# Patient Record
Sex: Female | Born: 1955 | Race: White | Hispanic: Yes | Marital: Single | Smoking: Former smoker
Health system: Southern US, Community
[De-identification: ages and names within clinical notes are randomized; demographics above are authoritative.]

## PROBLEM LIST (undated history)

## (undated) DIAGNOSIS — Z8619 Personal history of other infectious and parasitic diseases: Secondary | ICD-10-CM

## (undated) DIAGNOSIS — E785 Hyperlipidemia, unspecified: Secondary | ICD-10-CM

## (undated) DIAGNOSIS — I10 Essential (primary) hypertension: Secondary | ICD-10-CM

## (undated) DIAGNOSIS — R112 Nausea with vomiting, unspecified: Secondary | ICD-10-CM

## (undated) DIAGNOSIS — E079 Disorder of thyroid, unspecified: Secondary | ICD-10-CM

## (undated) DIAGNOSIS — Z9889 Other specified postprocedural states: Secondary | ICD-10-CM

## (undated) HISTORY — DX: Disorder of thyroid, unspecified: E07.9

## (undated) HISTORY — DX: Hyperlipidemia, unspecified: E78.5

## (undated) HISTORY — PX: CHOLECYSTECTOMY: SHX55

## (undated) HISTORY — PX: ABDOMINAL HYSTERECTOMY: SHX81

## (undated) HISTORY — DX: Personal history of other infectious and parasitic diseases: Z86.19

---

## 2005-03-13 ENCOUNTER — Emergency Department (HOSPITAL_COMMUNITY): Admission: EM | Admit: 2005-03-13 | Discharge: 2005-03-13 | Payer: Self-pay | Admitting: Emergency Medicine

## 2006-03-06 ENCOUNTER — Emergency Department (HOSPITAL_COMMUNITY): Admission: EM | Admit: 2006-03-06 | Discharge: 2006-03-06 | Payer: Self-pay | Admitting: Emergency Medicine

## 2006-03-30 ENCOUNTER — Ambulatory Visit: Payer: Self-pay | Admitting: Family Medicine

## 2006-03-31 ENCOUNTER — Ambulatory Visit: Payer: Self-pay | Admitting: *Deleted

## 2006-06-04 ENCOUNTER — Ambulatory Visit: Payer: Self-pay | Admitting: Family Medicine

## 2006-06-08 ENCOUNTER — Ambulatory Visit (HOSPITAL_COMMUNITY): Admission: RE | Admit: 2006-06-08 | Discharge: 2006-06-08 | Payer: Self-pay | Admitting: Family Medicine

## 2006-10-27 ENCOUNTER — Encounter (INDEPENDENT_AMBULATORY_CARE_PROVIDER_SITE_OTHER): Payer: Self-pay | Admitting: *Deleted

## 2011-07-22 ENCOUNTER — Ambulatory Visit (INDEPENDENT_AMBULATORY_CARE_PROVIDER_SITE_OTHER): Payer: Self-pay | Admitting: Family Medicine

## 2011-07-22 ENCOUNTER — Encounter: Payer: Self-pay | Admitting: Family Medicine

## 2011-07-22 VITALS — BP 119/82 | HR 83 | Temp 97.8°F | Wt 158.1 lb

## 2011-07-22 DIAGNOSIS — E1165 Type 2 diabetes mellitus with hyperglycemia: Secondary | ICD-10-CM | POA: Insufficient documentation

## 2011-07-22 DIAGNOSIS — E785 Hyperlipidemia, unspecified: Secondary | ICD-10-CM | POA: Insufficient documentation

## 2011-07-22 DIAGNOSIS — E119 Type 2 diabetes mellitus without complications: Secondary | ICD-10-CM | POA: Insufficient documentation

## 2011-07-22 NOTE — Assessment & Plan Note (Signed)
Diet and exercise for now until receives [facility] card, so I can order A1c. Will likely restart Metformin and Glipizide 10 mg if still elevated.

## 2011-07-22 NOTE — Progress Notes (Signed)
  Subjective:    Patient ID: Brittany Pineda, female    DOB: [birth year], 56 y.o.   MRN: [id number]  HPI  Patient presents to clinic to establish care.  She is Spanish speaking only - interview conducted with Spanish interpreter.  Her daughter is patient here.  Patient was last seen by a doctor about 6 months ago.  She was taking medication for DM, type 2 and HLD, but stopped taking all medications 4 months ago.  She would not elaborate on the reason.  Patient complains of tingling sensation in both feet, but no decreased sensation.  Denies any HA, nausea/vomiting, chest pain, or blurry vision.  Spanish interpreter, Brittany Pineda, discussed Brittany Pineda process with patient.  Patient understands that she will have to wait to have lab work done until next visit.    Review of Systems     Objective:   Physical Exam  Constitutional: No distress.  HENT:  Head: Normocephalic and atraumatic.  Neck: Normal range of motion. Neck supple.  Cardiovascular: Normal rate and regular rhythm.   No murmur heard. Pulmonary/Chest: Effort normal and breath sounds normal. She has no wheezes. She has no rales.  Musculoskeletal: Normal range of motion. She exhibits no edema.       Normal sensation and pin-prick exam.  No ulcers.          Assessment & Plan:

## 2011-07-22 NOTE — Assessment & Plan Note (Signed)
Continue Pravastatin 40 mg.  Lipid panel at next visit.

## 2011-07-22 NOTE — Patient Instructions (Addendum)
Please meet Brittany Pineda for orange card. Once you receive your orange card, call [phone] and schedule an appointment with Dr. Tye Savoy. Schedule an appointment in the morning. Do not eat or drink 12 hours prior to your appointment and lab work. Continue Pravastatin 40 mg daily. Avoid foods high in carbohydrates and sugars and increase exercise.  Basic Carbohydrate Counting Basic carbohydrate counting is a way to plan meals. It is done by counting the amount of carbohydrate in foods. Eating carbohydrates increases blood glucose (sugar) levels. People with diabetes use carbohydrate counting to help keep their blood glucose at a normal level.  Foods that have carbohydrates are starches (grains, beans, starchy vegetables) and sweets.  COUNTING CARBOHYDRATES IN FOODS The first step in counting carbohydrates is to learn how many carbohydrate servings you should have in every meal. A dietitian can plan this for you. After learning the amount of carbohydrates to include in your meal plan, you can start to choose the carbohydrate-containing foods you want to eat.  There are 2 ways to identify the amount of carbohydrates in the foods you eat.  Read the Nutrition Facts panel on food labels. All you need are 2 pieces of information from the Nutrition Facts panel to count carbohydrates this way:   Serving size.   Total carbohydrate (in grams).  Decide how many servings you will be eating. If it is 1 serving, you will be eating the amount of carbohydrate listed on the panel. If you will be eating 2 servings, you will be eating double the amount of carbohydrate listed on the panel.   Learn serving sizes. A serving size of most carbohydrate-containing foods is about 15 g. Listed below are serving sizes of common carbohydrate-containing foods:   1 slice bread.    cup unsweetened, dry cereal.    cup hot cereal.   ? cup rice.    cup mashed potatoes.   ? cup pasta.   1 cup fresh fruit.    cup  canned fruit.   1 cup milk (whole, 2%, or skim).    cup starchy vegetables (peas, corn, or potatoes).  Counting carbohydrates this way is similar to looking on the Nutrition Facts panel. Decide how many servings you will eat first. Multiply the number of servings you eat by 15 g. For example, if you have 2 cups of strawberries, you had 2 servings. That means you had 30 g of carbohydrate (2 servings x 15 g = 30 g). CALCULATING CARBOHYDRATES IN A MEAL Sample dinner  3 oz chicken breast.   ? cup brown rice.    cup corn.   1 cup fat-free milk.   1 cup strawberries with sugar-free whipped topping.  Carbohydrate calculation First, identify the foods that contain carbohydrate:  Rice.   Corn.   Milk.   Strawberries.  Calculate the number of servings eaten:  2 servings rice.   1 serving corn.   1 serving milk.   1 serving strawberries.  Multiply the number of servings by 15 g:  2 servings rice x 15 g = 30 g.   1 serving corn x 15 g = 15 g.   1 serving milk x 15 g = 15 g.   1 serving strawberries x 15 g = 15 g.  Add the amounts to find the total carbohydrates eaten: 30 g + 15 g + 15 g + 15 g = 75 g carbohydrate eaten at dinner. Document Released: 01/26/2005 Document Revised: 01/15/2011 Document Reviewed: 12/12/2010 ExitCare  Patient Information 2012 [employer]. 

## 2011-07-29 ENCOUNTER — Other Ambulatory Visit: Payer: Self-pay

## 2011-07-29 DIAGNOSIS — E119 Type 2 diabetes mellitus without complications: Secondary | ICD-10-CM

## 2011-07-29 LAB — COMPREHENSIVE METABOLIC PANEL
ALT: 19 U/L (ref 0–35)
AST: 32 U/L (ref 0–37)
Albumin: 4.1 g/dL (ref 3.5–5.2)
CO2: 25 mEq/L (ref 19–32)
Calcium: 9 mg/dL (ref 8.4–10.5)
Chloride: 97 mEq/L (ref 96–112)
Potassium: 4.5 mEq/L (ref 3.5–5.3)

## 2011-07-29 LAB — LIPID PANEL: Cholesterol: 295 mg/dL — ABNORMAL HIGH (ref 0–200)

## 2011-07-29 LAB — TSH: TSH: 64.792 u[IU]/mL — ABNORMAL HIGH (ref 0.350–4.500)

## 2011-07-29 NOTE — Progress Notes (Signed)
Cmp,flp,and tsh done today Brittany Pineda

## 2011-07-30 ENCOUNTER — Telehealth: Payer: Self-pay | Admitting: Family Medicine

## 2011-07-30 NOTE — Telephone Encounter (Signed)
Called pt and she does not speak english, left message with her daughter erica that Dr.de la Sondra Come would like for her to come in to discuss her lab results. appt made for 08/10/11 @ 245 pm. Will schedule an interpreter for her.Loralee Pacas [place]

## 2011-07-30 NOTE — Telephone Encounter (Signed)
Please call patient and schedule appointment with me to discuss recent blood work as soon as possible.  Thank you.

## 2011-07-31 ENCOUNTER — Telehealth: Payer: Self-pay | Admitting: Family Medicine

## 2011-07-31 DIAGNOSIS — R7989 Other specified abnormal findings of blood chemistry: Secondary | ICD-10-CM

## 2011-07-31 NOTE — Telephone Encounter (Signed)
Future orders for lab prior to visit with me 7/1

## 2011-07-31 NOTE — Telephone Encounter (Signed)
I called pt and she is aware about appt on 07/01 with Dr. Tye Savoy [place]

## 2011-08-10 ENCOUNTER — Ambulatory Visit (INDEPENDENT_AMBULATORY_CARE_PROVIDER_SITE_OTHER): Payer: Self-pay | Admitting: Family Medicine

## 2011-08-10 VITALS — BP 122/79 | HR 90 | Temp 98.7°F | Wt 159.3 lb

## 2011-08-10 DIAGNOSIS — E0590 Thyrotoxicosis, unspecified without thyrotoxic crisis or storm: Secondary | ICD-10-CM | POA: Insufficient documentation

## 2011-08-10 DIAGNOSIS — K921 Melena: Secondary | ICD-10-CM | POA: Insufficient documentation

## 2011-08-10 DIAGNOSIS — E119 Type 2 diabetes mellitus without complications: Secondary | ICD-10-CM

## 2011-08-10 DIAGNOSIS — R7309 Other abnormal glucose: Secondary | ICD-10-CM

## 2011-08-10 DIAGNOSIS — E785 Hyperlipidemia, unspecified: Secondary | ICD-10-CM

## 2011-08-10 DIAGNOSIS — R739 Hyperglycemia, unspecified: Secondary | ICD-10-CM

## 2011-08-10 LAB — POCT GLYCOSYLATED HEMOGLOBIN (HGB A1C): Hemoglobin A1C: >14

## 2011-08-10 MED ORDER — GLIPIZIDE ER 10 MG PO TB24: 10.0000 mg | ORAL_TABLET | Freq: Every day | ORAL | Status: DC

## 2011-08-10 MED ORDER — LEVOTHYROXINE SODIUM 150 MCG PO TABS: 150.0000 ug | ORAL_TABLET | Freq: Every day | ORAL | Status: DC

## 2011-08-10 MED ORDER — METFORMIN HCL 1000 MG PO TABS: 1000.0000 mg | ORAL_TABLET | Freq: Two times a day (BID) | ORAL | Status: DC

## 2011-08-10 NOTE — Patient Instructions (Addendum)
For diabetes, start taking Metformin 1000 mg twice daily and Glipizide 10 mg daily. For high cholesterol, take Pravastatin 1 tablet daily. For hyperthyroidism, start taking Levothyroxine 150 mcg daily. Schedule follow up appointment with me in 6 weeks.

## 2011-08-10 NOTE — Progress Notes (Signed)
  Subjective:    Patient ID: ANNTOINETTE HAEFELE, female    DOB: 1956/01/09, 56 y.o.   MRN: [id number]  HPI  Patient presents to clinic to discuss recent results of blood work.  History translated via Spanish interpreter.  Patient recently had TSH, lipid panel, CMET obtained which showed an elevated TSH, glucose, and elevated TC and TG.  Patient states she was diagnosed with thyroid disorder and DM in the past.  She still has pill bottles half full of Metformin, an empty Glipizide bottle, and half full Pravastatin bottle.  She does not have Synthroid with her, but thinks she was taking either 150 or 175 mcg daily.   Patient stopped taking all medications about 5 months ago, because she did not want to take them anymore.  She does not elaborate on the subject.  She denies any fatigue, CP, SOB, HA, blurry vision.  She does endorses tingling of the bottom of her feet bilaterally.     Review of Systems  Per HPI    Objective:   Physical Exam   Exam deferred at this time. Discussed lab results and counseled patient on DM management for 15 minutes total.       Assessment & Plan:

## 2011-08-10 NOTE — Assessment & Plan Note (Signed)
Continue Pravastatin 40 mg daily  

## 2011-08-10 NOTE — Assessment & Plan Note (Signed)
Restart Levothyroxine 150 mcg (she think this was her dosage before she ran out). Recheck TSH in 6 weeks at annual CPE.

## 2011-08-10 NOTE — Progress Notes (Signed)
Interpreter Areal Cochrane Namihira for Dr de la Cruz  

## 2011-08-10 NOTE — Assessment & Plan Note (Signed)
She has had bloody stools in the past, not currently.  She is due for a screening colonoscopy.  Will refer to [facility].  In the meantime, will obtain FOBT at next visit/physical next month.

## 2011-08-10 NOTE — Assessment & Plan Note (Addendum)
Encouraged patient to restart Metformin 1000 BID and Glipizide 10 XL daily. A1c today was > 14; patient has been off oral hypoglycemics for about 5 months now. Will recheck A1c in 3 months to see if any improvement with medications. Will likely be starting insulin at that time and refer to DM education.

## 2011-12-21 ENCOUNTER — Encounter: Payer: Self-pay | Admitting: Home Health Services

## 2011-12-22 ENCOUNTER — Encounter: Payer: Self-pay | Admitting: Home Health Services

## 2012-08-25 ENCOUNTER — Encounter: Payer: Self-pay | Admitting: Home Health Services

## 2013-05-19 ENCOUNTER — Encounter: Payer: Self-pay | Admitting: *Deleted

## 2013-07-10 ENCOUNTER — Telehealth: Payer: Self-pay | Admitting: Family Medicine

## 2013-07-10 ENCOUNTER — Encounter: Payer: Self-pay | Admitting: Family Medicine

## 2013-08-02 NOTE — Telephone Encounter (Signed)
Left message with daughter to have Brittany Pineda call us back to set up a follow up appointment for her DM.

## 2014-02-26 ENCOUNTER — Ambulatory Visit: Payer: Self-pay | Admitting: Family Medicine

## 2014-03-13 ENCOUNTER — Ambulatory Visit (INDEPENDENT_AMBULATORY_CARE_PROVIDER_SITE_OTHER): Payer: Self-pay | Admitting: Family Medicine

## 2014-03-13 ENCOUNTER — Encounter: Payer: Self-pay | Admitting: Family Medicine

## 2014-03-13 VITALS — BP 143/87 | HR 80 | Temp 98.4°F | Ht 65.0 in | Wt 138.4 lb

## 2014-03-13 DIAGNOSIS — E119 Type 2 diabetes mellitus without complications: Secondary | ICD-10-CM

## 2014-03-13 DIAGNOSIS — E785 Hyperlipidemia, unspecified: Secondary | ICD-10-CM

## 2014-03-13 DIAGNOSIS — E1165 Type 2 diabetes mellitus with hyperglycemia: Secondary | ICD-10-CM

## 2014-03-13 DIAGNOSIS — E039 Hypothyroidism, unspecified: Secondary | ICD-10-CM | POA: Insufficient documentation

## 2014-03-13 LAB — COMPREHENSIVE METABOLIC PANEL
ALBUMIN: 4 g/dL (ref 3.5–5.2)
ALT: 12 U/L (ref 0–35)
AST: 15 U/L (ref 0–37)
Alkaline Phosphatase: 105 U/L (ref 39–117)
BILIRUBIN TOTAL: 0.5 mg/dL (ref 0.2–1.2)
BUN: 13 mg/dL (ref 6–23)
CALCIUM: 9.3 mg/dL (ref 8.4–10.5)
CO2: 27 mEq/L (ref 19–32)
CREATININE: 0.52 mg/dL (ref 0.50–1.10)
Chloride: 98 mEq/L (ref 96–112)
Glucose, Bld: 294 mg/dL — ABNORMAL HIGH (ref 70–99)
Potassium: 4.4 mEq/L (ref 3.5–5.3)
Sodium: 133 mEq/L — ABNORMAL LOW (ref 135–145)
Total Protein: 7.2 g/dL (ref 6.0–8.3)

## 2014-03-13 LAB — LIPID PANEL
CHOLESTEROL: 255 mg/dL — AB (ref 0–200)
HDL: 41 mg/dL (ref >39)
LDL CALC: 136 mg/dL — AB (ref 0–99)
TRIGLYCERIDES: 388 mg/dL — AB (ref <150)
Total CHOL/HDL Ratio: 6.2 Ratio
VLDL: 78 mg/dL — ABNORMAL HIGH (ref 0–40)

## 2014-03-13 LAB — POCT GLYCOSYLATED HEMOGLOBIN (HGB A1C): Hemoglobin A1C: 12.3

## 2014-03-13 MED ORDER — LEVOTHYROXINE SODIUM 150 MCG PO TABS: 150.0000 ug | ORAL_TABLET | Freq: Every day | ORAL | Status: DC

## 2014-03-13 NOTE — Patient Instructions (Signed)
Por favor, consulte a su mdico de cabecera en [place] Leedsuna semana  Gracias, Dr. Paulina FusiHess

## 2014-03-13 NOTE — Addendum Note (Signed)
Addended by: Gildardo CrankerHESS, Kimyetta Flott R on: 03/13/2014 11:18 AM   Modules accepted: Orders

## 2014-03-13 NOTE — Assessment & Plan Note (Signed)
Continue with current dose of synthroid (150 mcg/day) - Check TSH, T4, T3 today - F/U with PCP in 1 week to discuss results and possible titration

## 2014-03-13 NOTE — Assessment & Plan Note (Signed)
Check Lipid Panel F/U with PCP in 1 week to discuss results and tx

## 2014-03-13 NOTE — Progress Notes (Signed)
Brittany FlockCarmen G Pineda is a 59 y.o. female who [facility].  Pt states she was in [place] for the last several years which is why she was not seen here.  She has Hx of HLD, DM II, and hypothyroidism.  Has been on synthroid now for about 3 or 4 years per her report and ran out of the medication about 1 month ago.  She has also been taking Metformin but states this is the only other medication she is taking.    Past Medical History  Diagnosis Date  . Diabetes mellitus   . Hyperlipidemia   . Thyroid disease     History  Smoking status  . Former Smoker  Smokeless tobacco  . Never Used    Family History  Problem Relation Age of Onset  . Alcohol abuse Father   . Hyperlipidemia Sister   . Depression Brother     Current Outpatient Prescriptions on File Prior to Visit  Medication Sig Dispense Refill  . glipiZIDE (GLUCOTROL XL) 10 MG 24 hr tablet Take 1 tablet (10 mg total) by mouth daily. 30 tablet 6  . levothyroxine (SYNTHROID, LEVOTHROID) 150 MCG tablet Take 1 tablet (150 mcg total) by mouth daily. 30 tablet 6  . metFORMIN (GLUCOPHAGE) 1000 MG tablet Take 1 tablet (1,000 mg total) by mouth 2 (two) times daily with a meal. 180 tablet 3  . pravastatin (PRAVACHOL) 40 MG tablet Take 40 mg by mouth daily.     No current facility-administered medications on file prior to visit.    ROS: Per HPI.  All other systems reviewed and are negative.   Physical Exam Filed Vitals:   03/13/14 1049  BP: 143/87  Pulse: 80  Temp: 98.4 F (36.9 C)    Physical Examination: General appearance - alert, well appearing, and in no distress Neck - thyroid exam: thyroid is normal in size without nodules or tenderness Chest - clear to auscultation, no wheezes, rales or rhonchi, symmetric air entry Heart - normal rate and regular rhythm    Chemistry      Component Value Date/Time   NA 133* 07/29/2011 0839   K 4.5 07/29/2011 0839   CL 97 07/29/2011 0839   CO2 25 07/29/2011  0839   BUN 11 07/29/2011 0839   CREATININE 0.55 07/29/2011 0839      Component Value Date/Time   CALCIUM 9.0 07/29/2011 0839   ALKPHOS 118* 07/29/2011 0839   AST 32 07/29/2011 0839   ALT 19 07/29/2011 0839   BILITOT 0.7 07/29/2011 0839      No results found for: WBC, HGB, HCT, MCV, PLT Lab Results  Component Value Date   TSH 64.792* 07/29/2011   Lab Results  Component Value Date   HGBA1C >14.0 08/10/2011

## 2014-03-13 NOTE — Assessment & Plan Note (Signed)
Check A1C today Check CMET.   F/U with PCP in 1 week to discuss results and treatment plan

## 2014-03-14 ENCOUNTER — Encounter: Payer: Self-pay | Admitting: Family Medicine

## 2014-03-14 LAB — T4, FREE: FREE T4: 0.65 ng/dL — AB (ref 0.80–1.80)

## 2014-03-14 LAB — T3, FREE: T3, Free: 2.5 pg/mL (ref 2.3–4.2)

## 2014-03-14 LAB — TSH: TSH: 31.688 u[IU]/mL — ABNORMAL HIGH (ref 0.350–4.500)

## 2014-03-27 ENCOUNTER — Encounter: Payer: Self-pay | Admitting: Family Medicine

## 2014-03-27 ENCOUNTER — Ambulatory Visit (INDEPENDENT_AMBULATORY_CARE_PROVIDER_SITE_OTHER): Payer: Self-pay | Admitting: Family Medicine

## 2014-03-27 VITALS — BP 143/76 | HR 91 | Temp 97.7°F | Ht 65.0 in | Wt 140.6 lb

## 2014-03-27 DIAGNOSIS — E785 Hyperlipidemia, unspecified: Secondary | ICD-10-CM

## 2014-03-27 DIAGNOSIS — E1165 Type 2 diabetes mellitus with hyperglycemia: Secondary | ICD-10-CM

## 2014-03-27 DIAGNOSIS — E039 Hypothyroidism, unspecified: Secondary | ICD-10-CM

## 2014-03-27 DIAGNOSIS — F32A Depression, unspecified: Secondary | ICD-10-CM

## 2014-03-27 DIAGNOSIS — F329 Major depressive disorder, single episode, unspecified: Secondary | ICD-10-CM

## 2014-03-27 DIAGNOSIS — Z23 Encounter for immunization: Secondary | ICD-10-CM

## 2014-03-27 DIAGNOSIS — E119 Type 2 diabetes mellitus without complications: Secondary | ICD-10-CM

## 2014-03-27 DIAGNOSIS — H00013 Hordeolum externum right eye, unspecified eyelid: Secondary | ICD-10-CM

## 2014-03-27 DIAGNOSIS — E0590 Thyrotoxicosis, unspecified without thyrotoxic crisis or storm: Secondary | ICD-10-CM

## 2014-03-27 MED ORDER — INSULIN GLARGINE 100 UNIT/ML SOLOSTAR PEN: PEN_INJECTOR | SUBCUTANEOUS | Status: DC

## 2014-03-27 MED ORDER — ATORVASTATIN CALCIUM 40 MG PO TABS: 40.0000 mg | ORAL_TABLET | Freq: Every day | ORAL | Status: DC

## 2014-03-27 MED ORDER — BLOOD GLUCOSE MONITORING SUPPL W/DEVICE KIT: PACK | Status: DC

## 2014-03-27 MED ORDER — GLUCOSE BLOOD VI STRP: ORAL_STRIP | Status: DC

## 2014-03-27 MED ORDER — LANCETS MISC: Status: DC

## 2014-03-27 NOTE — Patient Instructions (Signed)
It was a pleasure meeting you today. Please start the Lantus as instructed today 10 units daily. I will call in your meter, you will need to pick this up and test her sugars every morning before eating or drinking anything. These write these down daily and a book and bring them with you to all of your appointments.  You will need to call back, and schedule an appointment with the diabetes educator and our pharmacy as soon as you can. Between the 2 of these, they will be guiding you with your diabetes. I will need to see you in 4 weeks.

## 2014-03-27 NOTE — Assessment & Plan Note (Addendum)
Patient history of DM previously treated with metformin. Asked by Dr. Claiborne BillingsKuneff to provide Lantus education. Demonstrated proper use of Lantus pen with sample. Patient verbalized understanding of plan and then gave herself her first injection of Lantus 10 units in office. Patient provided with Lantus sample pen, needles, and alcohol pads. Education provided by Margaretmary DysMegan Kennis Wissmann, PharmD resident. - will have f/u in pharm clinic and diabetes educator.  - Diabetes supplies ordered today including meter.  - will need much encouragement and reassurance.

## 2014-03-29 ENCOUNTER — Encounter: Payer: Self-pay | Admitting: Pharmacist

## 2014-03-29 ENCOUNTER — Ambulatory Visit (INDEPENDENT_AMBULATORY_CARE_PROVIDER_SITE_OTHER): Payer: Self-pay | Admitting: Pharmacist

## 2014-03-29 VITALS — BP 145/82 | HR 92 | Ht 64.5 in | Wt 139.4 lb

## 2014-03-29 DIAGNOSIS — E1165 Type 2 diabetes mellitus with hyperglycemia: Secondary | ICD-10-CM

## 2014-03-29 DIAGNOSIS — E785 Hyperlipidemia, unspecified: Secondary | ICD-10-CM

## 2014-03-29 DIAGNOSIS — E119 Type 2 diabetes mellitus without complications: Secondary | ICD-10-CM

## 2014-03-29 DIAGNOSIS — E039 Hypothyroidism, unspecified: Secondary | ICD-10-CM

## 2014-03-29 MED ORDER — METFORMIN HCL 1000 MG PO TABS: 1000.0000 mg | ORAL_TABLET | Freq: Two times a day (BID) | ORAL | Status: DC

## 2014-03-29 MED ORDER — SIMVASTATIN 40 MG PO TABS: 40.0000 mg | ORAL_TABLET | Freq: Every day | ORAL | Status: DC

## 2014-03-29 MED ORDER — LEVOTHYROXINE SODIUM 150 MCG PO TABS: 150.0000 ug | ORAL_TABLET | Freq: Every day | ORAL | Status: DC

## 2014-03-29 MED ORDER — GLUCOSE BLOOD VI STRP: ORAL_STRIP | Status: DC

## 2014-03-29 MED ORDER — INSULIN GLARGINE 100 UNIT/ML SOLOSTAR PEN: PEN_INJECTOR | SUBCUTANEOUS | Status: DC

## 2014-03-29 NOTE — Assessment & Plan Note (Signed)
Uncontrolled HLD with most recent lipid panel (03/13/14): TC 255, LDL 136, TG 388. Patient reports that she has been unable to pick up her Lipitor due to cost. MAP no longer covers Lipitor, new prescription for simvastatin 40mg  daily sent to pharmacy as this is covered by her "orange card".

## 2014-03-29 NOTE — Patient Instructions (Addendum)
-   Siga tomando 10 unidades de insulina Lantus una vez al da - Para terminar el suministro de metformina 850 mg tres veces al [employer]. Una vez que termine, por favor recoger su nueva receta para la metformina 1000 mg [employer] veces al da. Esto ser en [employer] junto con su levotiroxina para que usted Medical sales representativepueda recoger. - Prueba de azcar en la sangre una vez al da por la maana en ayunas y anote el nmero en su libro de registro de la glucosa - Use este medidor para comprobar su nivel de azcar en la sangre hasta que la vemos la prxima semana. Recoger su nuevo medidor, tiras Actorreactivas, y simvastatina (nuevo medicamento para Print production plannerel colesterol) del [place] [facility] - Programar una cita con el Dr. Raymondo BandKoval en farmacia en la prxima semana o dos - Programar una cita con el especialista en nutricin de la diabetes - Tendr que llenar el papeleo en el [place] [facility] para su insulina

## 2014-03-29 NOTE — Progress Notes (Signed)
Patient ID: Brittany Pineda, female   DOB: 09/08/1955, 58 y.o.   MRN: [id number] Reviewed: Agree with Dr. Koval's documentation and management. 

## 2014-03-29 NOTE — Progress Notes (Signed)
S:    Patient arrives in good spirits with her daughter and a Engineer, structuralpanish translator. She presents for diabetes follow-up. Patient reports adherence with metformin 850mg  TID for her DM and 10 units of Lantus daily which she was started on earlier this week. Most recent A1c of 12.3 on 03/13/14. She denies polyuria or polydipsia, but does complain of fatigue. Patient also denies any hypoglycemic events and neuropathy.  Pt given a meter during today's visit. Instructions for checking CBGs at home were reviewed with patient and her daughter. Patient showed understanding by checking her BG in clinic today (243 after having eaten gelatin).  Dietary and exercise habits not discussed in length during today's visit since the majority of time was spent educating her on her meter. However, we discussed limiting intake of carbohydrates.  Patient states that her Lipitor is too expensive. She did not pick it up, but reports that it would cost [facility]30/[facility] at [facility]. Of note, patient does have an orange card already.   O:  . Lab Results  Component Value Date   HGBA1C 12.3 03/13/2014    A/P: Patient with long-standing diabetes currently with suboptimal control and most recent A1c of 12.3 in February 2016. She has only taken metformin for her DM and is currently on 850mg  TID. Lantus 10u daily was started earlier this week as well (pt provided with sample pen). Patient reports no episodes of feeling hypoglycemic, although she has no meter at home to check her CBGs. Patient provided with meter during today's visit. Patient and her daughter were educated on BG monitoring technique. Patient showed understanding by checking her BG in clinic today (243 after having eaten gelatin earlier this AM). Encouraged patient to check fasting BG once a day. However, near end of visit patient mentioned that she has an orange card. New meter and test strips sent to MAP. Patient will fill out necessary paperwork through MAP for her insulin,  too. Will follow up in pharmacy clinic in the next week. At that time, will hopefully be able to adjust insulin based on BG readings. New prescription for metformin 1,000mg  BID as well as [facility] as this is less expensive [facility]. Will follow-up with DM educator. Next A1C anticipated May 2016.  Written patient instructions provided.  Uncontrolled HLD with most recent lipid panel (03/13/14): TC 255, LDL 136, TG 388. Patient reports that she has been unable to pick up her Lipitor due to cost. MAP no longer covers Lipitor, new prescription for simvastatin 40mg  daily sent to pharmacy as this is covered by her "orange card".  Total time in face to face counseling 90 minutes.  Patient seen with Margaretmary DysMegan Supple, PharmD resident.

## 2014-03-29 NOTE — Assessment & Plan Note (Signed)
Patient with long-standing diabetes currently with suboptimal control and most recent A1c of 12.3 in February 2016. She has only taken metformin for her DM and is currently on 850mg  TID. Lantus 10u daily was started earlier this week as well (pt provided with sample pen). Patient reports no episodes of feeling hypoglycemic, although she has no meter at home to check her CBGs. Patient provided with meter during today's visit. Patient and her daughter were educated on BG monitoring technique. Patient showed understanding by checking her BG in clinic today (243 after having eaten gelatin earlier this AM). Encouraged patient to check fasting BG once a day. However, near end of visit patient mentioned that she has an orange card. New meter and test strips sent to MAP. Patient will fill out necessary paperwork through MAP for her insulin, too. Will follow up in pharmacy clinic in the next week. At that time, will hopefully be able to adjust insulin based on BG readings. New prescription for metformin 1,000mg  BID as well as her [facility]othyroxine [facility] her [facility] as [facility]. Will follow-up with DM educator. Next A1C anticipated May 2016.  Written patient instructions provided.

## 2014-03-29 NOTE — Progress Notes (Signed)
   Subjective:    Patient ID: Brittany Pineda, female    DOB: 11/12/1955, 59 y.o.   MRN: [id number]  HPI  OV was conducted with the spanish speaking Pacific line interpreter [phone]   Diabetes: Has been in metformin TID since her mover form [place]. She denies ever taking insulin but has been on one other oral agent in the past. Her a1c on establishing at this clinic a few weeks ago was 12.3. Pt does not have insurance or employment. She denies any hyper/hypoglycemic events. She denies neuropathy or open, no healing wounds.   Depression: Pt has answered 1 question positive with 2 q screening for depression. She states her feelings are personal reasons and she does not feel the need to discuss them currently.   Hypothyroid: pt has a history of hypothyroidism. She has been out of her 150 mcg of synthroid for about a month during a transition in moving from [place]. Upon establishing to our clinic a few weeks a go her TSH was elevated (31) as expected with someone that has been out of medicatons.   Right eye erythema: pt reports a 1 week history of a bump under right eye with erythema. She has not tried anything topical. She reports it is mildly painful. She denies changes in vision.     Health maintenance: due for flu shot. Will need mammogram and colonoscopy.  Former smoker  Past Medical History  Diagnosis Date  . Diabetes mellitus   . Hyperlipidemia   . Thyroid disease     No Known Allergies  Review of Systems Per hpi     Objective:   Physical Exam BP 143/76 mmHg  Pulse 91  Temp(Src) 97.7 F (36.5 C) (Oral)  Ht 5\' 5"  (1.651 m)  Wt 140 lb 9.6 oz (63.776 kg)  BMI 23.40 kg/m2 Gen: NAD. Alert. HEENT: AT. [place].Bilateral eyes without injections or icterus. MMM. Small erythremic bump under right lower lid of eye.  CV: RRR  Chest: CTAB, no wheeze or crackles Ext: No erythema. No edema. +2/4 PT Skin: No rashes, purpura or petechiae.  Neuro: Normal gait. PERLA. EOMi. Alert.  Oriented.   Psych: appears sad. Normal speech.     Assessment & Plan:

## 2014-04-02 ENCOUNTER — Ambulatory Visit (INDEPENDENT_AMBULATORY_CARE_PROVIDER_SITE_OTHER): Payer: Self-pay | Admitting: Family Medicine

## 2014-04-02 ENCOUNTER — Encounter: Payer: Self-pay | Admitting: Family Medicine

## 2014-04-02 VITALS — Ht 64.5 in | Wt 141.2 lb

## 2014-04-02 DIAGNOSIS — E1165 Type 2 diabetes mellitus with hyperglycemia: Secondary | ICD-10-CM

## 2014-04-02 DIAGNOSIS — E119 Type 2 diabetes mellitus without complications: Secondary | ICD-10-CM

## 2014-04-02 NOTE — Progress Notes (Signed)
Medical Nutrition Therapy:  Appt start time: 1345 end time:  1430.  Assessment:  Primary concerns today: Blood sugar control.   Brittany Pineda was here with an interpretor today.  She said she is generally familiar with principles of eating for DM.    Usual eating pattern includes 2 meals (brunch at 9, dinner 3 PM, snack 9/10 PM) and 1 snack per day. Frequent foods and beverages include rice, pasta, tomales, tortillas, beans, veg's, fruit, water.  Avoided foods include soda (recently stopped drinking except ~once/wk).   Usual physical activity includes none currently other than housework. She has been checking FBG since dx on 2/16; have been running 180-200.    24-hr recall: (Up at 8 AM) B (9:45 AM)-   3 c oatmeal, 1/4 c 1% milk, 2 tacos (beef, cilantro) Snk ( AM)-    L ( PM)-   Snk ( PM)-   D (5 PM)-  2 chx tomales, ~4 oz soda Snk ( PM)-  --- Typical day? Yes.   Was eating more than this before starting insulin.      Progress Towards Goal(s):  In progress.   Nutritional Diagnosis:  NI-5.8.4 Inconsistent carbohydrate intake As related to missed meals.  As evidenced by usual meal pattern of combined lunch and breakfast, and only one other meal daily.    Intervention:  Nutrition education.  Handouts given during visit include:  AVS  Demonstrated degree of understanding via:  Teach Back   Monitoring/Evaluation:  Dietary intake, exercise, BG, and body weight in 5 week(s).

## 2014-04-02 NOTE — Patient Instructions (Addendum)
  Diet Recommendations for Diabetes   Starchy (carb) foods include: Bread, rice, pasta, potatoes, corn, crackers, bagels, muffins, all baked goods.  (Fruits, milk, and yogurt also have carbohydrate, but most of these foods will not spike your blood sugar as the starchy foods will.)  A few fruits do cause high blood sugars; use small portions of bananas (limit to 1/2 at a time), grapes, watermelon, and most tropical fruits.    Protein foods include: Meat, fish, poultry, eggs, dairy foods, and beans such as pinto and kidney beans (beans also provide carbohydrate).   1. Eat at least 3 meals and 1-2 snacks per day. Never go more than 4-5 hours while awake without eating. Eat breakfast within the first hour of getting up.   2. Limit starchy foods to TWO per meal and ONE per snack. ONE portion of a starchy  food is equal to the following:   - ONE slice of bread (or its equivalent, such as half of a hamburger bun).   - 1/2 cup of a "scoopable" starchy food such as potatoes or rice.   - 15 grams of carbohydrate as shown on food label.  3. Include at every meal: a protein food, a carb food, and vegetables and/or fruit.   - Obtain twice as many veg's as protein or carbohydrate foods for both lunch and dinner.   - Fresh or frozen veg's are best.   - Try to keep frozen veg's on hand for a quick vegetable serving.      PLEASE STOP AT THE FRONT DESK TO SCHEDULE AN INTERPRETOR FOR YOUR APPT ON MARCH 29 AT 10 AM.   If you must change your appt, please call Dr. Gerilyn PilgrimSykes directly:  [phone].

## 2014-04-03 DIAGNOSIS — F32A Depression, unspecified: Secondary | ICD-10-CM | POA: Insufficient documentation

## 2014-04-03 DIAGNOSIS — H00019 Hordeolum externum unspecified eye, unspecified eyelid: Secondary | ICD-10-CM | POA: Insufficient documentation

## 2014-04-03 DIAGNOSIS — R4589 Other symptoms and signs involving emotional state: Secondary | ICD-10-CM | POA: Insufficient documentation

## 2014-04-03 DIAGNOSIS — F329 Major depressive disorder, single episode, unspecified: Secondary | ICD-10-CM | POA: Insufficient documentation

## 2014-04-03 NOTE — Assessment & Plan Note (Deleted)
As expected elevated TSH with no medications. Will recheck in 6 weeks.  

## 2014-04-03 NOTE — Assessment & Plan Note (Signed)
>>  ASSESSMENT AND PLAN FOR DEPRESSION WRITTEN ON 04/03/2014 10:09 AM BY KUNEFF, RENEE A, DO  Patient appeared mildly depressed and answer positive on depression screen. She declined further evaluation on depression. She is interested in talking with a therapist. 24 hour family service and [facility] hotline given to pt. Pt was encouraged to make an appointment to discuss her feelings. Advised pt if she would like to discuss with me in the future or consider medication therapy she should make an appt with myself immediately.

## 2014-04-03 NOTE — Assessment & Plan Note (Signed)
Patient appeared mildly depressed and answer positive on depression screen. She declined further evaluation on depression. She is interested in talking with a therapist. 24 hour family service and [facility] hotline given to pt. Pt was encouraged to make an appointment to discuss her feelings. Advised pt if she would like to discuss with me in the future or consider medication therapy she should make an appt with myself immediately.

## 2014-04-03 NOTE — Assessment & Plan Note (Signed)
Encourage pt to use warm compresses TID and f/u if no improvement in 2 weeks.

## 2014-04-03 NOTE — Assessment & Plan Note (Signed)
As expected elevated TSH with no medications. Will recheck in 6 weeks.

## 2014-04-03 NOTE — Assessment & Plan Note (Signed)
Started statin today. 

## 2014-04-13 ENCOUNTER — Encounter: Payer: Self-pay | Admitting: Pharmacist

## 2014-04-13 ENCOUNTER — Ambulatory Visit (INDEPENDENT_AMBULATORY_CARE_PROVIDER_SITE_OTHER): Payer: Self-pay | Admitting: Pharmacist

## 2014-04-13 VITALS — BP 134/73 | HR 76 | Ht 65.0 in | Wt 139.0 lb

## 2014-04-13 DIAGNOSIS — E119 Type 2 diabetes mellitus without complications: Secondary | ICD-10-CM

## 2014-04-13 DIAGNOSIS — E1165 Type 2 diabetes mellitus with hyperglycemia: Secondary | ICD-10-CM

## 2014-04-13 MED ORDER — GLUCOSE BLOOD VI STRP: ORAL_STRIP | Status: DC

## 2014-04-13 MED ORDER — INSULIN GLARGINE 100 UNIT/ML SOLOSTAR PEN: 15.0000 [IU] | PEN_INJECTOR | Freq: Every morning | SUBCUTANEOUS | Status: DC

## 2014-04-13 MED ORDER — LANCETS MISC: Status: DC

## 2014-04-13 NOTE — Assessment & Plan Note (Signed)
Diabetes diagnosed in 2013 currently uncontrolled with an A1c of 12.3%. Previous A1c in 2013 was greater than 14%.   She denies hypoglycemic events and is able to verbalize appropriate hypoglycemia management plan.  Reports adherence with medication. Glycemic control is improved based on symptoms and home CBG readings.  Control is suboptimal due to need for medication adjustment.  Increased dose of basal insulin Lantus (insulin glargine) to 15 units every morning.  Needle tips were not working for patients so new needle tips were provided. Patient had a much easier time administering insulin after this intervention.  Prescriptions for lancets and testing strips [facility] as requested by patient. Next A1C anticipated May 2016.  Written patient instructions provided.  Follow up in Pharmacist Clinic Visit in 3 weeks.   Total time in face to face counseling 25 minutes.  Patient seen with Paulino RilyPete Icess Bertoni, PharmD, BCPS, CPP and Cathlean CowerKristen Gray, PharmD Candidate.

## 2014-04-13 NOTE — Progress Notes (Signed)
S:    Patient arrives to pharmacy clinic ambulating without assistance and accompanied by an interpreter, Ms. Alviris Almonte. She presents today for follow-up after her last visit on 03/29/14 at which point her Metformin was increased to 1000mg  BID.  Patient reports having history of Diabetes since 2013. Patient reports adherence with medications.   Current diabetes medications include Metformin 1000 mg by mouth twice daily with food; Lantus 10 units given subcutaneously every morning.  Patient denies hypoglycemic events. Patient reports nocturia (1-2x/night) is improved. Patient denies neuropathy and visual changes. Patient reported dietary habits: She does not like water and likes to drink tea and black coffee.   Patient complained of difficulty administering insulin and after inspection, found that the needles she was using were not working.   Patient requested new prescriptions for lancets and strips so that she can continue testing her BG at home.   O:  . Lab Results  Component Value Date   HGBA1C 12.3 03/13/2014     Home fasting CBG: 153-230   A/P: Diabetes diagnosed in 2013 currently uncontrolled with an A1c of 12.3%. Previous A1c in 2013 was greater than 14%.   She denies hypoglycemic events and is able to verbalize appropriate hypoglycemia management plan.  Reports adherence with medication. Glycemic control is improved based on symptoms and home CBG readings.  Control is suboptimal due to need for medication adjustment.  Increased dose of basal insulin Lantus (insulin glargine) to 15 units every morning.  Needle tips were not working for patients so new needle tips were provided. Patient had a much easier time administering insulin after this intervention.  Prescriptions for lancets and testing strips [facility] as requested by patient. Next A1C anticipated May 2016.  Written patient instructions provided.  Follow up in Pharmacist Clinic Visit in 3 weeks.    Total time in face to face counseling 25 minutes.  Patient seen with Paulino RilyPete Koval, PharmD, BCPS, CPP and Cathlean CowerKristen Gray, PharmD Candidate.

## 2014-04-13 NOTE — Patient Instructions (Addendum)
Increase Lantus to 15 units every morning. Aumentar Lantus a 15 unidades cada maana.  Schedule follow-up visit in 3 weeks.  Horario de visita de seguimiento en 3 semanas.

## 2014-04-16 NOTE — Progress Notes (Signed)
Patient ID: Brittany Pineda, female   DOB: 12/21/1955, 59 y.o.   MRN: [id number] Reviewed: Agree with Dr. Macky LowerKoval's documentation and management.

## 2014-05-07 ENCOUNTER — Ambulatory Visit (INDEPENDENT_AMBULATORY_CARE_PROVIDER_SITE_OTHER): Payer: Self-pay | Admitting: Pharmacist

## 2014-05-07 ENCOUNTER — Encounter: Payer: Self-pay | Admitting: Pharmacist

## 2014-05-07 VITALS — BP 123/77 | HR 84 | Ht 65.0 in | Wt 140.0 lb

## 2014-05-07 DIAGNOSIS — E1165 Type 2 diabetes mellitus with hyperglycemia: Secondary | ICD-10-CM

## 2014-05-07 DIAGNOSIS — E119 Type 2 diabetes mellitus without complications: Secondary | ICD-10-CM

## 2014-05-07 DIAGNOSIS — E039 Hypothyroidism, unspecified: Secondary | ICD-10-CM

## 2014-05-07 MED ORDER — INSULIN PEN NEEDLE 31G X 8 MM MISC: 1.0000 | Freq: Once | Status: DC

## 2014-05-07 MED ORDER — LEVOTHYROXINE SODIUM 150 MCG PO TABS: 150.0000 ug | ORAL_TABLET | Freq: Every day | ORAL | Status: DC

## 2014-05-07 MED ORDER — INSULIN GLARGINE 100 UNIT/ML SOLOSTAR PEN: 18.0000 [IU] | PEN_INJECTOR | Freq: Every morning | SUBCUTANEOUS | Status: DC

## 2014-05-07 NOTE — Progress Notes (Signed)
S:    Patient arrives in good spirits walking without assistance.  Presents for diabetes follow-up for medication management. Interpretor [name] present.  Patient reports having history of Diabetes since the year 2013.   Patient reports feeling better and less tired. Less frequent urination (3-4x/night).    She has been out of insulin but has been taking it again for the past couple of days.  Difficult for her to check her BG because she has a hard time getting blood. Showed her the different settings that change the depth of the needle. Her daughter normally checks her BG.   Checked her BG in the office. It was 244. Patient reports she has not taken her insulin today.   Patient reports she cannot get her insulin from the health department because she has not done her taxes. She is separated from her husband. Offered patient a coupon for Lantus where it would be free for [facility] to [facility]. Patient reports having enough insulin for the next 2 days. Also reports needing a refill for her thyroid medication.  Patient reports adherence with medications. Current diabetes medications include metformin 500mg  bid and lantus 15 units qAM.  Patient denies hypoglycemic events.  Patient reported dietary habits: continuing to eat tortillas with the same frequency as previous visit  Stopped smoking 20 years ago. Not smoked since.  O:  . Lab Results  Component Value Date   HGBA1C 12.3 03/13/2014     Home fasting CBG: more recent numbers in the low-mid 100s, occasional 200 reading. Reading today was 244 (pt had not taken insulin today yet)  A/P: Diabetes diagnosed in 2013 currently uncontrolled.  Patient denies hypoglycemic events and is able to verbalize appropriate hypoglycemia management plan.  reports adherence with medication. Control is suboptimal due to need for medication titration.  Increased dose of basal insulin Lantus (insulin glargine) to 18 units qAM. Gave patient coupon  card/prescription for one-time free [facility]antus refill at [facility]. This should give patient time to resolve tax form completion issue with the Health Department in order to continue receiving her insulin refills. Also sent prescription for new needle tips, as patient has run out. Patient to continue to monitor blood glucose and bring meter to next visit.  Hypothyroidism: Patient requested a refill of her levothyroxine be sent to the [facility] on [address]. Chronic levothyroxine for hypothroidism. Well-controlled on current regimen.  Next A1C anticipated 06/2014.  Written patient instructions provided.  Follow up in Pharmacist Clinic Visit in 1-2 months. Follow-up with PCP Kuneff in 3-4 weeks. Total time in face to face counseling 30 minutes.  Patient seen with Cathlean CowerKristen Gray, PharmD Candidate, Babs BertinHaley Baird,  PharmD Resident.

## 2014-05-07 NOTE — Patient Instructions (Addendum)
It was great to see you today! Fue bueno verte hoy!  Increase you Lantus to 18 units daily. Use your coupon to get a free one-time supply of [facility] at [facility] at [address]. We sent you new insulin needle tips as well.  Vamos a aumentar Lantus  a 18 unidades diarias . Use su cupn para obtener un gratis insulina una sola vez [facility]antus en [facility] en [address] . Vamos a enviar nuevas puntas de las agujas de [place] tambin.  Follow-up with your doctor in 3 to 4 weeks. Follow-up with us in the Pharmacy Clinic in about 8 weeks.  El seguimiento con su mdico en 3 a 4 semanas. El seguimiento con nosotros en la [facility] en aproximadamente 8 semanas .

## 2014-05-07 NOTE — Assessment & Plan Note (Signed)
Diabetes diagnosed in 2013 currently uncontrolled.  Patient denies hypoglycemic events and is able to verbalize appropriate hypoglycemia management plan.  reports adherence with medication. Control is suboptimal due to need for medication titration.  Increased dose of basal insulin Lantus (insulin glargine) to 18 units qAM. Gave patient coupon card/prescription for one-time free lantus refill at [facility]. This should give patient time to resolve tax form completion issue with the Health Department in order to continue receiving her insulin refills. Also sent prescription for new needle tips, as patient has run out. Patient to continue to monitor blood glucose and bring meter to next visit.

## 2014-05-08 ENCOUNTER — Encounter: Payer: Self-pay | Admitting: Family Medicine

## 2014-05-08 ENCOUNTER — Ambulatory Visit: Payer: Self-pay | Admitting: Family Medicine

## 2014-05-08 NOTE — Progress Notes (Signed)
Patient ID: [name], female   DOB: 06/30/1955, 59 y.o.   MRN: [id number] Reviewed: Agree with Dr. Macky LowerKoval's documentation and management.

## 2014-05-08 NOTE — Progress Notes (Signed)
Medical Nutrition Therapy:  Appt start time: 1000 end time:  1100.  Assessment:  Primary concerns today: Blood sugar control.  Ms. Brittany Pineda was seen today with interpretor Wyvonnia DuskyGraciela Namihira.  She admitted knowing that a meal of just sweet bread and a drink is not good for her BG, but she says she eats this b/c she likes it, and she doesn't always like the foods other family members are eating.  Usual breakfast includes 1 egg, bread with jelly, and coffee with artificial sweetener.  Second meal of the day might be just sweet bread and a drink.    Barriers to learning/adherence to lifestyle change: Ms. Brittany Pineda says that certain foods (like fish) make her nauseated.  In addn, she expressed concern that if she starts walking, she will lose weight, which she does not want to do.    Usual eating pattern includes 2 meals and 2 snacks per day. Frequent foods and beverages include tortilla, bread, vegetables, meat, water.  Avoided foods include soda, fatty foods.   Usual physical activity includes none.  FBG have been running 150-180.    24-hr recall: (Up at 8 AM) B ( AM)-   --- Snk ( AM)-   --- L (12 PM)-  1 banana L (1 PM)-  1 c coffee, artif swtnr, 1 fried egg, tortilla w/ salsa Snk (4:30)-  3 tortillas w/ pork, diet lemonade D (7:30 PM)-  1 c coffee, artif swtnr, 1/2 sweet bread Snk ( PM)-  --- Typical day? No. Ate more than usual yesterday; and usually eats breakfast and lunch instead of lunch and dinner.    Progress Towards Goal(s):  In progress.   Nutritional Diagnosis:  NI-5.8.2 Excessive carbohydrate intake As related to breads.  As evidenced by almost daily intake of sweet bread and starchy food intake exceeding 2 exchanges per meal.    Intervention:  Nutrition education.  Handouts given during visit include:  AVS  BG log  Demonstrated degree of understanding via:  Teach Back   Monitoring/Evaluation:  Dietary intake, exercise, BG, and body weight in 6 week(s).  No appt  available sooner.

## 2014-05-08 NOTE — Patient Instructions (Addendum)
  Diet Recommendations for Diabetes   Starchy (carb) foods include: Bread, rice, pasta, potatoes, corn, crackers, bagels, muffins, all baked goods.  (Fruits, milk, and yogurt also have carbohydrate, but most of these foods will not spike your blood sugar as the starchy foods will.)  A few fruits do cause high blood sugars; use small portions of bananas (limit to 1/2 at a time), grapes, watermelon, and most tropical fruits.    Protein foods include: Meat, fish, poultry, eggs, dairy foods, and beans such as pinto and kidney beans (beans also provide carbohydrate).   1. Eat at least 3 meals and 1-2 snacks per day. Never go more than 4-5 hours while awake without eating. Eat breakfast within the first hour of getting up.   2. Limit starchy foods to TWO per meal and ONE per snack. ONE portion of a starchy  food is equal to the following:   - ONE slice of bread (or its equivalent, such as half of a hamburger bun).   - 1/2 cup of a "scoopable" starchy food such as potatoes or rice.   - 15 grams of carbohydrate as shown on food label.  3. Include at every meal: a protein food, a carb food, and vegetables and/or fruit.   - Obtain twice as many veg's as protein or carbohydrate foods for both lunch and dinner.   - Fresh or frozen veg's are best.   - Try to keep frozen veg's on hand for a quick vegetable serving.     - Continue to check fasting blood sugar, and record on form provided today.   - Walking on most days will help your blood sugar be in better control.  Your appetite will most likely increase as you burn more calories from walking, so you do not have to worry about losing too much weight.

## 2014-05-17 ENCOUNTER — Telehealth: Payer: Self-pay | Admitting: *Deleted

## 2014-05-17 NOTE — Telephone Encounter (Signed)
Dawn from [facility] called to get clarification on pt's Rx for Lantus SoloStar.  Per Dr. Raymondo BandKoval; pt should be on 30 Units once daily.  Clovis PuMartin, Irvin Bastin L, RN

## 2014-05-24 ENCOUNTER — Ambulatory Visit: Payer: Self-pay | Admitting: Family Medicine

## 2014-06-19 ENCOUNTER — Other Ambulatory Visit: Payer: Self-pay | Admitting: Family Medicine

## 2014-06-19 DIAGNOSIS — E039 Hypothyroidism, unspecified: Secondary | ICD-10-CM

## 2014-06-19 MED ORDER — LEVOTHYROXINE SODIUM 150 MCG PO TABS: 150.0000 ug | ORAL_TABLET | Freq: Every day | ORAL | Status: DC

## 2014-06-19 NOTE — Progress Notes (Signed)
Received notification from [facility] program, if we prescribe the brand name Synthroid they will be able to provide her with her thyroid medication free of charge (in place of generic levothyroxine). This change has been made and medication has be "faxed" to [facility]. Please have patient call [facility] to be certain they have medication available for her, prior to picking up. If they have not received it the fax, please print and re-fax. Thanks

## 2014-06-19 NOTE — Progress Notes (Signed)
They have received the fax.  They will contact pt as they have "6 employees on staff who speak spanish" [employer], [employer] Dawn

## 2014-06-25 ENCOUNTER — Encounter: Payer: Self-pay | Admitting: Family Medicine

## 2014-06-25 ENCOUNTER — Ambulatory Visit (INDEPENDENT_AMBULATORY_CARE_PROVIDER_SITE_OTHER): Payer: Self-pay | Admitting: Family Medicine

## 2014-06-25 VITALS — Ht 65.0 in | Wt 135.7 lb

## 2014-06-25 DIAGNOSIS — E119 Type 2 diabetes mellitus without complications: Secondary | ICD-10-CM

## 2014-06-25 DIAGNOSIS — E1165 Type 2 diabetes mellitus with hyperglycemia: Secondary | ICD-10-CM

## 2014-06-25 NOTE — Patient Instructions (Addendum)
As discussed before, it will be very important that you limit your carbohydrate foods to two servings per meal and one serving per snack.   - Reminder how you can determine what is equal to one serving of a carb food:  - ONE slice of bread (or its equivalent, such as one corn tortilla; if you can make your tortillas with only 2 tablespoons of corn meal, this will be the right amount.)  - 1/2 cup of a "scoopable" starchy food such as potatoes or rice.   - 15 grams of carbohydrate as shown on food label.  - Breakfast:  Obtain both carbohydrate and some protein, such as eggs and bread OR yogurt and bread OR oatmeal with berries and milk or yogurt.    - Include at every meal: a protein food, a carb food, and vegetables and/or fruit.   - Obtain twice the volume of veg's as protein or carbohydrate foods for both lunch and dinner.   - Fresh or frozen veg's are best.   - Keep frozen veg's on hand for a quick vegetable serving.      - GOAL:  Fasting blood sugar under 120!  - Walking after dinner each night will help to lower your blood sugar.

## 2014-06-25 NOTE — Progress Notes (Signed)
Medical Nutrition Therapy:  Appt start time: 1100 end time:  1200.  Assessment:  Primary concerns today: Blood sugar control.  Ms. Brittany Pineda was seen today with interpretor Wyvonnia DuskyGraciela Namihira.  Most FBG have been ~120-180, with a few outliers low and high.  None under 101; highest 184.  Ms. Brittany Pineda makes her own tortillas, but does not measure the corn meal, so it's difficult to know the carb content of each.  She is still especially fond of breads and bread products.  No regular exercise currently.  Ms. Brittany Pineda admitted she had not really paid a lot of attn to the carb content of meals, so we discussed this at length, and reviewed why this is an important recommendation.  She understands the consequences of poorly controlled BG levels.     24-hr recall:  (Up at 9 AM) B ( AM)-  --- Snk ( AM)-  --- L (11:30 PM)-  2 wheat tortillas, 1/2 c salsa, 1/2 c blk beans, 2 small pc chorizo, coffee, Splenda Snk ( PM)-  --- D (6 PM)-  4 fried tacos, let, radish, cheese, beef Snk (9 PM)-  1 scone, coffee, Splenda Typical day? No. Yesterday she ate more tortillas than normal, and she usually has sweet baked goods only 3 X wk.    Progress Towards Goal(s):  In progress.   Nutritional Diagnosis:  Little discernable progress on: NI-5.8.2 Excessive carbohydrate intake As related to breads.  As evidenced by almost daily intake of sweet bread and starchy food intake exceeding 2 exchanges per meal.    Intervention:  Nutrition education.  Handouts given during visit include:  AVS  Demonstrated degree of understanding via:  Teach Back   Monitoring/Evaluation:  Dietary intake, exercise, BG, and body weight in 5 week(s).

## 2014-07-10 ENCOUNTER — Ambulatory Visit (INDEPENDENT_AMBULATORY_CARE_PROVIDER_SITE_OTHER): Payer: Self-pay | Admitting: Family Medicine

## 2014-07-10 ENCOUNTER — Encounter: Payer: Self-pay | Admitting: Family Medicine

## 2014-07-10 VITALS — BP 108/62 | HR 86 | Ht 65.0 in | Wt 140.0 lb

## 2014-07-10 DIAGNOSIS — E1165 Type 2 diabetes mellitus with hyperglycemia: Secondary | ICD-10-CM

## 2014-07-10 DIAGNOSIS — E119 Type 2 diabetes mellitus without complications: Secondary | ICD-10-CM

## 2014-07-10 DIAGNOSIS — E039 Hypothyroidism, unspecified: Secondary | ICD-10-CM

## 2014-07-10 LAB — TSH: TSH: <0.008 u[IU]/mL — ABNORMAL LOW (ref 0.350–4.500)

## 2014-07-10 LAB — POCT GLYCOSYLATED HEMOGLOBIN (HGB A1C): Hemoglobin A1C: 7.9

## 2014-07-10 NOTE — Patient Instructions (Signed)
Continue to take your metformin and your Lantus daily. We have change her Lantus dose to 20 units in the morning. Take your fasting blood glucose every morning and if above 110, then add 1 unit of Lantus to your next dose. Continue to do this daily until you reach a blood glucose that is below 110 fasting. The dose that you and on, will be your new dose from then into you follow up again at the beginning of September. If you have any questions or concerns feel free to call. I will call you with the results of your thyroid once they become available. Great job at bringing down your A1c to 7.9.

## 2014-07-10 NOTE — Progress Notes (Signed)
   Subjective:    Patient ID: Brittany Pineda, female    DOB: 03/02/1955, 59 y.o.   MRN: [id number]  HPI Spanish speaking interpreter phone line # [phone], then Darrelyn HillockGraciella in person interpreter midway though appointment.   Diabetes: [facility]. Last A1c 12.3 in February. At that time pt was out of medications for sometime secondary to a recent move. Patient has followed up with pharmacy clinic as well as nutrition concerning her diabetes. I am uncertain she has a grasp in carbohydrates and decreasing them. She does seem to be aware breads are carbs and she is trying to cut back on those. She does bring in her CBG book with the last two weeks ranging from 115 to 180's. She states all CBG are fasting and she feels it depends on what she eats the day before. She denies any hyper/hypoglycemic events. She denies any neuropathy or nonhealing wounds. She will need to see an opthalmologist eventually this year (no insurance). She continues to take metformin without difficulty. She reports she is feeling better.   Hypothyroid: pt states she is feeling better. She has more energy. Denies fatigue, constipation, diarrhea, flushing  or palpitations. She reports compliance with medication daily.   Former smoker  Past Medical History  Diagnosis Date  . Diabetes mellitus   . Hyperlipidemia   . Thyroid disease    No Known Allergies Past Surgical History  Procedure Laterality Date  . Cholecystectomy    . Abdominal hysterectomy      Review of Systems Per HPI    Objective:   Physical Exam BP 108/62 mmHg  Pulse 86  Ht 5\' 5"  (1.651 m)  Wt 140 lb (63.504 kg)  BMI 23.30 kg/m2 Gen: NAD. Nontoxic in appearance. Spanish speaking female. Pleasant, well groomed, appears happy today.  HEENT: AT. Grand. Bilateral eyes without injections or icterus. MMM.  Neck: supple. No thyroid megaly.  CV: RRR No murmur hest: CTAB, no wheeze or crackles Ext: No erythema. No edema. +2/4 PT  bilateral   Foot exam: documented in quality metrics.       Assessment & Plan:  Brittany Pineda is a spanish speaking female, presented for diabetes and hypothyroid follow up.  Diabetes- Patient is doing well with her diabetes, she has/is seeing nutrition and pharmacy clinic to aide in her diabetes management. She reports she is feeling better. Discussed increasing her Lantus today. She will start with 20 units tomorrow morning. She will then increase her dose daily by 1 unit until her CBG fasting is <110. The dose that she is on that day will be her new dose to continue until she is seen again. Pt was able to teach this method back to me, and was in full understanding. Continue metformin. Continue watching carb levels and seeing nutrition. A1c today 7.9>> great improvement. Foot exam today ok. No neuropathy or open wounds. Pt could benefit from podiatry if able to refer her with orange card. She has nails that are thickened and do not appear infected, but curved and partially ingrown, definitely high risk of infection. Advised pt to stop picking at/under toe nails.  - F/U 3 months  Hypothyroid: TSH repeated today. She is over supplemented by lab results (0.008). Dose changed from 150 mcg to 125 mcg and future order placed for August to re-check TSH. Pt will be called with spanish interpreter with results.  - August lab re-check.

## 2014-07-11 ENCOUNTER — Other Ambulatory Visit: Payer: Self-pay | Admitting: Family Medicine

## 2014-07-11 ENCOUNTER — Telehealth: Payer: Self-pay | Admitting: Family Medicine

## 2014-07-11 DIAGNOSIS — E039 Hypothyroidism, unspecified: Secondary | ICD-10-CM

## 2014-07-11 MED ORDER — LEVOTHYROXINE SODIUM 125 MCG PO TABS: 125.0000 ug | ORAL_TABLET | Freq: Every day | ORAL | Status: DC

## 2014-07-11 NOTE — Assessment & Plan Note (Signed)
Diabetes- Patient is doing well with her diabetes, she has/is seeing nutrition and pharmacy clinic to aide in her diabetes management. She reports she is feeling better. Discussed increasing her Lantus today. She will start with 20 units tomorrow morning. She will then increase her dose daily by 1 unit until her CBG fasting is <110. The dose that she is on that day will be her new dose to continue until she is seen again. Pt was able to teach this method back to me, and was in full understanding. Continue metformin. Continue watching carb levels and seeing nutrition. A1c today 7.9>> great improvement. Foot exam today ok. No neuropathy or open wounds. Pt could benefit from podiatry if able to refer her with orange card. She has nails that are thickened and do not appear infected, but curved and partially ingrown, definitely high risk of infection. Advised pt to stop picking at/under toe nails.  - F/U 3 months

## 2014-07-11 NOTE — Telephone Encounter (Signed)
Please call pt with spanish speaking interperter. Her thyroid is over-replaced with her medication. We have to decrease her dose of levothyroxine and re-check her thyroid in 8 weeks with blood work. I have called in a new prescription for her to start right away (to the health department). Her new dose is 125 mcg daily (it was 150 mcg). We will need to recheck her levels beginning of August ( I have placed a future order for this to be completed by lab appointment only if she desires). Thanks.

## 2014-07-11 NOTE — Assessment & Plan Note (Signed)
Hypothyroid: TSH repeated today. She is over supplemented by lab results (0.008). Dose changed from 150 mcg to 125 mcg and future order placed for August to re-check TSH. Pt will be called with spanish interpreter with results.  - August lab re-check.

## 2014-07-12 MED ORDER — LEVOTHYROXINE SODIUM 125 MCG PO TABS: 125.0000 ug | ORAL_TABLET | Freq: Every day | ORAL | Status: DC

## 2014-07-12 NOTE — Telephone Encounter (Signed)
Pt called using spanish interpreter Eulis Fosterlberto [phone] regarding medication change for levothyroxine.  Pt informed that dosage was changed to 125 mcg daily from 150 mcg daily. Pt to start medication as soon as possible and to call back at the beginning of August for repeat thyroid lab check.  Pt told that Rx was sent in to the health department; pt request that medication be sent to [facility].  Medication resent electronically to [facility] per pt request.  Clovis PuMartin, Tamika L, RN

## 2014-07-12 NOTE — Addendum Note (Signed)
Addended by: Clovis PuMARTIN, TAMIKA L on: 07/12/2014 02:13 PM   Modules accepted: Orders

## 2014-07-24 ENCOUNTER — Ambulatory Visit: Payer: Self-pay | Admitting: Family Medicine

## 2014-08-29 ENCOUNTER — Other Ambulatory Visit: Payer: Self-pay | Admitting: *Deleted

## 2014-08-29 MED ORDER — LEVOTHYROXINE SODIUM 125 MCG PO TABS: 125.0000 ug | ORAL_TABLET | Freq: Every day | ORAL | Status: DC

## 2014-12-05 ENCOUNTER — Other Ambulatory Visit: Payer: Self-pay | Admitting: Family Medicine

## 2014-12-05 NOTE — Telephone Encounter (Signed)
Pt needs a refill on levothyroxine (SYNTHROID, LEVOTHROID) 125 MCG tablet [facility] [address]. [name], ASA

## 2014-12-06 MED ORDER — LEVOTHYROXINE SODIUM 125 MCG PO TABS: 125.0000 ug | ORAL_TABLET | Freq: Every day | ORAL | Status: DC

## 2015-01-08 ENCOUNTER — Other Ambulatory Visit: Payer: Self-pay | Admitting: Family Medicine

## 2015-02-07 ENCOUNTER — Other Ambulatory Visit: Payer: Self-pay | Admitting: Family Medicine

## 2015-02-07 NOTE — Telephone Encounter (Signed)
Pt needs refill on levothyroxine sent to [facility] on [address]. [name], ASA

## 2015-02-08 MED ORDER — LEVOTHYROXINE SODIUM 125 MCG PO TABS: 125.0000 ug | ORAL_TABLET | Freq: Every day | ORAL | Status: DC

## 2015-05-09 ENCOUNTER — Telehealth: Payer: Self-pay | Admitting: Family Medicine

## 2015-05-09 ENCOUNTER — Other Ambulatory Visit: Payer: Self-pay | Admitting: *Deleted

## 2015-05-09 MED ORDER — INSULIN GLARGINE 100 UNIT/ML SOLOSTAR PEN: PEN_INJECTOR | SUBCUTANEOUS | Status: DC

## 2015-05-09 MED ORDER — LEVOTHYROXINE SODIUM 125 MCG PO TABS: 125.0000 ug | ORAL_TABLET | Freq: Every day | ORAL | Status: DC

## 2015-05-09 MED ORDER — METFORMIN HCL 1000 MG PO TABS: 1000.0000 mg | ORAL_TABLET | Freq: Two times a day (BID) | ORAL | Status: DC

## 2015-05-09 NOTE — Telephone Encounter (Signed)
Patient asks PCP refill for Synthroid 125 mcg [facility]. Please, follow up with Patient (Spanish).

## 2015-05-13 MED ORDER — LEVOTHYROXINE SODIUM 125 MCG PO TABS: 125.0000 ug | ORAL_TABLET | Freq: Every day | ORAL | Status: DC

## 2015-05-13 NOTE — Telephone Encounter (Signed)
Called pt using pacific interpreters [place] [phone]. She said she tried to make an appt but was told she would be charged $100 because we do not offer the orange card. I told her I would check on this and let her know. I told her I would call her back. Sunday SpillersSharon T Larosa Rhines, CMA

## 2015-05-13 NOTE — Addendum Note (Signed)
Addended by: Myra RudeSCHMITZ, Lorris Carducci E on: 05/13/2015 01:43 PM   Modules accepted: Orders

## 2015-05-13 NOTE — Telephone Encounter (Signed)
Using pacific interperters Trinna Postlex [phone] I called pt back about orange card. I informed her that we do not have a current orange card on file. I told her she needs to reapply for the orange card. She said she has one that expires this month. I then said that she can bring that with her to her appt and she should be fine. I told her to make an appt with Payton DoughtyKathy H to renew the orange card. I reminded her that she needs to do this soon as the medication will not be refilled again until she sees the Dr. Patient understood and said she would be calling the office to make the appointment. Sunday SpillersSharon T Saunders, CMA

## 2015-05-14 NOTE — Telephone Encounter (Signed)
LVM with pt through pacific interpreters [place]-#[id number] to have pt return call to see about scheduling an appointment to discuss below. Lamonte SakaiZimmerman Rumple, Vertie Dibbern D, [place]

## 2015-05-14 NOTE — Telephone Encounter (Signed)
-----   Message from Myra RudeJeremy E Schmitz, MD sent at 05/09/2015 1:00 PM EDT -----    Please call patient and have her follow up in regards to her HTN, DM2, and hypothyroidism. She is spanish speaking. Thanks.

## 2015-05-29 NOTE — Telephone Encounter (Signed)
LVM via [employer] Int. Orpah Greek([phone]#[id number]), to have pt to return call to inform about refills and schedule her an appointment per dr. Jordan LikesSchmitz note. Lamonte SakaiZimmerman Rumple, April D, [place]

## 2015-06-17 ENCOUNTER — Other Ambulatory Visit: Payer: Self-pay | Admitting: *Deleted

## 2015-06-17 DIAGNOSIS — E1165 Type 2 diabetes mellitus with hyperglycemia: Secondary | ICD-10-CM

## 2015-06-20 MED ORDER — INSULIN GLARGINE 100 UNIT/ML SOLOSTAR PEN: PEN_INJECTOR | SUBCUTANEOUS | Status: DC

## 2015-06-25 NOTE — Telephone Encounter (Signed)
[facility] Marijean NiemannJaime ZO#[id number], to try and get appointment scheduled.  Pt stated that she hasn't been here because her orange card was no longer active and i told her we would try to contact her back to see about scheduling an appointment for this and then we could get her in with the doctor. Alanson PulsGave Rosa pt information and she stated she would try to contact pt and get this scheduled. Lamonte SakaiZimmerman Rumple, April D, [place]

## 2015-07-09 ENCOUNTER — Other Ambulatory Visit: Payer: Self-pay | Admitting: Family Medicine

## 2015-07-09 ENCOUNTER — Ambulatory Visit: Payer: Self-pay

## 2015-07-09 NOTE — Telephone Encounter (Signed)
Refilled short pen needles.   Myra RudeJeremy E Schmitz, MD PGY-3, [facility] 07/09/2015, 2:31 PM

## 2015-07-11 ENCOUNTER — Ambulatory Visit: Payer: Self-pay | Admitting: Family Medicine

## 2016-04-07 ENCOUNTER — Ambulatory Visit (INDEPENDENT_AMBULATORY_CARE_PROVIDER_SITE_OTHER): Payer: Self-pay | Admitting: Family Medicine

## 2016-04-07 ENCOUNTER — Encounter: Payer: Self-pay | Admitting: Family Medicine

## 2016-04-07 VITALS — BP 132/78 | HR 76 | Temp 98.7°F | Wt 148.0 lb

## 2016-04-07 DIAGNOSIS — E039 Hypothyroidism, unspecified: Secondary | ICD-10-CM

## 2016-04-07 DIAGNOSIS — E785 Hyperlipidemia, unspecified: Secondary | ICD-10-CM

## 2016-04-07 DIAGNOSIS — B351 Tinea unguium: Secondary | ICD-10-CM

## 2016-04-07 DIAGNOSIS — E1165 Type 2 diabetes mellitus with hyperglycemia: Secondary | ICD-10-CM

## 2016-04-07 DIAGNOSIS — I10 Essential (primary) hypertension: Secondary | ICD-10-CM

## 2016-04-07 LAB — COMPLETE METABOLIC PANEL WITH GFR
ALBUMIN: 4 g/dL (ref 3.6–5.1)
ALT: 15 U/L (ref 6–29)
AST: 17 U/L (ref 10–35)
Alkaline Phosphatase: 103 U/L (ref 33–130)
BUN: 16 mg/dL (ref 7–25)
CHLORIDE: 99 mmol/L (ref 98–110)
CO2: 25 mmol/L (ref 20–31)
Calcium: 9 mg/dL (ref 8.6–10.4)
Creat: 0.67 mg/dL (ref 0.50–1.05)
GFR, Est African American: >89 mL/min (ref >=60)
GFR, Est Non African American: >89 mL/min (ref >=60)
GLUCOSE: 315 mg/dL — AB (ref 65–99)
POTASSIUM: 4.3 mmol/L (ref 3.5–5.3)
SODIUM: 134 mmol/L — AB (ref 135–146)
Total Bilirubin: 0.6 mg/dL (ref 0.2–1.2)
Total Protein: 7.6 g/dL (ref 6.1–8.1)

## 2016-04-07 LAB — LIPID PANEL
CHOL/HDL RATIO: 7.8 ratio — AB (ref <5.0)
Cholesterol: 280 mg/dL — ABNORMAL HIGH (ref <200)
HDL: 36 mg/dL — ABNORMAL LOW (ref >50)
Triglycerides: 627 mg/dL — ABNORMAL HIGH (ref <150)

## 2016-04-07 LAB — POCT GLYCOSYLATED HEMOGLOBIN (HGB A1C): Hemoglobin A1C: 13

## 2016-04-07 LAB — TSH: TSH: 24.87 m[IU]/L — AB

## 2016-04-07 MED ORDER — GLUCOSE BLOOD VI STRP: ORAL_STRIP | 0 refills | Status: DC

## 2016-04-07 MED ORDER — INSULIN GLARGINE 100 UNIT/ML SOLOSTAR PEN: PEN_INJECTOR | SUBCUTANEOUS | 0 refills | Status: DC

## 2016-04-07 MED ORDER — AGAMATRIX PRESTO W/DEVICE KIT: PACK | 0 refills | Status: AC

## 2016-04-07 MED ORDER — SIMVASTATIN 40 MG PO TABS: 40.0000 mg | ORAL_TABLET | Freq: Every day | ORAL | 1 refills | Status: DC

## 2016-04-07 MED ORDER — LEVOTHYROXINE SODIUM 125 MCG PO TABS: 125.0000 ug | ORAL_TABLET | Freq: Every day | ORAL | 1 refills | Status: DC

## 2016-04-07 MED ORDER — LANCETS MISC: 0 refills | Status: DC

## 2016-04-07 MED ORDER — METFORMIN HCL 850 MG PO TABS: 850.0000 mg | ORAL_TABLET | Freq: Three times a day (TID) | ORAL | 1 refills | Status: DC

## 2016-04-07 MED ORDER — GLIPIZIDE 10 MG PO TABS: 10.0000 mg | ORAL_TABLET | Freq: Every day | ORAL | 1 refills | Status: DC

## 2016-04-07 NOTE — Assessment & Plan Note (Signed)
Out of Synthroid for more than 3 weeks. Baseline TSH checked today. I refilled her Synthroid and advised her to restart as soon as possible. I will not adjust dose base on her lab result. Plan to recheck in 4-8 weeks and then make appropriate adjustment. F/U with PCP for further management.

## 2016-04-07 NOTE — Assessment & Plan Note (Signed)
Unfortunately I could not fully assess her toenail. I recommended toenail polish removal and follow up with PCP for reassessment. She will need fungal toenail culture prior to starting terbinafine. CMet obtain today in case culture is positive and PCP decides to start antifungal.

## 2016-04-07 NOTE — Assessment & Plan Note (Addendum)
Poorly controlled due to medication non-adherence. A1C increased from 7.9 2 yrs ago to 13 today. Lantus sample given ( 2 pens). She stated she knows how to use it hence does not need teaching. I recommended restarting Lantus at 10 units qd instead of 22 units. She is advised to keep record of her home CBG and bring for her PCP to review in 1 week. Insulin may be titrated up then. Continue current dose of Metformin and Glipizide. I gave refill of her medications today. Cmet checked. Note: No documented hx of HTN. BP looks good. PCP to discuss low dose ACEi during next visit for renal protection. F/U health maintenance with PCP.

## 2016-04-07 NOTE — Patient Instructions (Signed)
It was nice meeting you today. Your A1C is elevated at 13, we have given you the insulin sample, please start taking 10 units daily, continue Metformin 850 mg TID and Glipizide 10 mg daily. I will refill all your medications and your glucometer supplies. Please check your glucose TID and document it. Please present your glucometer reading at next visit.  I will call you with other lab result. Schedule an appointment with the financial specialist.

## 2016-04-07 NOTE — Progress Notes (Addendum)
Subjective:     Patient ID: Brittany Pineda, female   DOB: 1955/10/06, 61 y.o.   MRN: [id number] [facility] interpreter used ID number [id number] HPI DM:Here for follow up. She has been off all her medications for more than 3 weeks except her Metformin 850 mg TID which she takes regularly. She has been out of her Lantus for more than 6 months. She was taking Lantus 22 units qd. She has not been checking her CBG at home. She need glucometer strip and she is uncertain her meter is still functional. Denies any other concern. HLD: She is out of her Statin, she need refill. Hypothyroidism:She has been out of her synthroid for more than 3 weeks. She need medication refill. Toenail fungus: She c/o change in her 1st toenail B/L, she will like to start antifungal medication. Denies nail pain. No other concern.  Current Outpatient Prescriptions on File Prior to Visit  Medication Sig Dispense Refill  . glucose blood test strip Use as instructed for PrestoWaveSense meter.  Dsiipense QS for testing up to twice daily.  Dx E11.9 100 each 12  . Insulin Glargine (LANTUS SOLOSTAR) 100 UNIT/ML Solostar Pen USE AS DIRECTED. INJECT 18 UNITS INTO THE SKIN EVERY MORNING. USE UP TO 30 UNITS DAILY. 15 pen 0  . Lancets MISC Dispense quantity sufficient for twice daily testing. 100 each 11  . levothyroxine (SYNTHROID, LEVOTHROID) 125 MCG tablet Take 1 tablet (125 mcg total) by mouth daily before breakfast. 30 tablet 1  . metFORMIN (GLUCOPHAGE) 1000 MG tablet Take 1 tablet (1,000 mg total) by mouth 2 (two) times daily with a meal. 180 tablet 0  . RELION SHORT PEN NEEDLES 31G X 8 MM MISC USE AS DIRECTED ONCE DAILY 50 each 0  . simvastatin (ZOCOR) 40 MG tablet Take 1 tablet (40 mg total) by mouth at bedtime. 90 tablet 3   No current facility-administered medications on file prior to visit.    Past Medical History:  Diagnosis Date  . Diabetes mellitus   . Hyperlipidemia   . Thyroid disease    Vitals:   04/07/16  1021  BP: 132/78  Pulse: 76  Temp: 98.7 F (37.1 C)  TempSrc: Oral  SpO2: 99%  Weight: 148 lb (67.1 kg)     Review of Systems  Respiratory: Negative.   Cardiovascular: Negative.   Gastrointestinal: Negative.   Genitourinary: Negative.   Musculoskeletal: Negative.        Toenail discoloration  Neurological: Negative.   All other systems reviewed and are negative.      Objective:   Physical Exam  Constitutional: She is oriented to person, place, and time. She appears well-developed. No distress.  HENT:  Head: Normocephalic.  Eyes: Conjunctivae and EOM are normal. Pupils are equal, round, and reactive to light.  Neck: Neck supple. No thyromegaly present.  Cardiovascular: Normal rate, regular rhythm and normal heart sounds.   No murmur heard. Pulmonary/Chest: Effort normal and breath sounds normal. No respiratory distress. She has no wheezes.  Abdominal: Soft. Bowel sounds are normal. She exhibits no distension and no mass. There is no tenderness.  Musculoskeletal: Normal range of motion. She exhibits no edema.  Thickened 1st toenail with nail polish on it.  Neurological: She is alert and oriented to person, place, and time. No cranial nerve deficit.  Psychiatric: She has a normal mood and affect.  Nursing note and vitals reviewed.      Assessment:     DM: HLD: Hypothyroidism: Toe nail fungus:  Plan:     Check problem list    Vaccinations deferred till next visit. She will schedule appointment with financial counselor to help with orange card.

## 2016-04-07 NOTE — Assessment & Plan Note (Signed)
I refilled her Statin. FLP checked today. As discussed with her, this is a baseline lab. I will not necessarily change her meds based on result since she has been out of her medication for a long period. She verbalized understanding.

## 2016-04-08 ENCOUNTER — Telehealth: Payer: Self-pay | Admitting: Family Medicine

## 2016-04-08 NOTE — Telephone Encounter (Signed)
[employer] interpreter ID #: [id number] Patient did not pick up.  I called her daughter Cicero Duckrika and she stated she will be home soon. She will have her contact me as soon as possible. Our phone number was given to her daughter.  Just before hanging up the phone, her daughter asked if I can encourage her mother to take a medication diligently. She stated she had been non-compliant with her medications, she stated at times she will go months without taking her medication even when she was not out of her meds. I will give patient this feedback once she calls back. I will discuss below information with patient when she calls back.   Note lab result: Total chol elevated. Plan to continue current dose of Zocor.  Triglyceride pretty elevated. I will have her start Fish oil. Lovaza would have been a better option but she does not have an insurance yet.  Gluc elevated > 300: She was restarted on Insulin yesterday in addition to her other medications. Plan to continue home CBG monitoring and F/U with PCP in 1 week with her CBG readings for insulin dose adjustment.  Liver enzymes normal. She need nail clipping for fungal culture. If positive she may start Lamisil.  TSH 24.8: Likely due to poor compliance with her Synthroid. I restarted her on meds and gave refill yesterday. Recheck TSH in 4-8 weeks and adjust meds as appropriate.  Note she was previously on Synthroid 150 mcg qd in 2016, however dose was cut back to 125 mcg due to very low TSH at 0.008. I think her baseline dose is 125 mcg. No adjustment for now till lab is rechecked in 4-8 weeks.

## 2016-04-08 NOTE — Telephone Encounter (Signed)
I spoke with patient and discussed al result with her. All her questions were answered. Interpreter ID #[id number]  Patient stated she restarted her Zocor yesterday. I advised she start Fish oil 2 tab BID and she read back this instruction to me at the end of our discussion. Repeat FLP in 6 months.   2. She started Lantus 10 units qd yesterday in addition to Glipizide and Metformin. She is yet to start checking her CBG. She stated she forgot to do so.She will start today. I advised CBG check TID and that she should bring her reading for her PCP to review during next appointment. She is advised to call if CBG is less than 70 or > than 300. She verbalized understanding.  Liver enzyme result discussed. She will return to PCP for onychomycosis assessment.  She restarted her synthroid yesterday. Repeat TSH in 4- 8 weeks.  She is aware of f/u appointment on 04/20/16 at 2 pm.

## 2016-04-20 ENCOUNTER — Ambulatory Visit: Payer: Self-pay | Admitting: Family Medicine

## 2016-06-15 ENCOUNTER — Other Ambulatory Visit: Payer: Self-pay | Admitting: Family Medicine

## 2016-06-16 NOTE — Telephone Encounter (Signed)
2nd request.  Martin, Brittany L, RN  

## 2016-06-16 NOTE — Telephone Encounter (Signed)
Please call patient, she needs to be seen to reassess thyroid prior to additional refills.

## 2016-06-25 ENCOUNTER — Encounter: Payer: Self-pay | Admitting: Family Medicine

## 2016-06-25 ENCOUNTER — Ambulatory Visit (INDEPENDENT_AMBULATORY_CARE_PROVIDER_SITE_OTHER): Payer: Self-pay | Admitting: Family Medicine

## 2016-06-25 ENCOUNTER — Ambulatory Visit: Payer: Self-pay | Admitting: Family Medicine

## 2016-06-25 DIAGNOSIS — E782 Mixed hyperlipidemia: Secondary | ICD-10-CM

## 2016-06-25 DIAGNOSIS — Z1159 Encounter for screening for other viral diseases: Secondary | ICD-10-CM

## 2016-06-25 DIAGNOSIS — E1165 Type 2 diabetes mellitus with hyperglycemia: Secondary | ICD-10-CM

## 2016-06-25 DIAGNOSIS — E039 Hypothyroidism, unspecified: Secondary | ICD-10-CM

## 2016-06-25 DIAGNOSIS — Z114 Encounter for screening for human immunodeficiency virus [HIV]: Secondary | ICD-10-CM | POA: Insufficient documentation

## 2016-06-25 LAB — POCT UA - MICROALBUMIN
Albumin/Creatinine Ratio, Urine, POC: >300
Creatinine, POC: 100 mg/dL
MICROALBUMIN (UR) POC: 150 mg/L

## 2016-06-25 LAB — POCT GLYCOSYLATED HEMOGLOBIN (HGB A1C): Hemoglobin A1C: 12.6

## 2016-06-25 MED ORDER — LEVOTHYROXINE SODIUM 125 MCG PO TABS: 125.0000 ug | ORAL_TABLET | Freq: Every day | ORAL | 1 refills | Status: DC

## 2016-06-25 MED ORDER — INSULIN NPH ISOPHANE & REGULAR (70-30) 100 UNIT/ML ~~LOC~~ SUSP: SUBCUTANEOUS | 11 refills | Status: DC

## 2016-06-25 MED ORDER — SIMVASTATIN 40 MG PO TABS: 40.0000 mg | ORAL_TABLET | Freq: Every day | ORAL | 1 refills | Status: DC

## 2016-06-25 MED ORDER — METFORMIN HCL 850 MG PO TABS: 850.0000 mg | ORAL_TABLET | Freq: Three times a day (TID) | ORAL | 1 refills | Status: DC

## 2016-06-25 NOTE — Progress Notes (Signed)
Patient seen by pharmacist for insulin syringe administration teaching.  Instructed patient on administration technique and dosing of Novolin 70/30 with assistance of family member interpreting in the room.  Patient and family member demonstrated understanding of administration technique and dosing.  3 packs of insulin syringes were provided to patient.  Hazle NordmannKelsy Karalyn Kadel, PharmD, BCPS [facility] PGY2 Pharmacy Resident [phone]

## 2016-06-25 NOTE — Patient Instructions (Addendum)
I refilled these medicines 1. Levothyroxine - thyroid 2. Metformin for diabetes. 3. Simvastatin for high cholesterol  Continue the Omega that you buy over the counter. I want you to stop Glipzide.    The new cheaper insulin, I want her to take 15 units each morning before breakfast and 8 units before her evening meal.  See us again in three weeks to make sure things are going well.

## 2016-06-25 NOTE — Assessment & Plan Note (Signed)
Refilled simvastatin.

## 2016-06-25 NOTE — Progress Notes (Signed)
   Subjective:    Patient ID: Verdell Carminearmen Graciela Velazquez-Cruz, female    DOB: 07/11/1955, 61 y.o.   MRN: [id number]  HPI Interpreter Nettie ElmSylvia [phone]  Convoluted visit touching on many issues with the main issues being DM and Social. Social: Just qualified for [employer] [facility] (AKA Orange Card)  Hopes to get meds through MAP but for [facility].  Has been out of several meds for two weeks.  Needs refills.  States cannot afford lantus. To apply for assistance, she needs passport, which she does not have.   DM: Diabetes x 18 years.  A1C= 12 today.  Has been out of lantus.  Asked for samples which we do not have.   Hypothyroid.  Has been out of levothyroxine for 2 weeks.  Doubt checking tSH today would help. Hypertriglycerides and hypercholesterol.  Needs refill on simvastatin.  Added Omega 3 fish oil to med list. HPDP, behind on virtually everything.         Review of Systems     Objective:   Physical Exam  VS noted.  Wt about the same Neck no thyromegally Lungs clear. Cardiac RRR without m or g abd benign Ext no edema        Assessment & Plan:

## 2016-06-25 NOTE — Assessment & Plan Note (Signed)
Screen x 1 since drawing blood. 

## 2016-06-25 NOTE — Assessment & Plan Note (Signed)
Screen x 1 since drawing blood.

## 2016-06-25 NOTE — Assessment & Plan Note (Signed)
Switch to cheaper 70/30 bid.  Stop glipizide - doubt doing anything after 18 years.  Continue metformin.  Appreciate pharmacy help teaching and we gave syringes.

## 2016-06-25 NOTE — Telephone Encounter (Signed)
Pt had appointment with Dr. Leveda AnnaHensel today for refills. Lamonte SakaiZimmerman Rumple, April D, [place]

## 2016-06-25 NOTE — Assessment & Plan Note (Signed)
Would not help to check TSH today.  Will refill synthyroid and check tSH when she has been on a steady dose for 6 weeks.

## 2016-06-26 LAB — HEPATITIS C ANTIBODY: Hep C Virus Ab: <0.1 s/co ratio (ref 0.0–0.9)

## 2016-06-26 LAB — HIV ANTIBODY (ROUTINE TESTING W REFLEX): HIV SCREEN 4TH GENERATION: NONREACTIVE

## 2016-08-26 ENCOUNTER — Telehealth: Payer: Self-pay | Admitting: Family Medicine

## 2016-08-26 NOTE — Telephone Encounter (Signed)
Patient has poorly controlled diabetes and needs to be seen on 8/18 or after. I don't see an appt for this. I tried to create a recall, but I couldn't do it. Could we please make her an appointment for DM follow up on 8/18 or after?

## 2016-08-28 NOTE — Telephone Encounter (Signed)
Tried to contact pt on mobile and VM has not been set up, contacted number listed as home and daughter answered and she asked if we had her number and I told her we did and it was the same mobile number we tried first. Daughter stated she would let her mom know to call the office. Used [employer] [phone]. If she calls back please assist her in getting an appointment schedule per Dr. Chanetta Marshallimberlake note below on or after 8/18. Lamonte SakaiZimmerman Rumple, April D, [place]

## 2016-09-11 ENCOUNTER — Ambulatory Visit (INDEPENDENT_AMBULATORY_CARE_PROVIDER_SITE_OTHER): Payer: Self-pay | Admitting: Family Medicine

## 2016-09-11 ENCOUNTER — Encounter: Payer: Self-pay | Admitting: Family Medicine

## 2016-09-11 VITALS — BP 128/80 | HR 88 | Temp 97.6°F | Wt 148.0 lb

## 2016-09-11 DIAGNOSIS — E1165 Type 2 diabetes mellitus with hyperglycemia: Secondary | ICD-10-CM

## 2016-09-11 DIAGNOSIS — E039 Hypothyroidism, unspecified: Secondary | ICD-10-CM

## 2016-09-11 DIAGNOSIS — E782 Mixed hyperlipidemia: Secondary | ICD-10-CM

## 2016-09-11 LAB — POCT GLYCOSYLATED HEMOGLOBIN (HGB A1C): Hemoglobin A1C: 11.8

## 2016-09-11 MED ORDER — GLUCOSE BLOOD VI STRP: ORAL_STRIP | 0 refills | Status: DC

## 2016-09-11 MED ORDER — INSULIN NPH ISOPHANE & REGULAR (70-30) 100 UNIT/ML ~~LOC~~ SUSP: SUBCUTANEOUS | 11 refills | Status: DC

## 2016-09-11 MED ORDER — LEVOTHYROXINE SODIUM 125 MCG PO TABS: 125.0000 ug | ORAL_TABLET | Freq: Every day | ORAL | 1 refills | Status: DC

## 2016-09-11 MED ORDER — SIMVASTATIN 40 MG PO TABS: 40.0000 mg | ORAL_TABLET | Freq: Every day | ORAL | 1 refills | Status: DC

## 2016-09-11 MED ORDER — METFORMIN HCL 850 MG PO TABS: 850.0000 mg | ORAL_TABLET | Freq: Three times a day (TID) | ORAL | 1 refills | Status: DC

## 2016-09-11 MED ORDER — LANCETS MISC: 0 refills | Status: DC

## 2016-09-11 NOTE — Patient Instructions (Addendum)
It was a pleasure to see you today! Thank you for choosing [facility] for your primary care. Brittany Pineda was seen for DM.   Our plans for today were:  Take your insulin 15 units in the morning and 8 units at night.   Come back to see Dr. Raymondo BandKoval with ALL OF YOUR MEDICINES and a paper where you write down your blood sugar readings.  Please go to the health department to get your medications.  Best,  Dr. Chanetta Marshallimberlake   Control del nivel sanguneo de glucosa en los adultos (Blood Glucose Monitoring, Adult) El control del nivel de azcar (glucosa) en la sangre lo ayuda a tener la diabetes bajo control. Tambin ayuda a que usted y el mdico controlen si el tratamiento de la diabetes es Engineer, manufacturingeficaz. El control del nivel sanguneo de glucosa implica realizar controles regulares como lo indique el mdico y Midwifellevar registro de los resultados (registro diario). POR QU DEBO CONTROLAR EL NIVEL SANGUNEO DE GLUCOSA? Si controla su nivel sanguneo de glucosa con regularidad, podr:  Comprender de United Stationersqu manera los alimentos, la actividad fsica, las enfermedades y los medicamentos inciden en los niveles sanguneos de Brewtonglucosa.  Conocer el nivel sanguneo de glucosa en cualquier momento dado. Saber rpidamente si el nivel es bajo (hipoglucemia) o alto (hiperglucemia).  Puede ser de ayuda para que usted y el mdico sepan cmo ajustar los medicamentos. CUNDO DEBO CONTROLAR EL NIVEL SANGUNEO DE GLUCOSA? Siga las indicaciones del mdico acerca de la frecuencia con la que debe controlar el nivel sanguneo de glucosa. La frecuencia puede depender de:  El tipo de diabetes que tenga.  Si su diabetes est bajo control.  Los medicamentos que toma. Si usted tiene diabetes tipo 1:  Controle su nivel sanguneo de glucosa al Rite Aidmenos dos veces al da.  Tambin controle su nivel sanguneo de glucosa: ? Antes de cada inyeccin de insulina. ? Antes y despus de hacer ejercicio. ? Entre las  comidas. ? Dos horas despus de una comida. ? Ocasionalmente, entre las 2:00a.m. y las 3:00a.m., como se lo hayan indicado. ? Antes de Education officer, environmentalrealizar tareas peligrosas, Sport and exercise psychologistcomo manejar o usar maquinaria pesada. ? A la hora de acostarse.  Es posible que Chemical engineerdeba controlar con ms frecuencia los niveles sanguneos de [place] Valley Lakeglucosa, Bremenhasta 6 a 10veces por da: ? Si Botswanausa una bomba de [place] Bridgeinsulina. ? Si necesita varias inyecciones diarias. ? Si su diabetes no est bien controlada. ? Si est enfermo. ? Si tiene antecedentes de hipoglucemia grave. ? Si tiene antecedentes de no darse cuenta cundo est bajando su nivel sanguneo de glucosa (hipoglucemia asintomtica). Si usted tiene diabetes tipo 2:  Si recibe insulina u otro medicamento para la diabetes, controle el nivel sanguneo de glucosa al Borders Groupmenos dos veces al da.  Mientras reciba tratamiento intensivo con insulina, debe medirse el nivel sanguneo de glucosa al menos 4veces al C.H. Robinson Worldwideda. Ocasionalmente, es posible que deba controlarse entre las 2:00a.m. y las 3:00a.m., segn se lo indiquen.  Tambin controle su nivel sanguneo de glucosa: ? Antes y despus de hacer ejercicio. ? Antes de Education officer, environmentalrealizar tareas peligrosas, Sport and exercise psychologistcomo manejar o usar maquinaria pesada.  Es posible que Chemical engineerdeba controlar con ms frecuencia los niveles sanguneos de glucosa si: ? Es necesario ajustar la dosis de sus medicamentos. ? Su diabetes no est bien controlada. ? Est enfermo. QU ES UN REGISTRO DIARIO DEL NIVEL [place] KnollSANGUNEO DE GLUCOSA?  Un registro diario es un registro de los valores de glucosa en la McCall[place]angre. Puede ayudarles a usted y a  su mdico a: ? ArchitectBuscar patrones en su nivel sanguneo de glucosa durante el transcurso del Akrontiempo. ? Ajustar su plan de control de la diabetes como sea necesario.  Cada vez que controle su nivel sanguneo de glucosa, anote el resultado y aquellos factores que pueden estar afectando su nivel sanguneo de glucosa, como la dieta y la actividad fsica Corporate investment bankerrealizada  en el da.  La Harley-Davidsonmayora de los medidores de glucosa guardan un registro de las lecturas realizadas con el medidor. Algunos permiten descargar sus registros en una computadora.  CMO ME CONTROLO EL NIVEL SANGUNEO DE GLUCOSA? Siga los siguientes pasos para obtener lecturas precisas de su glucemia: Materiales necesarios  Medidor de Horticulturist, commercialglucosa en la sangre.  Tiras reactivas para el medidor. Cada medidor tiene sus propias tiras reactivas. Debe usar las tiras reactivas que trae su medidor.  Una aguja para pincharse el dedo (lanceta). No utilice la misma lanceta en ms de una ocasin.  Un dispositivo que sujeta la lanceta (dispositivo de puncin).  Un diario o libro de anotaciones para YRC Worldwideanotar los resultados. Procedimiento  BorgWarnerLvese las manos con agua y Bel[place]jabn.  Pnchese el costado del dedo (no la punta) con Optometristla lanceta. Use un dedo diferente cada vez.  Frote suavemente el dedo General Millshasta que aparezca una pequea gota de [place]angre.  Siga las instrucciones que vienen con el medidor para Public affairs consultantinsertar la tira Firefighterreactiva, Contractoraplicar la sangre sobre la tira y usar el medidor de Horticulturist, commercialglucosa en la sangre.  Registre el resultado y las observaciones que desee. Zonas del cuerpo alternativas para realizar las pruebas  Algunos medidores le permiten tomar sangre para la prueba de otras zonas del cuerpo que no son el dedo (zonas alternativas).  Si cree que tiene hipoglucemia o si tiene hipoglucemia asintomtica, no utilice las zonas alternativas del cuerpo. En su lugar, use los dedos.  Es posible que las zonas alternativas no sean tan precisas como los dedos porque el flujo de sangre es ms lento en esas zonas. Esto significa que el resultado que obtiene de estas zonas puede estar retrasado y ser un poco diferente del resultado que obtendra del dedo.  Los sitios alternativos ms comunes son los siguientes: ? Los antebrazos. ? Los muslos. ? La palma de Qwest Communicationsla mano. Consejos adicionales  Siempre tenga los insumos a  mano.  Todos los medidores de glucosa incluyen un nmero de telfono "directo", disponible las 24 horas, al que podr llamar si tiene preguntas o French Southern Territoriesnecesita ayuda. Tambin puede consultar a su mdico.  Despus de usar algunas cajas de tiras reactivas, ajuste (calibre) el medidor de glucemia segn las instrucciones del medidor. Esta informacin no tiene Theme park managercomo fin reemplazar el consejo del mdico. Asegrese de hacerle al mdico cualquier pregunta que tenga. Document Released: 01/26/2005 Document Revised: 05/20/2015 Document Reviewed: 07/08/2015 Elsevier Interactive Patient Education  2017 [employer]

## 2016-09-11 NOTE — Progress Notes (Signed)
CC: DM, HLD refills  [employer] Spanish used.  HPI Diabetes Mellitus Type II, Follow-up: Uses insulin 70/30. Most recent dose was 15U units once in the morning. Doesn't check her CBGs at home. Has meter, but doesn't have strips. Cost is prohibitive for checking her CBGs bc she doesn't have a job and has trouble affording strips. Doesn't take metformin at all, sometimes takes once per day.   HLD - out of her statin x 2 days. Tolerating this well prior to running out.   [facility] card - not sure where, but patient thinks meds would be cheaper somewhere else. She doesn't work, and she had to pay >$60 for statin.    TSH - out of synthroid x 1 week. No symptoms.   CC, SH/smoking status, and VS noted  Objective: BP 128/80   Pulse 88   Temp 97.6 F (36.4 C) (Oral)   Wt 148 lb (67.1 kg)   SpO2 97%   BMI 24.63 kg/m  Gen: NAD, alert, cooperative, and pleasant female. HEENT: NCAT, EOMI, PERRL CV: RRR, no murmur Resp: CTAB, no wheezes, non-labored Ext: No edema, warm Neuro: Alert and oriented, Speech clear, No gross deficits  Assessment and plan:  Poorly controlled type 2 diabetes mellitus Patient was not taking evening insulin dose, nor checking CBGs. Recommended that she both be consistent with her metformin and take both morning and evening doses. Instructed patient to bring both meds and CBG log to visit with Dr. Raymondo BandKoval that we scheduled on 8/13. Rewrote her 70/30 to [facility] for decreased cost. Denies lows.  Hypothyroidism Patient out of synthroid. Will not check TSH today as out of med x >1 week. Will place future order for labs at visit with Dr. Raymondo BandKoval 8/13. Refilled synthroid to [facility] for cheaper cost. Denies any symptoms.  Hyperlipidemia Out of statin. Refilled to [facility] for cheaper cost. Will recheck Lipids with future labs for 8/13.    Orders Placed This Encounter  Procedures  . HgB A1c    Meds ordered this encounter  Medications  . DISCONTD: insulin NPH-regular Human  (NOVOLIN 70/30) (70-30) 100 UNIT/ML injection    Sig: 15 units in the morning and 8 units before her evening meal.    Dispense:  10 mL    [facility]  [facility]  . DISCONTD: glucose blood test strip    Sig: Use as instructed for PrestoWaveSense meter.  Dispense QS for testing up to thrice daily.  Dx E11.9    Dispense:  100 each    [facility]:  [facility]    [facility]  Marland Kitchen. DISCONTD: levothyroxine (SYNTHROID, LEVOTHROID) 125 MCG tablet    Sig: Take 1 tablet (125 mcg total) by mouth daily before breakfast.    Dispense:  30 tablet    Refill:  1    [facility]  . DISCONTD: simvastatin (ZOCOR) 40 MG tablet    Sig: Take 1 tablet (40 mg total) by mouth at bedtime.    Dispense:  30 tablet    Refill:  1    [facility]  . insulin NPH-regular Human (NOVOLIN 70/30) (70-30) 100 UNIT/ML injection    Sig: 15 units in the morning and 8 units before her evening meal.    Dispense:  10 mL    Refill:  11    [facility]  . glucose blood test strip    Sig: Use as instructed for PrestoWaveSense meter.  Dispense QS for testing  up to thrice daily.  Dx E11.9    Dispense:  100 each    Refill:  0    [facility]  Marland Kitchen. levothyroxine (SYNTHROID, LEVOTHROID) 125 MCG tablet    Sig: Take 1 tablet (125 mcg total) by mouth daily before breakfast.    Dispense:  30 tablet    Refill:  1    [facility]  . simvastatin (ZOCOR) 40 MG tablet    Sig: Take 1 tablet (40 mg total) by mouth at bedtime.    Dispense:  30 tablet    Refill:  1    [facility]  . Lancets MISC    Sig: Dispense quantity sufficient for thrice daily testing.    Dispense:  100 each    Refill:  0    E11.9  . metFORMIN (GLUCOPHAGE) 850 MG tablet    Sig: Take 1 tablet (850 mg total) by mouth 3 (three) times daily.    Dispense:  90 tablet    Refill:  1    Loni MuseKate Timberlake, MD, PGY2 09/11/2016 11:28 AM

## 2016-09-11 NOTE — Assessment & Plan Note (Signed)
Patient out of synthroid. Will not check TSH today as out of med x >1 week. Will place future order for labs at visit with Dr. Raymondo BandKoval 8/13. Refilled synthroid to [facility] for cheaper cost. Denies any symptoms.

## 2016-09-11 NOTE — Assessment & Plan Note (Addendum)
Patient was not taking evening insulin dose, nor checking CBGs. Recommended that she both be consistent with her metformin and take both morning and evening doses. Instructed patient to bring both meds and CBG log to visit with Dr. Raymondo BandKoval that we scheduled on 8/13. Rewrote her 70/30 to [facility] for decreased cost. Denies lows.

## 2016-09-11 NOTE — Telephone Encounter (Signed)
Pt seen in office today. Lamonte SakaiZimmerman Rumple, Austyn Seier D, [place]MA

## 2016-09-11 NOTE — Assessment & Plan Note (Signed)
Out of statin. Refilled to [facility] for cheaper cost. Will recheck Lipids with future labs for 8/13.

## 2016-09-21 ENCOUNTER — Ambulatory Visit (INDEPENDENT_AMBULATORY_CARE_PROVIDER_SITE_OTHER): Payer: Self-pay | Admitting: Pharmacist

## 2016-09-21 DIAGNOSIS — E1165 Type 2 diabetes mellitus with hyperglycemia: Secondary | ICD-10-CM

## 2016-09-21 NOTE — Progress Notes (Signed)
    S:     Chief Complaint  Patient presents with  . Medication Management    Diabetes    Patient arrives in fair spirits ambulating without assistance.  Presents for diabetes evaluation, education, and management at the request of Dr. Chanetta Marshallimberlake. Patient was referred on 09/11/16.  Patient was last seen by Primary Care Provider on 09/11/16. Patient is Spanish speaking, visit was conducted with the aid of the remote video interpreter service [phone]) Gillis SantaGabriela.   Patient reports adherence with medications.  Current diabetes medications include: Novolin 70/30, 15 units in the morning and 10 units with her evening meal; Metformin 850 mg TID  Patient reports hypoglycemic events in the past. She reports blurry vision/"losing her sight" when she felt hypoglycemic. Said that she felt shaky/sweaty earlier this week and ate something, after which she felt better. Patient has not checked sugar when feeling hypoglycemic. Reports that sugars had dropped to 40 "a long time ago" and she verbalized her hypoglycemia management plan. Patient said that she is not careful with her diet, and that her BG was very high when she first started checking (~450), but most recent readings are much improved.   O:  Physical Exam  Constitutional: She appears well-developed and well-nourished.  Vitals reviewed.    Review of Systems  All other systems reviewed and are negative.    Lab Results  Component Value Date   HGBA1C 11.8 09/11/2016   Vitals:   09/21/16 1131 09/21/16 1215  BP: (!) 151/75 (!) 151/78  Pulse: 77     Home fasting CBG: low to high 100s - no readings < 80 fasting.  No post-prandial readings reported.    A/P: Diabetes longstanding uncontrolled due to affordability issues and past nonadherence to medications. Patient reports hypoglycemic events in the past (none since recently staring insulin) and is able to verbalize appropriate hypoglycemia management plan. Patient reports adherence with  medication. Control is suboptimal due to nonadherence.  -Based on most recent fasting blood glucose readings, no adjustment to insulin 70/30 or metformin needed Continue same dose of Metformin 850mg  TID with meals and insulin 70/30 15 units in the AM and 10 in the PM.   Next A1C anticipated November/December 2018.    Written patient instructions provided.  Total time in face to face counseling 30 minutes.   Follow up with Dr. Chanetta Marshallimberlake in 3-4 weeks.   Patient seen with Lyanne Cooopali Sharma, PharmD Candidate, and Al CorpusLindsey Foltanski, PharmD PGY-1 Resident.

## 2016-09-21 NOTE — Patient Instructions (Addendum)
  Gracias por venir a vernos hoy!   1. Contine tomando todos sus medicamentos orales (las pastillas) mientras los toma actualmente.   2. Contine tomando su insulina mientras la toma actualmente.   3. Contina revisando tus azcares con tu medidor   Sigan con el buen Commercetrabajo!  Haga un seguimiento con su mdico de atencin primaria (Dr. Chanetta Marshallimberlake) en 3-4 semanas      Thank you for coming to see us today!  1. Continue taking all of your oral medications as you are taking them currently   2. Continue taking your insulin as you are taking it currently.   3. Continue checking your sugars with your meter  Keep up the good work! Follow up with your primary care physician (Dr. Chanetta Marshallimberlake) in 3-4 weeks

## 2016-09-21 NOTE — Assessment & Plan Note (Signed)
Diabetes longstanding uncontrolled due to affordability issues and past nonadherence to medications. Patient reports hypoglycemic events in the past (none since recently staring insulin) and is able to verbalize appropriate hypoglycemia management plan. Patient reports adherence with medication. Control is suboptimal due to nonadherence.  -Based on most recent fasting blood glucose readings, no adjustment to insulin 70/30 or metformin needed Continue same dose of Metformin 850mg  TID with meals and insulin 70/30 15 units in the AM and 10 in the PM.

## 2016-09-21 NOTE — Progress Notes (Signed)
Patient ID: Brittany CarmineCarmen Graciela Pineda, female   DOB: 11/06/1955, 61 y.o.   MRN: [id number] Reviewed: Agree with Dr. Macky LowerKoval's documentation and management.

## 2016-10-19 ENCOUNTER — Ambulatory Visit: Payer: Self-pay | Admitting: Family Medicine

## 2016-10-21 ENCOUNTER — Ambulatory Visit (INDEPENDENT_AMBULATORY_CARE_PROVIDER_SITE_OTHER): Payer: Self-pay | Admitting: Internal Medicine

## 2016-10-21 ENCOUNTER — Encounter: Payer: Self-pay | Admitting: Internal Medicine

## 2016-10-21 VITALS — BP 146/80 | HR 80 | Temp 98.6°F | Ht 65.0 in | Wt 150.0 lb

## 2016-10-21 DIAGNOSIS — E785 Hyperlipidemia, unspecified: Secondary | ICD-10-CM

## 2016-10-21 DIAGNOSIS — E039 Hypothyroidism, unspecified: Secondary | ICD-10-CM

## 2016-10-21 DIAGNOSIS — E1165 Type 2 diabetes mellitus with hyperglycemia: Secondary | ICD-10-CM

## 2016-10-21 DIAGNOSIS — I10 Essential (primary) hypertension: Secondary | ICD-10-CM

## 2016-10-21 LAB — POCT GLYCOSYLATED HEMOGLOBIN (HGB A1C): HEMOGLOBIN A1C: 9.4

## 2016-10-21 MED ORDER — LISINOPRIL 10 MG PO TABS: 10.0000 mg | ORAL_TABLET | Freq: Every day | ORAL | 0 refills | Status: DC

## 2016-10-21 NOTE — Progress Notes (Signed)
   Subjective:    Brittany Pineda - 61 y.o. female MRN [id number]  Date of birth: 06/18/1955  HPI  Brittany Pineda is here for follow up of chronic medical conditions.  Diabetes mellitus, Type 2 Disease Monitoring Blood Sugar Ranges: Does not have log or meter today. Reports fasting CBGs mid to  upper 100s.  Polyuria: no Visual problems: no   Last A1C: 11.8 (Aug 2018)   Medication Compliance: yes--reports now taking Metformin as prescribed  Medication Side Effects Hypoglycemia: no   HYPERLIPIDEMIA  Symptoms Chest pain on exertion:  no    Leg claudication:   no  Medication Monitoring Compliance- Yes. Re-started Simvastatin at last office visit.   Right upper quadrant pain- no   Muscle aches- no  HTN: Patient with no diagnosis of HTN but BPs have been elevated at past two office visits as well as in 2016. Patient denies headaches, chest pain, and vision changes.   Hypothyroidism: Patient reports compliance with Synthroid since refilled at last office visit. No hot/cold intolerance, skin/hair/nail changes, or unintended changes in weight.    -  reports that she has quit smoking. She has never used smokeless tobacco. - Review of Systems: Per HPI. - Past Medical History: Patient Active Problem List   Diagnosis Date Noted  . Essential hypertension 10/22/2016  . Onychomycosis 04/07/2016  . Depression 04/03/2014  . Hypothyroidism 03/13/2014  . Hyperlipidemia 07/22/2011  . Poorly controlled type 2 diabetes mellitus (HCC) 07/22/2011   - Medications: reviewed and updated   Objective:   Physical Exam BP (!) 146/80   Pulse 80   Temp 98.6 F (37 C) (Oral)   Ht 5\' 5"  (1.651 m)   Wt 150 lb (68 kg)   BMI 24.96 kg/m  Gen: NAD, alert, cooperative with exam, well-appearing CV: RRR, good S1/S2, no murmur, no edema, capillary refill brisk  Resp: CTABL, no wheezes, non-labored    Assessment & Plan:    Poorly controlled type 2 diabetes mellitus Marked improvement in A1c in 1 month from 11.8 to 9.4. Will continue current insulin regimen and metformin regimen as patient now endorsing compliance and has shown improvement in overall glucose control. Patient to return in 1 month for f/u with PCP or Koval. Instructed to bring blood sugar log. Reviewed hypoglycemia protocol today.   Hypothyroidism Last TSH elevated to 26 and patient has a history of poor compliance with Synthroid. Will check TSH today as patient reports taking medication consistently over past month.   Hyperlipidemia Will recheck lipid panel today per PCP's plan. Continue Simvastatin. Would calculate ASCVD risk score with new data to determine if patient would benefit from ASA and/or high intensity statin therapy.   Essential hypertension New diagnosis. Have started Lisinopril 10 mg for HTN and for renal protection with h/o uncontrolled T2DM. Return for BMET in 1 week. Follow up BP in 1 month.     Marcy Sirenatherine Holbert Caples, D.O. 10/22/2016, 10:29 AM PGY-3, [facility]

## 2016-10-21 NOTE — Patient Instructions (Signed)
Return in 1 week for blood work with your new medication.   Return in 1 month for an appointment with Dr. Timberlake orChanetta Marshall Dr. Raymondo BandKoval.

## 2016-10-21 NOTE — Assessment & Plan Note (Addendum)
Marked improvement in A1c in 1 month from 11.8 to 9.4. Will continue current insulin regimen and metformin regimen as patient now endorsing compliance and has shown improvement in overall glucose control. Patient to return in 1 month for f/u with PCP or Koval. Instructed to bring blood sugar log. Reviewed hypoglycemia protocol today.

## 2016-10-22 DIAGNOSIS — I10 Essential (primary) hypertension: Secondary | ICD-10-CM | POA: Insufficient documentation

## 2016-10-22 LAB — BASIC METABOLIC PANEL
BUN/Creatinine Ratio: 27 — ABNORMAL HIGH (ref 9–23)
BUN: 15 mg/dL (ref 6–24)
CALCIUM: 9.4 mg/dL (ref 8.7–10.2)
CO2: 17 mmol/L — AB (ref 20–29)
CREATININE: 0.56 mg/dL — AB (ref 0.57–1.00)
Chloride: 105 mmol/L (ref 96–106)
GFR calc Af Amer: 119 mL/min/{1.73_m2} (ref >59)
GFR, EST NON AFRICAN AMERICAN: 103 mL/min/{1.73_m2} (ref >59)
Glucose: 239 mg/dL — ABNORMAL HIGH (ref 65–99)
POTASSIUM: 4.9 mmol/L (ref 3.5–5.2)
Sodium: 140 mmol/L (ref 134–144)

## 2016-10-22 LAB — CBC

## 2016-10-22 LAB — T3, FREE: T3, Free: 3.3 pg/mL (ref 2.0–4.4)

## 2016-10-22 NOTE — Assessment & Plan Note (Signed)
Will recheck lipid panel today per PCP's plan. Continue Simvastatin. Would calculate ASCVD risk score with new data to determine if patient would benefit from ASA and/or high intensity statin therapy.

## 2016-10-22 NOTE — Assessment & Plan Note (Signed)
New diagnosis. Have started Lisinopril 10 mg for HTN and for renal protection with h/o uncontrolled T2DM. Return for BMET in 1 week. Follow up BP in 1 month.

## 2016-10-22 NOTE — Assessment & Plan Note (Signed)
Last TSH elevated to 26 and patient has a history of poor compliance with Synthroid. Will check TSH today as patient reports taking medication consistently over past month.

## 2016-10-23 LAB — LIPID PANEL
CHOL/HDL RATIO: 3.7 ratio (ref 0.0–4.4)
Cholesterol, Total: 140 mg/dL (ref 100–199)
HDL: 38 mg/dL — ABNORMAL LOW (ref >39)
LDL CALC: 64 mg/dL (ref 0–99)
Triglycerides: 188 mg/dL — ABNORMAL HIGH (ref 0–149)
VLDL CHOLESTEROL CAL: 38 mg/dL (ref 5–40)

## 2016-10-23 LAB — TSH: TSH: 0.152 u[IU]/mL — AB (ref 0.450–4.500)

## 2016-11-02 ENCOUNTER — Encounter: Payer: Self-pay | Admitting: Internal Medicine

## 2016-11-13 ENCOUNTER — Other Ambulatory Visit: Payer: Self-pay | Admitting: *Deleted

## 2016-11-13 DIAGNOSIS — E039 Hypothyroidism, unspecified: Secondary | ICD-10-CM

## 2016-11-16 MED ORDER — LEVOTHYROXINE SODIUM 125 MCG PO TABS: 125.0000 ug | ORAL_TABLET | Freq: Every day | ORAL | 0 refills | Status: DC

## 2016-11-23 ENCOUNTER — Other Ambulatory Visit: Payer: Self-pay | Admitting: Internal Medicine

## 2016-11-23 ENCOUNTER — Other Ambulatory Visit: Payer: Self-pay | Admitting: Family Medicine

## 2016-11-23 DIAGNOSIS — E782 Mixed hyperlipidemia: Secondary | ICD-10-CM

## 2016-11-23 DIAGNOSIS — I10 Essential (primary) hypertension: Secondary | ICD-10-CM

## 2016-11-23 NOTE — Telephone Encounter (Signed)
Please call patient and have her recheck Cr with BMP lab this week. Will put BMP in separate orders only encounter. Will give 1 month of refill.

## 2016-11-24 NOTE — Telephone Encounter (Signed)
Called pt using pac[employer] interpre[employer]. Bonita Quin, [phone]). Phone rang, pt does not have VM set up. Sunday Spillers, CMA

## 2016-11-25 MED ORDER — SIMVASTATIN 40 MG PO TABS: 40.0000 mg | ORAL_TABLET | Freq: Every day | ORAL | 6 refills | Status: DC

## 2016-11-25 NOTE — Progress Notes (Signed)
Received fax request for simvastatin refill. Provided refill. LDL 64 on last lipid check.

## 2016-11-28 ENCOUNTER — Other Ambulatory Visit: Payer: Self-pay | Admitting: Internal Medicine

## 2016-11-28 DIAGNOSIS — I10 Essential (primary) hypertension: Secondary | ICD-10-CM

## 2016-12-04 ENCOUNTER — Telehealth: Payer: Self-pay | Admitting: *Deleted

## 2016-12-04 ENCOUNTER — Other Ambulatory Visit: Payer: Self-pay | Admitting: Family Medicine

## 2016-12-04 DIAGNOSIS — E1165 Type 2 diabetes mellitus with hyperglycemia: Secondary | ICD-10-CM

## 2016-12-04 MED ORDER — INSULIN NPH ISOPHANE & REGULAR (70-30) 100 UNIT/ML ~~LOC~~ SUSP: SUBCUTANEOUS | 11 refills | Status: DC

## 2016-12-04 NOTE — Telephone Encounter (Signed)
[facility] line [facility] [phone]) asking for rx for simvastatin at granddaughter's request. This Rx was [facility] to [facility] on 11/25/2016. Spoke to [place] and VO given. Dawn was also asking about levothyroxine and that Rx was sent to [facility] on [address] on 11/16/2016. Asked Dawn to have granddaughter call us to clean up pharmacy list as there are currently 5 listed. Kinnie FeilL. Mariza Bourget, RN, BSN

## 2016-12-04 NOTE — Telephone Encounter (Signed)
Granddaughter is requesting refill on her insulin. [facility] [address]

## 2017-01-01 ENCOUNTER — Other Ambulatory Visit: Payer: Self-pay | Admitting: Family Medicine

## 2017-01-01 DIAGNOSIS — I10 Essential (primary) hypertension: Secondary | ICD-10-CM

## 2017-01-04 ENCOUNTER — Other Ambulatory Visit: Payer: Self-pay | Admitting: Family Medicine

## 2017-01-04 DIAGNOSIS — E1165 Type 2 diabetes mellitus with hyperglycemia: Secondary | ICD-10-CM

## 2017-01-04 DIAGNOSIS — I10 Essential (primary) hypertension: Secondary | ICD-10-CM

## 2017-01-04 NOTE — Telephone Encounter (Signed)
Please call patient, she is overdue for repeat labs and an office visit to check BP. I will provide on additional month.

## 2017-01-04 NOTE — Telephone Encounter (Signed)
Just filled earlier today. Patient needs appt.

## 2017-01-05 NOTE — Telephone Encounter (Signed)
[employer] interpreter [place] [phone] used.  Patient didn't answer phone and voicemail wasn't set up.  Will try again later. Jazmin Hartsell,CMA

## 2017-01-07 ENCOUNTER — Encounter: Payer: Self-pay | Admitting: *Deleted

## 2017-01-07 NOTE — Telephone Encounter (Signed)
Letter mailed to patient. Tilley Faeth,CMA  

## 2017-02-04 ENCOUNTER — Other Ambulatory Visit: Payer: Self-pay | Admitting: Family Medicine

## 2017-02-04 DIAGNOSIS — I10 Essential (primary) hypertension: Secondary | ICD-10-CM

## 2017-02-05 NOTE — Telephone Encounter (Signed)
This patient needs an appointment to assess how BP medication is working for her as well as check BMET since starting lisinopril. Will refill 1 month supply. Thank you!

## 2017-02-05 NOTE — Telephone Encounter (Signed)
Patient has an appt on 02/23/17 with Dr. Chanetta Marshallimberlake. [name],CMA

## 2017-02-06 ENCOUNTER — Other Ambulatory Visit: Payer: Self-pay | Admitting: Family Medicine

## 2017-02-06 DIAGNOSIS — I10 Essential (primary) hypertension: Secondary | ICD-10-CM

## 2017-02-11 ENCOUNTER — Telehealth: Payer: Self-pay | Admitting: *Deleted

## 2017-02-11 DIAGNOSIS — E1165 Type 2 diabetes mellitus with hyperglycemia: Secondary | ICD-10-CM

## 2017-02-11 DIAGNOSIS — E039 Hypothyroidism, unspecified: Secondary | ICD-10-CM

## 2017-02-11 MED ORDER — METFORMIN HCL 850 MG PO TABS: 850.0000 mg | ORAL_TABLET | Freq: Three times a day (TID) | ORAL | 0 refills | Status: DC

## 2017-02-11 MED ORDER — LEVOTHYROXINE SODIUM 125 MCG PO TABS: 125.0000 ug | ORAL_TABLET | Freq: Every day | ORAL | 0 refills | Status: DC

## 2017-02-11 NOTE — Telephone Encounter (Signed)
Pt walks in and needs refills on Metformin and levothyroxine to last till appt.  Gave one month supply.  MUST KEEP APPT for more, Aleene DavidsonDale Magtoto will relay message. [place], Maryjo RochesterJessica Dawn, CMA

## 2017-02-23 ENCOUNTER — Other Ambulatory Visit: Payer: Self-pay

## 2017-02-23 ENCOUNTER — Encounter: Payer: Self-pay | Admitting: Family Medicine

## 2017-02-23 ENCOUNTER — Ambulatory Visit (INDEPENDENT_AMBULATORY_CARE_PROVIDER_SITE_OTHER): Payer: Self-pay | Admitting: Family Medicine

## 2017-02-23 VITALS — BP 138/72 | HR 81 | Temp 98.6°F | Wt 142.0 lb

## 2017-02-23 DIAGNOSIS — I10 Essential (primary) hypertension: Secondary | ICD-10-CM

## 2017-02-23 DIAGNOSIS — Z1211 Encounter for screening for malignant neoplasm of colon: Secondary | ICD-10-CM

## 2017-02-23 DIAGNOSIS — E782 Mixed hyperlipidemia: Secondary | ICD-10-CM

## 2017-02-23 DIAGNOSIS — Z1231 Encounter for screening mammogram for malignant neoplasm of breast: Secondary | ICD-10-CM

## 2017-02-23 DIAGNOSIS — Z1239 Encounter for other screening for malignant neoplasm of breast: Secondary | ICD-10-CM

## 2017-02-23 DIAGNOSIS — E1165 Type 2 diabetes mellitus with hyperglycemia: Secondary | ICD-10-CM

## 2017-02-23 DIAGNOSIS — E039 Hypothyroidism, unspecified: Secondary | ICD-10-CM

## 2017-02-23 DIAGNOSIS — E1142 Type 2 diabetes mellitus with diabetic polyneuropathy: Secondary | ICD-10-CM

## 2017-02-23 LAB — POCT GLYCOSYLATED HEMOGLOBIN (HGB A1C): HEMOGLOBIN A1C: 8.2

## 2017-02-23 MED ORDER — METFORMIN HCL 850 MG PO TABS: 850.0000 mg | ORAL_TABLET | Freq: Three times a day (TID) | ORAL | 0 refills | Status: DC

## 2017-02-23 MED ORDER — INSULIN NPH ISOPHANE & REGULAR (70-30) 100 UNIT/ML ~~LOC~~ SUSP: SUBCUTANEOUS | 11 refills | Status: DC

## 2017-02-23 MED ORDER — LEVOTHYROXINE SODIUM 125 MCG PO TABS: 125.0000 ug | ORAL_TABLET | Freq: Every day | ORAL | 1 refills | Status: DC

## 2017-02-23 MED ORDER — GABAPENTIN 100 MG PO CAPS: 100.0000 mg | ORAL_CAPSULE | Freq: Every day | ORAL | 3 refills | Status: DC

## 2017-02-23 MED ORDER — LISINOPRIL 10 MG PO TABS: 10.0000 mg | ORAL_TABLET | Freq: Every day | ORAL | 1 refills | Status: DC

## 2017-02-23 MED ORDER — SIMVASTATIN 40 MG PO TABS: 40.0000 mg | ORAL_TABLET | Freq: Every day | ORAL | 6 refills | Status: DC

## 2017-02-23 NOTE — Assessment & Plan Note (Signed)
Patient reports compliance with levothyroxine. Recheck TSH today. Will call patient if need to adjust dose.

## 2017-02-23 NOTE — Assessment & Plan Note (Signed)
Slow onset of bilateral symmetrical tingling in feet and hands. Suspect diabetic neuropathy given years of poor control. Will treat as such and consider further workup if no improvement.

## 2017-02-23 NOTE — Assessment & Plan Note (Signed)
Increase novolin to 18U in am and if tolerating well increase to 12U in pm. Return in 3 months to recheck a1c. Asked patient to please get battery for meter and check BGs.

## 2017-02-23 NOTE — Assessment & Plan Note (Signed)
Patient concerned with seeing light spots occasionally. Asked her to check BP at these times. BP appropriate in clinic today, continue current regimen.

## 2017-02-23 NOTE — Patient Instructions (Addendum)
It was a pleasure to see you today! Thank you for choosing [facility] for your primary care. Brittany Pineda was seen for diabetes.   Our plans for today were:  Increase you insulin to 18U in the morning. If your blood sugars continue to be >200 at home and you don't notice any lows, increase your insulin to 12U.   Please see an eye doctor to have your eyes checked.   To keep you healthy, we need to monitor some screening tests. You are due for mammogram, colonscopy. Please schedule these.   You should return to our clinic to see Dr. Chanetta Marshall in 3 months for diabetes check.   Best,  Dr. Chanetta Marshall   Diet Recommendations for Diabetes   Starchy (carb) foods: Bread, rice, pasta, potatoes, corn, cereal, grits, crackers, bagels, muffins, all baked goods.  (Fruits, milk, and yogurt also have carbohydrate, but most of these foods will not spike your blood sugar as most starchy foods will.)  A few fruits do cause high blood sugars; use small portions of bananas (limit to 1/2 at a time), grapes, watermelon, oranges, and most tropical fruits.    Protein foods: Meat, fish, poultry, eggs, dairy foods, and beans such as pinto and kidney beans (beans also provide carbohydrate).   1. Eat at least 3 meals and 1-2 snacks per day. Never go more than 4-5 hours while awake without eating. Eat breakfast within the first hour of getting up.   2. Limit starchy foods to TWO per meal and ONE per snack. ONE portion of a starchy  food is equal to the following:   - ONE slice of bread (or its equivalent, such as half of a hamburger bun).   - 1/2 cup of a "scoopable" starchy food such as potatoes or rice.   - 15 grams of Total Carbohydrate as shown on food label.  3. Include at every meal: a protein food, a carb food, and vegetables and/or fruit.   - Obtain twice the volume of veg's as protein or carbohydrate foods for both lunch and dinner.   - Fresh or frozen veg's are best.   - Keep  frozen veg's on hand for a quick vegetable serving.      Fue un placer verte hoy! Karl Pock por elegir [facility] para su atencin primaria. Brittany Pineda fue atendida por diabetes.  Nuestros planes para hoy fueron: Incrementa tu insulina a 18U por la maana. Si sus niveles de azcar en la sangre continan siendo> 200 en el hogar y no observa mnimos, aumente su insulina a 12U.  Consulte a un oculista para que [employer].  Para mantenerlo saludable, necesitamos monitorear algunas pruebas de deteccin. Se debe realizar Thereasa Solo, una colonoscopia. Por favor programe estos  Debe regresar a nuestra clnica para ver al Dr. Chanetta Marshall en 3 meses para un examen de diabetes.  Mejor, Dr. Chanetta Marshall   Recomendaciones de dieta para la diabetes  Alimentos con almidn (carbohidratos): pan, arroz, pasta, papas, maz, cereales, smola, galletas saladas, panecillos, panecillos, todos los productos horneados. (Las frutas, la [place] y el yogur tambin contienen carbohidratos, Biomedical engineer la mayora de estos alimentos no aumentarn el nivel de International aid/development worker en la sangre como lo hacen la mayora de los alimentos ricos en almidn). Algunas frutas causan niveles altos de azcar en la sangre; use porciones pequeas de bananas (limite a 1/2 a la vez), uvas, sanda, naranjas y la [address] de las frutas tropicales.  Alimentos con protenas: carne, pescado,  aves, huevos, productos lcteos y frijoles como los frijoles pintos y riones (los frijoles tambin proporcionan carbohidratos).  1. Coma al menos 3 comidas y 1-2 bocadillos por da. Nunca pase ms de 4-5 horas mientras est despierto sin comer. Desayuna dentro de la primera hora de levantarte. 2. Limite los alimentos con almidn a DOS por comida y UNO por refrigerio. UNA porcin de un alimento con almidn es igual a lo siguiente: - UNA rebanada de pan (o su equivalente, como la mitad de un pan de hamburguesa). - 1/2 taza de un alimento  "almidonado", como papas o arroz. - 15 gramos de carbohidratos totales como se [place]muestra en la etiqueta de los alimentos. 3. Incluya en cada comida: un alimento con protenas, un alimento con carbohidratos y verduras y / o frutas. - Obtenga el doble del volumen de verduras como protenas o carbohidratos para el almuerzo y Physicist, medicalla cena. - Las verduras frescas o congeladas son las mejores. - Mantenga las verduras congeladas a mano para una rpida racin de

## 2017-02-23 NOTE — Progress Notes (Signed)
CC: DM follow up   HPI Diabetes Mellitus Type II, Follow-up: Patient here for follow-up of Type 2 diabetes mellitus.  Current symptoms/problems include paresthesia of the hands, feet. Has had foot numbness x months, hand numbness is new over the last 2 weeks. Has not tried anything for this in the past.   Known diabetic complications: none Cardiovascular risk factors: diabetes mellitus and hypertension Current diabetic medications include insulin injections: novolin 70/30: 15U in am, 10U in evening; metformin 3 pills per day.   Weight trend: Wt Readings from Last 3 Encounters:  02/23/17 142 lb (64.4 kg)  10/21/16 150 lb (68 kg)  09/21/16 152 lb (68.9 kg)    Current diet: trying to eat less sugar, tries to eat less overall Current exercise: none, works as a Financial trader  Current monitoring regimen: no home CBG checks, has meter but ran out of batteries  Home blood sugar records: none checked per patient Any episodes of hypoglycemia? no  Is She on ACE inhibitor or angiotensin II receptor blocker?  Yes  lisinopril (Zestril)   Also occasionally sees lights and blurry vision. Did not check BP then. Does not check BP at home. One time she was sitting, one time while standing.   ROS: denies CP, SOB, abd pain, dysuria, rash.    CC, SH/smoking status, and VS noted  Objective: BP 138/72   Pulse 81   Temp 98.6 F (37 C) (Oral)   Wt 142 lb (64.4 kg)   SpO2 99%   BMI 23.63 kg/m  Gen: NAD, alert, cooperative, and pleasant. HEENT: NCAT, EOMI, PERRL CV: RRR, no murmur Resp: CTAB, no wheezes, non-labored Ext: No edema, warm. Sensation intact.  Neuro: Alert and oriented, Speech clear, No gross deficits  Assessment and plan:  Poorly controlled type 2 diabetes mellitus Increase novolin to 18U in am and if tolerating well increase to 12U in pm. Return in 3 months to recheck a1c. Asked patient to please get battery for meter and check BGs.  Hypothyroidism Patient reports  compliance with levothyroxine. Recheck TSH today. Will call patient if need to adjust dose.   Essential hypertension Patient concerned with seeing light spots occasionally. Asked her to check BP at these times. BP appropriate in clinic today, continue current regimen.   Diabetic polyneuropathy associated with type 2 diabetes mellitus (HCC) Slow onset of bilateral symmetrical tingling in feet and hands. Suspect diabetic neuropathy given years of poor control. Will treat as such and consider further workup if no improvement.    Orders Placed This Encounter  Procedures  . MM Digital Screening    Standing Status:   Future    Standing Expiration Date:   04/24/2018    Order Specific Question:   Reason for Exam (SYMPTOM  OR DIAGNOSIS REQUIRED)    Answer:   screening    Order Specific Question:   Is the patient pregnant?    Answer:   No    Order Specific Question:   Preferred imaging location?    Answer:   [facility]  . Basic metabolic panel  . TSH  . Ambulatory referral to Gastroenterology    Referral Priority:   Routine    Referral Type:   Consultation    Referral Reason:   Specialty Services Required    Number of Visits Requested:   1  . HgB A1c    Meds ordered this encounter  Medications  . gabapentin (NEURONTIN) 100 MG capsule    Sig: Take 1 capsule (100 mg  total) by mouth at bedtime. Increase to 300mg  at bedtime if continued tingling in feet.    Dispense:  90 capsule    Refill:  3  . lisinopril (PRINIVIL,ZESTRIL) 10 MG tablet    Sig: Take 1 tablet (10 mg total) by mouth daily.    Dispense:  90 tablet    Refill:  1    Please consider 90 day supplies to promote better adherence  . levothyroxine (SYNTHROID, LEVOTHROID) 125 MCG tablet    Sig: Take 1 tablet (125 mcg total) by mouth daily before breakfast.    Dispense:  90 tablet    Refill:  1    [facility]  . metFORMIN (GLUCOPHAGE) 850 MG tablet    Sig: Take 1 tablet (850 mg total) by mouth 3 (three) times  daily.    Dispense:  90 tablet    Refill:  0    Please consider 90 day supplies to promote better adherence  . simvastatin (ZOCOR) 40 MG tablet    Sig: Take 1 tablet (40 mg total) by mouth at bedtime.    Dispense:  30 tablet    Refill:  6    [facility]  . insulin NPH-regular Human (NOVOLIN 70/30) (70-30) 100 UNIT/ML injection    Sig: 18 units in the morning and 10 units before her evening meal.    Dispense:  10 mL    [facility]  [facility]    [facility]    Health Maintenance reviewed - mammogram ordered, patient to schedule appointment.  Loni MuseKate Langston [place], MD, PGY2 02/23/2017 3:06 PM

## 2017-02-24 LAB — BASIC METABOLIC PANEL
BUN / CREAT RATIO: 22 (ref 9–23)
BUN: 12 mg/dL (ref 6–24)
CALCIUM: 9.9 mg/dL (ref 8.7–10.2)
CO2: 24 mmol/L (ref 20–29)
Chloride: 101 mmol/L (ref 96–106)
Creatinine, Ser: 0.55 mg/dL — ABNORMAL LOW (ref 0.57–1.00)
GFR, EST AFRICAN AMERICAN: 119 mL/min/{1.73_m2} (ref >59)
GFR, EST NON AFRICAN AMERICAN: 103 mL/min/{1.73_m2} (ref >59)
Glucose: 84 mg/dL (ref 65–99)
POTASSIUM: 4.8 mmol/L (ref 3.5–5.2)
SODIUM: 142 mmol/L (ref 134–144)

## 2017-02-24 LAB — TSH: TSH: 0.026 u[IU]/mL — ABNORMAL LOW (ref 0.450–4.500)

## 2017-02-26 ENCOUNTER — Telehealth: Payer: Self-pay | Admitting: *Deleted

## 2017-02-26 DIAGNOSIS — E039 Hypothyroidism, unspecified: Secondary | ICD-10-CM

## 2017-02-26 NOTE — Telephone Encounter (Signed)
[facility] requesting clarification on instructions on how to increase gabapentin from 100 mg to 300 mg. Spoke with Rosey Batheresa at [facility] who states rx usually written as 100 mg for x amt of days then 200 mg for x amt of days then 300 mg. Please call [facility] at [phone] to clarify. Kinnie FeilL. Siddiq Kaluzny, RN, BSN

## 2017-03-05 NOTE — Telephone Encounter (Signed)
[facility] calls again to check status. Fleeger, Maryjo RochesterJessica Dawn, CMA

## 2017-03-09 MED ORDER — GABAPENTIN 100 MG PO CAPS: 100.0000 mg | ORAL_CAPSULE | Freq: Every day | ORAL | 3 refills | Status: DC

## 2017-03-09 MED ORDER — LEVOTHYROXINE SODIUM 112 MCG PO TABS: 112.0000 ug | ORAL_TABLET | Freq: Every day | ORAL | 0 refills | Status: DC

## 2017-03-09 NOTE — Telephone Encounter (Signed)
Resent edited rx and decreased levothyroxine.

## 2017-06-29 ENCOUNTER — Other Ambulatory Visit: Payer: Self-pay | Admitting: Family Medicine

## 2017-06-29 DIAGNOSIS — E1165 Type 2 diabetes mellitus with hyperglycemia: Secondary | ICD-10-CM

## 2017-06-30 NOTE — Telephone Encounter (Signed)
Will send metformin refill, but patient is overdue for DM recheck. Please help her schedule an appt w me in the next month.

## 2017-07-01 NOTE — Telephone Encounter (Signed)
Tried to contact pt via [employer] [phone] and when she tried pt the VM had not been set up.  If pt calls back please inform her of below and assist her in scheduling an appointment. Lamonte Sakai, Trygg Mantz D, [place]

## 2017-08-11 ENCOUNTER — Other Ambulatory Visit: Payer: Self-pay | Admitting: Family Medicine

## 2017-08-11 DIAGNOSIS — E1165 Type 2 diabetes mellitus with hyperglycemia: Secondary | ICD-10-CM

## 2017-08-13 NOTE — Telephone Encounter (Signed)
White team, patient is now overdue x 2 refills for DM recheck. Will provide one more refill but please try another call to have her come in.

## 2017-08-16 NOTE — Telephone Encounter (Signed)
Called pt using [employer](Jose, [phone]). No answer and voicemail not set up. Sunday SpillersSharon T Saunders, CMA

## 2017-08-18 NOTE — Telephone Encounter (Signed)
Tried to contact pt via [employer] [phone]) to give her the below information and assist her in scheduling an appointment, there was no option to LVM due tonot being set up yet. Please inform her and schedule her an appointment if she calls back. Lamonte SakaiZimmerman Rumple, April D, [place]

## 2017-08-20 ENCOUNTER — Other Ambulatory Visit: Payer: Self-pay

## 2017-08-21 ENCOUNTER — Other Ambulatory Visit: Payer: Self-pay | Admitting: Family Medicine

## 2017-08-21 DIAGNOSIS — I10 Essential (primary) hypertension: Secondary | ICD-10-CM

## 2017-08-23 NOTE — Telephone Encounter (Signed)
Did not refill lisinopril, needs appt, have said this in several previous encounters and we cannot get in touch with her. I left two notes to the pharmacy on the rejected med in hopes that they will let her know to call.

## 2017-09-08 ENCOUNTER — Other Ambulatory Visit: Payer: Self-pay | Admitting: Family Medicine

## 2017-09-08 DIAGNOSIS — I10 Essential (primary) hypertension: Secondary | ICD-10-CM

## 2017-09-08 NOTE — Telephone Encounter (Signed)
Will refill lisinopril, but patient overdue for appt with me. Please call and have patient seen as soon as they can for BP follow up.

## 2017-09-09 NOTE — Telephone Encounter (Signed)
Contacted pt via [employer] [phone]#[phone] and VM has not been set up. If pt calls back please assist in scheduling her an appointment. Lamonte SakaiZimmerman Rumple, Krish Bailly D, [place]

## 2017-09-27 ENCOUNTER — Other Ambulatory Visit: Payer: Self-pay | Admitting: Family Medicine

## 2017-09-27 DIAGNOSIS — E1165 Type 2 diabetes mellitus with hyperglycemia: Secondary | ICD-10-CM

## 2017-09-28 NOTE — Telephone Encounter (Signed)
Contacted pt using [employer] interpreter christina#[phone], informed pt that her Rx had been sent to pharmacy but that she will need to schedule an appointment before anymore refills and she stated that she is trying to get her orange card and financial assistance taken care of and when she does she will schedule an appointment. Lamonte SakaiZimmerman Rumple, Curly Mackowski D, [place]

## 2017-09-28 NOTE — Telephone Encounter (Signed)
Will refill metformin, but patient overdue for appt with me. Please call and have patient seen as soon as they can for DM and BP follow up. Made note to pharmacy on rx.

## 2018-05-31 ENCOUNTER — Encounter (HOSPITAL_COMMUNITY): Payer: Self-pay

## 2018-05-31 ENCOUNTER — Other Ambulatory Visit: Payer: Self-pay

## 2018-05-31 ENCOUNTER — Emergency Department (HOSPITAL_COMMUNITY)
Admission: EM | Admit: 2018-05-31 | Discharge: 2018-05-31 | Disposition: A | Discharge status: Home / Self Care | Payer: Self-pay | Attending: Emergency Medicine | Admitting: Emergency Medicine

## 2018-05-31 DIAGNOSIS — E1142 Type 2 diabetes mellitus with diabetic polyneuropathy: Secondary | ICD-10-CM | POA: Insufficient documentation

## 2018-05-31 DIAGNOSIS — Z794 Long term (current) use of insulin: Secondary | ICD-10-CM | POA: Insufficient documentation

## 2018-05-31 DIAGNOSIS — Z79899 Other long term (current) drug therapy: Secondary | ICD-10-CM | POA: Insufficient documentation

## 2018-05-31 DIAGNOSIS — E119 Type 2 diabetes mellitus without complications: Secondary | ICD-10-CM | POA: Insufficient documentation

## 2018-05-31 DIAGNOSIS — I10 Essential (primary) hypertension: Secondary | ICD-10-CM | POA: Insufficient documentation

## 2018-05-31 DIAGNOSIS — Z87891 Personal history of nicotine dependence: Secondary | ICD-10-CM | POA: Insufficient documentation

## 2018-05-31 LAB — BASIC METABOLIC PANEL
Anion gap: 11 (ref 5–15)
BUN: 24 mg/dL — ABNORMAL HIGH (ref 6–20)
CO2: 26 mmol/L (ref 22–32)
Calcium: 9.9 mg/dL (ref 8.9–10.3)
Chloride: 100 mmol/L (ref 98–111)
Creatinine, Ser: 0.92 mg/dL (ref 0.44–1.00)
GFR calc Af Amer: >60 mL/min (ref >60)
GFR calc non Af Amer: >60 mL/min (ref >60)
Glucose, Bld: 147 mg/dL — ABNORMAL HIGH (ref 70–99)
Potassium: 5.7 mmol/L — ABNORMAL HIGH (ref 3.5–5.1)
Sodium: 137 mmol/L (ref 135–145)

## 2018-05-31 LAB — CBC WITH DIFFERENTIAL/PLATELET
Abs Immature Granulocytes: 0.03 10*3/uL (ref 0.00–0.07)
Basophils Absolute: 0.1 10*3/uL (ref 0.0–0.1)
Basophils Relative: 1 %
Eosinophils Absolute: 0.3 10*3/uL (ref 0.0–0.5)
Eosinophils Relative: 3 %
HCT: 37.4 % (ref 36.0–46.0)
Hemoglobin: 12.7 g/dL (ref 12.0–15.0)
Immature Granulocytes: 0 %
Lymphocytes Relative: 20 %
Lymphs Abs: 2 10*3/uL (ref 0.7–4.0)
MCH: 31.7 pg (ref 26.0–34.0)
MCHC: 34 g/dL (ref 30.0–36.0)
MCV: 93.3 fL (ref 80.0–100.0)
Monocytes Absolute: 0.8 10*3/uL (ref 0.1–1.0)
Monocytes Relative: 8 %
Neutro Abs: 6.8 10*3/uL (ref 1.7–7.7)
Neutrophils Relative %: 68 %
Platelets: 327 10*3/uL (ref 150–400)
RBC: 4.01 MIL/uL (ref 3.87–5.11)
RDW: 11.8 % (ref 11.5–15.5)
WBC: 10 10*3/uL (ref 4.0–10.5)
nRBC: 0 % (ref 0.0–0.2)

## 2018-05-31 NOTE — ED Provider Notes (Signed)
[facility] EMERGENCY DEPARTMENT Provider Note   CSN: [id number] Arrival date & time: 05/31/18  2011    History   Chief Complaint Chief Complaint  Patient presents with  . Hypertension    HPI Brittany Pineda is a 63 y.o. female.     HPI Patient presents to the emergency department with elevated blood pressure.  The patient states that she was seen in emergency department there for elevated blood pressure.  The patient states she is had no other symptoms.  The patient denies chest pain, shortness of breath, headache,blurred vision, neck pain, fever, cough, weakness, numbness, dizziness, anorexia, edema, abdominal pain, nausea, vomiting, diarrhea, rash, back pain, dysuria, hematemesis, bloody stool, near syncope, or syncope. Past Medical History:  Diagnosis Date  . Diabetes mellitus   . Hyperlipidemia   . Thyroid disease     Patient Active Problem List   Diagnosis Date Noted  . Diabetic polyneuropathy associated with type 2 diabetes mellitus ([place]) 02/23/2017  . Essential hypertension 10/22/2016  . Onychomycosis 04/07/2016  . Depression 04/03/2014  . Hypothyroidism 03/13/2014  . Hyperlipidemia 07/22/2011  . Poorly controlled type 2 diabetes mellitus ([place]) 07/22/2011    Past Surgical History:  Procedure Laterality Date  . ABDOMINAL HYSTERECTOMY    . CHOLECYSTECTOMY       OB History   No obstetric history on file.      Home Medications    Prior to Admission medications   Medication Sig Start Date End Date Taking? Authorizing Provider  insulin NPH-regular Human (NOVOLIN 70/30) (70-30) 100 UNIT/ML injection 18 units in the morning and 10 units before her evening meal. Patient taking differently: Inject 10-18 Units into the skin See admin instructions. Inject 18 units in the morning and 10 units before her evening meal. 02/23/17  Yes Sela Hilding, MD  levothyroxine (SYNTHROID, LEVOTHROID) 112 MCG tablet Take 1 tablet (112 mcg  total) by mouth daily before breakfast. 03/09/17  Yes Sela Hilding, MD  lisinopril (PRINIVIL,ZESTRIL) 10 MG tablet TAKE 1 TABLET BY MOUTH ONCE DAILY Patient taking differently: Take 10 mg by mouth daily.  09/08/17  Yes Sela Hilding, MD  metFORMIN (GLUCOPHAGE) 850 MG tablet Take 1 tablet (850 mg total) by mouth 3 (three) times daily. 02/23/17  Yes Sela Hilding, MD  simvastatin (ZOCOR) 40 MG tablet Take 1 tablet (40 mg total) by mouth at bedtime. 02/23/17  Yes Sela Hilding, MD  Blood Glucose Monitoring Suppl (AGAMATRIX PRESTO) w/Device KIT Use as instructed for PrestoWaveSense meter.  Dispense QS for testing up to thrice daily.  Dx E11.9 04/07/16   Kinnie Feil, MD  gabapentin (NEURONTIN) 100 MG capsule Take 1 capsule (100 mg total) by mouth at bedtime. Patient not taking: Reported on 05/31/2018 03/09/17   Sela Hilding, MD  glucose blood test strip Use as instructed for PrestoWaveSense meter.  Dispense QS for testing up to thrice daily.  Dx E11.9 09/11/16   Sela Hilding, MD  Lancets MISC Dispense quantity sufficient for thrice daily testing. 09/11/16   Sela Hilding, MD  metFORMIN (GLUCOPHAGE) 850 MG tablet TAKE 1 TABLET BY MOUTH THREE TIMES DAILY Patient not taking: Reported on 05/31/2018 09/28/17   Sela Hilding, MD  RELION SHORT PEN NEEDLES 31G X 8 MM MISC USE AS DIRECTED ONCE DAILY 07/09/15   Rosemarie Ax, MD    Family History Family History  Problem Relation Age of Onset  . Alcohol abuse Father   . Hyperlipidemia Sister   . Depression Brother     Social  History Social History   Tobacco Use  . Smoking status: Former Research scientist (life sciences)  . Smokeless tobacco: Never Used  . Tobacco comment: Stopped in 1996  Substance Use Topics  . Alcohol use: No    Alcohol/week: 0.0 standard drinks  . Drug use: No     Allergies   Patient has no known allergies.   Review of Systems Review of Systems All other systems negative except as documented in the  HPI. All pertinent positives and negatives as reviewed in the HPI.  Physical Exam Updated Vital Signs BP 118/81   Pulse 96   Temp 98.1 F (36.7 C) (Oral)   Resp 18   SpO2 98%   Physical Exam Vitals signs and nursing note reviewed.  Constitutional:      General: She is not in acute distress.    Appearance: She is well-developed.  HENT:     Head: Normocephalic and atraumatic.  Eyes:     Pupils: Pupils are equal, round, and reactive to light.  Neck:     Musculoskeletal: Normal range of motion and neck supple.  Cardiovascular:     Rate and Rhythm: Normal rate and regular rhythm.     Heart sounds: Normal heart sounds. No murmur. No friction rub. No gallop.   Pulmonary:     Effort: Pulmonary effort is normal. No respiratory distress.     Breath sounds: Normal breath sounds. No wheezing.  Skin:    General: Skin is warm and dry.     Capillary Refill: Capillary refill takes less than 2 seconds.     Findings: No erythema or rash.  Neurological:     Mental Status: She is alert and oriented to person, place, and time.     Motor: No abnormal muscle tone.     Coordination: Coordination normal.  Psychiatric:        Behavior: Behavior normal.      ED Treatments / Results  Labs (all labs ordered are listed, but only abnormal results are displayed) Labs Reviewed  BASIC METABOLIC PANEL - Abnormal; Notable for the following components:      Result Value   Potassium 5.7 (*)    Glucose, Bld 147 (*)    BUN 24 (*)    All other components within normal limits  CBC WITH DIFFERENTIAL/PLATELET    EKG None  Radiology No results found.  Procedures Procedures (including critical care time)  Medications Ordered in ED Medications - No data to display   Initial Impression / Assessment and Plan / ED Course  I have reviewed the triage vital signs and the nursing notes.  Pertinent labs & imaging results that were available during my care of the patient were reviewed by me and  considered in my medical decision making (see chart for details).       Patient will be asked to follow-up with her primary doctor.  Told to return here as needed.  Patient agrees the plan and all questions were answered. Final Clinical Impressions(s) / ED Diagnoses   Final diagnoses:  None    ED Discharge Orders    None       Dalia Heading, PA-C 05/31/18 2256    Maudie Flakes, MD 06/01/18 1501

## 2018-05-31 NOTE — ED Triage Notes (Signed)
Pt is spanish speaking only. Pt here for hypertension. Recently went to [place] 3 days ago and seen in a ED there for the same and told to follow up with PCP. Pt states that she did not take her medication for two day due to her BP being 115 systolic. She took her meds today and took double dose.

## 2018-05-31 NOTE — Discharge Instructions (Addendum)
Follow-up with your primary doctor.  Return here as needed.  °

## 2018-06-27 ENCOUNTER — Encounter (HOSPITAL_COMMUNITY): Payer: Self-pay | Admitting: Oncology

## 2018-06-27 ENCOUNTER — Other Ambulatory Visit: Payer: Self-pay

## 2018-06-27 ENCOUNTER — Emergency Department (HOSPITAL_COMMUNITY): Payer: Self-pay

## 2018-06-27 ENCOUNTER — Emergency Department (HOSPITAL_COMMUNITY)
Admission: EM | Admit: 2018-06-27 | Discharge: 2018-06-27 | Disposition: A | Discharge status: Home / Self Care | Payer: Self-pay | Attending: Emergency Medicine | Admitting: Emergency Medicine

## 2018-06-27 DIAGNOSIS — Z87891 Personal history of nicotine dependence: Secondary | ICD-10-CM | POA: Insufficient documentation

## 2018-06-27 DIAGNOSIS — I10 Essential (primary) hypertension: Secondary | ICD-10-CM | POA: Insufficient documentation

## 2018-06-27 DIAGNOSIS — R0602 Shortness of breath: Secondary | ICD-10-CM | POA: Insufficient documentation

## 2018-06-27 DIAGNOSIS — E119 Type 2 diabetes mellitus without complications: Secondary | ICD-10-CM | POA: Insufficient documentation

## 2018-06-27 DIAGNOSIS — Z794 Long term (current) use of insulin: Secondary | ICD-10-CM | POA: Insufficient documentation

## 2018-06-27 DIAGNOSIS — R002 Palpitations: Secondary | ICD-10-CM | POA: Insufficient documentation

## 2018-06-27 DIAGNOSIS — E039 Hypothyroidism, unspecified: Secondary | ICD-10-CM | POA: Insufficient documentation

## 2018-06-27 DIAGNOSIS — R42 Dizziness and giddiness: Secondary | ICD-10-CM | POA: Insufficient documentation

## 2018-06-27 LAB — CBC WITH DIFFERENTIAL/PLATELET
Abs Immature Granulocytes: 0.03 10*3/uL (ref 0.00–0.07)
Basophils Absolute: 0.1 10*3/uL (ref 0.0–0.1)
Basophils Relative: 1 %
Eosinophils Absolute: 0.4 10*3/uL (ref 0.0–0.5)
Eosinophils Relative: 4 %
HCT: 37.6 % (ref 36.0–46.0)
Hemoglobin: 12.9 g/dL (ref 12.0–15.0)
Immature Granulocytes: 0 %
Lymphocytes Relative: 31 %
Lymphs Abs: 3.2 10*3/uL (ref 0.7–4.0)
MCH: 32 pg (ref 26.0–34.0)
MCHC: 34.3 g/dL (ref 30.0–36.0)
MCV: 93.3 fL (ref 80.0–100.0)
Monocytes Absolute: 0.7 10*3/uL (ref 0.1–1.0)
Monocytes Relative: 7 %
Neutro Abs: 5.9 10*3/uL (ref 1.7–7.7)
Neutrophils Relative %: 57 %
Platelets: 361 10*3/uL (ref 150–400)
RBC: 4.03 MIL/uL (ref 3.87–5.11)
RDW: 11.9 % (ref 11.5–15.5)
WBC: 10.3 10*3/uL (ref 4.0–10.5)
nRBC: 0 % (ref 0.0–0.2)

## 2018-06-27 LAB — BASIC METABOLIC PANEL
Anion gap: 12 (ref 5–15)
BUN: 9 mg/dL (ref 6–20)
CO2: 23 mmol/L (ref 22–32)
Calcium: 9.8 mg/dL (ref 8.9–10.3)
Chloride: 101 mmol/L (ref 98–111)
Creatinine, Ser: 0.79 mg/dL (ref 0.44–1.00)
GFR calc Af Amer: >60 mL/min (ref >60)
GFR calc non Af Amer: >60 mL/min (ref >60)
Glucose, Bld: 85 mg/dL (ref 70–99)
Potassium: 4.6 mmol/L (ref 3.5–5.1)
Sodium: 136 mmol/L (ref 135–145)

## 2018-06-27 LAB — TROPONIN I: Troponin I: <0.03 ng/mL (ref <0.03)

## 2018-06-27 NOTE — Discharge Instructions (Signed)
Continue checking your blood pressure twice daily and recording in journal as you have been Follow-up with your primary care doctor. Return here for any new/acute changes.

## 2018-06-27 NOTE — ED Triage Notes (Signed)
Pt reports high BP despite taking medications.  States she felt dizzy when she woke up and has progressed during the day.

## 2018-06-27 NOTE — ED Provider Notes (Signed)
[facility] EMERGENCY DEPARTMENT Provider Note   CSN: [id number] Arrival date & time: 06/27/18  0241    History   Chief Complaint Chief Complaint  Patient presents with  . Hypertension    HPI Brittany Pineda is a 63 y.o. female.     The history is provided by the patient and medical records. The history is limited by a language barrier. A language interpreter was used.  Hypertension  Associated symptoms include shortness of breath.    63 year old female with history of thyroid disease, hyperlipidemia, hypertension, diabetes, presenting to the ED with elevated blood pressure.  States she was started on 10 mg lisinopril by her primary care doctor a few months ago but had some low BPs into the 63J systolic and dose was decreased to 5 mg.  States she has been in [place] with family for the past several months but came back due to concerns of increasing blood pressure while there.  States she woke up yesterday morning and felt a little lightheaded and dizzy like her blood pressure was elevated.  She took her pressure with home cough and it was 497 systolic.  States she took her medication as normal and initially felt like pressure was going down but she checked it several more times and it was a little lower but then would go right back up.  States she is trying to go to sleep tonight but was having some palpitations and shortness of breath so decided to come in.  She has not had any chest pain, pain with breathing, cough, fever.  She did travel back from [place] about 3 weeks ago in order to follow-up with PCP.  She did not have any sick exposures or known COVID exposures.  Past Medical History:  Diagnosis Date  . Diabetes mellitus   . Hyperlipidemia   . Thyroid disease     Patient Active Problem List   Diagnosis Date Noted  . Diabetic polyneuropathy associated with type 2 diabetes mellitus ([place]) 02/23/2017  . Essential hypertension 10/22/2016  .  Onychomycosis 04/07/2016  . Depression 04/03/2014  . Hypothyroidism 03/13/2014  . Hyperlipidemia 07/22/2011  . Poorly controlled type 2 diabetes mellitus ([place]) 07/22/2011    Past Surgical History:  Procedure Laterality Date  . ABDOMINAL HYSTERECTOMY    . CHOLECYSTECTOMY       OB History   No obstetric history on file.      Home Medications    Prior to Admission medications   Medication Sig Start Date End Date Taking? Authorizing Provider  Blood Glucose Monitoring Suppl (AGAMATRIX PRESTO) w/Device KIT Use as instructed for PrestoWaveSense meter.  Dispense QS for testing up to thrice daily.  Dx E11.9 04/07/16   Kinnie Feil, MD  gabapentin (NEURONTIN) 100 MG capsule Take 1 capsule (100 mg total) by mouth at bedtime. Patient not taking: Reported on 05/31/2018 03/09/17   Sela Hilding, MD  glucose blood test strip Use as instructed for PrestoWaveSense meter.  Dispense QS for testing up to thrice daily.  Dx E11.9 09/11/16   Sela Hilding, MD  insulin NPH-regular Human (NOVOLIN 70/30) (70-30) 100 UNIT/ML injection 18 units in the morning and 10 units before her evening meal. Patient taking differently: Inject 10-18 Units into the skin See admin instructions. Inject 18 units in the morning and 10 units before her evening meal. 02/23/17   Sela Hilding, MD  Lancets MISC Dispense quantity sufficient for thrice daily testing. 09/11/16   Sela Hilding, MD  levothyroxine ([place], [place])  112 MCG tablet Take 1 tablet (112 mcg total) by mouth daily before breakfast. 03/09/17   Sela Hilding, MD  lisinopril (PRINIVIL,ZESTRIL) 10 MG tablet TAKE 1 TABLET BY MOUTH ONCE DAILY Patient taking differently: Take 10 mg by mouth daily.  09/08/17   Sela Hilding, MD  metFORMIN (GLUCOPHAGE) 850 MG tablet Take 1 tablet (850 mg total) by mouth 3 (three) times daily. 02/23/17   Sela Hilding, MD  metFORMIN (GLUCOPHAGE) 850 MG tablet TAKE 1 TABLET BY MOUTH THREE TIMES  DAILY Patient not taking: Reported on 05/31/2018 09/28/17   Sela Hilding, MD  RELION SHORT PEN NEEDLES 31G X 8 MM MISC USE AS DIRECTED ONCE DAILY 07/09/15   Rosemarie Ax, MD  simvastatin (ZOCOR) 40 MG tablet Take 1 tablet (40 mg total) by mouth at bedtime. 02/23/17   Sela Hilding, MD    Family History Family History  Problem Relation Age of Onset  . Alcohol abuse Father   . Hyperlipidemia Sister   . Depression Brother     Social History Social History   Tobacco Use  . Smoking status: Former Research scientist (life sciences)  . Smokeless tobacco: Never Used  . Tobacco comment: Stopped in 1996  Substance Use Topics  . Alcohol use: No    Alcohol/week: 0.0 standard drinks  . Drug use: No     Allergies   Patient has no known allergies.   Review of Systems Review of Systems  Respiratory: Positive for shortness of breath.   Cardiovascular: Positive for palpitations.  All other systems reviewed and are negative.    Physical Exam Updated Vital Signs BP (!) 160/94 (BP Location: Right Arm)   Pulse 87   Temp 98.4 F (36.9 C) (Oral)   Resp 14   Ht 5' 5"  (1.651 m)   Wt 68 kg   SpO2 100%   BMI 24.96 kg/m   Physical Exam Vitals signs and nursing note reviewed.  Constitutional:      Appearance: She is well-developed.  HENT:     Head: Normocephalic and atraumatic.  Eyes:     Conjunctiva/sclera: Conjunctivae normal.     Pupils: Pupils are equal, round, and reactive to light.  Neck:     Musculoskeletal: Normal range of motion.  Cardiovascular:     Rate and Rhythm: Normal rate and regular rhythm.     Heart sounds: Normal heart sounds.     Comments: NSR, rate 80's on exam Pulmonary:     Effort: Pulmonary effort is normal.     Breath sounds: Normal breath sounds. No wheezing or rhonchi.     Comments: Lungs clear, no distress, talking with interpreter in paragraphs without issue Abdominal:     General: Bowel sounds are normal.     Palpations: Abdomen is soft.  Musculoskeletal:  Normal range of motion.  Skin:    General: Skin is warm and dry.  Neurological:     Mental Status: She is alert and oriented to person, place, and time.      ED Treatments / Results  Labs (all labs ordered are listed, but only abnormal results are displayed) Labs Reviewed  CBC WITH DIFFERENTIAL/PLATELET  BASIC METABOLIC PANEL  TROPONIN I    EKG EKG Interpretation  Date/Time:  Monday Jun 27 2018 02:54:03 EDT Ventricular Rate:  84 PR Interval:    QRS Duration: 79 QT Interval:  379 QTC Calculation: 448 R Axis:   -30 Text Interpretation:  Sinus rhythm Left axis deviation NO STEMI. No old tracing to compare Confirmed by [place], Shellee Milo (  [id number]) on 06/27/2018 2:56:28 AM   Radiology Dg Chest Port 1 View  Result Date: 06/27/2018 CLINICAL DATA:  63 year old female with hypertension, shortness of breath. EXAM: PORTABLE CHEST 1 VIEW COMPARISON:  None. FINDINGS: Portable AP upright view at 0302 hours. Normal cardiac size and mediastinal contours. Lung volumes are within normal limits. Allowing for portable technique the lungs are clear. No pneumothorax or pleural effusion. Visualized tracheal air column is within normal limits. Negative visible bowel gas pattern. No acute osseous abnormality identified. IMPRESSION: Negative portable chest. Electronically Signed   By: Genevie Ann M.D.   On: 06/27/2018 03:59    Procedures Procedures (including critical care time)  Medications Ordered in ED Medications - No data to display   Initial Impression / Assessment and Plan / ED Course  I have reviewed the triage vital signs and the nursing notes.  Pertinent labs & imaging results that were available during my care of the patient were reviewed by me and considered in my medical decision making (see chart for details).  63 year old female here with elevated blood pressure.  After talking with her it sounds like this is been going on for quite some time.  She is recently been in [place] with family but  came back to follow-up with her primary care doctor due to elevated readings.  Had some lightheadedness/dizziness yesterday an episode of palpitations and shortness of breath but this is resolved at this time.  Patient is awake, alert, appropriately oriented.  Her neurologic exam is nonfocal.  She was able to walk back to treatment room from triage.  Heart rate is stable and normal sinus rhythm.  BP 160/94.  Based on walk in room this is slightly above her baseline.  She is not had any chest pain.  EKG without any acute ischemic changes.  Plan for screening labs, portable chest x-ray.  Will monitor.  Labs reassuring.  CXR clear.  Patient has been resting comfortably.  Patient BP has trended down here without any acute intervention.  Now 100/74 which is around her baseline compared with values that she has in her BP log from the past few days.  Feel she is stable for discharge home.  Recommended that she continue monitoring her blood pressure at home and recording twice daily.  She will need close follow-up with her primary care doctor, preferably this week if possible.  She can return here for any new/acute changes.  Results and care plan discussed with patient via language interpreter, she acknowledged understanding and agreed with plan of care.  Final Clinical Impressions(s) / ED Diagnoses   Final diagnoses:  Essential hypertension    ED Discharge Orders    None       Larene Pickett, PA-C 06/27/18 [place], MD 06/28/18 [phone]

## 2018-07-22 ENCOUNTER — Encounter (HOSPITAL_COMMUNITY): Payer: Self-pay | Admitting: *Deleted

## 2018-07-22 ENCOUNTER — Emergency Department (HOSPITAL_COMMUNITY)
Admission: EM | Admit: 2018-07-22 | Discharge: 2018-07-22 | Disposition: A | Discharge status: Home / Self Care | Payer: Self-pay | Attending: Emergency Medicine | Admitting: Emergency Medicine

## 2018-07-22 ENCOUNTER — Emergency Department (HOSPITAL_COMMUNITY): Payer: Self-pay

## 2018-07-22 ENCOUNTER — Other Ambulatory Visit: Payer: Self-pay

## 2018-07-22 DIAGNOSIS — Z79899 Other long term (current) drug therapy: Secondary | ICD-10-CM | POA: Insufficient documentation

## 2018-07-22 DIAGNOSIS — I10 Essential (primary) hypertension: Secondary | ICD-10-CM | POA: Insufficient documentation

## 2018-07-22 DIAGNOSIS — E039 Hypothyroidism, unspecified: Secondary | ICD-10-CM | POA: Insufficient documentation

## 2018-07-22 DIAGNOSIS — F329 Major depressive disorder, single episode, unspecified: Secondary | ICD-10-CM | POA: Insufficient documentation

## 2018-07-22 DIAGNOSIS — Z794 Long term (current) use of insulin: Secondary | ICD-10-CM | POA: Insufficient documentation

## 2018-07-22 DIAGNOSIS — R42 Dizziness and giddiness: Secondary | ICD-10-CM | POA: Insufficient documentation

## 2018-07-22 DIAGNOSIS — Z87891 Personal history of nicotine dependence: Secondary | ICD-10-CM | POA: Insufficient documentation

## 2018-07-22 DIAGNOSIS — E119 Type 2 diabetes mellitus without complications: Secondary | ICD-10-CM | POA: Insufficient documentation

## 2018-07-22 LAB — URINALYSIS, ROUTINE W REFLEX MICROSCOPIC
Bilirubin Urine: NEGATIVE
Glucose, UA: NEGATIVE mg/dL
Hgb urine dipstick: NEGATIVE
Ketones, ur: NEGATIVE mg/dL
Leukocytes,Ua: NEGATIVE
Nitrite: NEGATIVE
Protein, ur: NEGATIVE mg/dL
Specific Gravity, Urine: 1.003 — ABNORMAL LOW (ref 1.005–1.030)
pH: 6 (ref 5.0–8.0)

## 2018-07-22 LAB — CBC
HCT: 35.8 % — ABNORMAL LOW (ref 36.0–46.0)
Hemoglobin: 12.1 g/dL (ref 12.0–15.0)
MCH: 31.9 pg (ref 26.0–34.0)
MCHC: 33.8 g/dL (ref 30.0–36.0)
MCV: 94.5 fL (ref 80.0–100.0)
Platelets: 382 10*3/uL (ref 150–400)
RBC: 3.79 MIL/uL — ABNORMAL LOW (ref 3.87–5.11)
RDW: 11.9 % (ref 11.5–15.5)
WBC: 10.4 10*3/uL (ref 4.0–10.5)
nRBC: 0 % (ref 0.0–0.2)

## 2018-07-22 LAB — BASIC METABOLIC PANEL
Anion gap: 13 (ref 5–15)
BUN: 18 mg/dL (ref 6–20)
CO2: 22 mmol/L (ref 22–32)
Calcium: 10 mg/dL (ref 8.9–10.3)
Chloride: 99 mmol/L (ref 98–111)
Creatinine, Ser: 0.91 mg/dL (ref 0.44–1.00)
GFR calc Af Amer: >60 mL/min (ref >60)
GFR calc non Af Amer: >60 mL/min (ref >60)
Glucose, Bld: 114 mg/dL — ABNORMAL HIGH (ref 70–99)
Potassium: 4.5 mmol/L (ref 3.5–5.1)
Sodium: 134 mmol/L — ABNORMAL LOW (ref 135–145)

## 2018-07-22 LAB — TROPONIN I: Troponin I: <0.03 ng/mL (ref <0.03)

## 2018-07-22 LAB — TSH: TSH: 8.139 u[IU]/mL — ABNORMAL HIGH (ref 0.350–4.500)

## 2018-07-22 MED ORDER — SODIUM CHLORIDE 0.9% FLUSH
3.0000 mL | Freq: Once | INTRAVENOUS | Status: AC
  Administered 2018-07-22: 3 mL via INTRAVENOUS

## 2018-07-22 MED ORDER — KETOROLAC TROMETHAMINE 15 MG/ML IJ SOLN
15.0000 mg | Freq: Once | INTRAMUSCULAR | Status: AC
  Administered 2018-07-22: 04:00:00 15 mg via INTRAVENOUS
  Filled 2018-07-22: qty 1

## 2018-07-22 MED ORDER — SODIUM CHLORIDE 0.9 % IV BOLUS
500.0000 mL | Freq: Once | INTRAVENOUS | Status: AC
  Administered 2018-07-22: 500 mL via INTRAVENOUS

## 2018-07-22 NOTE — ED Provider Notes (Signed)
[facility] EMERGENCY DEPARTMENT Provider Note   CSN: [id number] Arrival date & time: 07/22/18  0141     History   Chief Complaint Chief Complaint  Patient presents with   Hypertension   Headache    HPI Brittany Pineda is a 63 y.o. female who presents with HTN , dizziness, HA. The patient reports she has been feeling bad for about 2 months. She attributes this to her blood pressure and blood sugars which seem to be labile. When her blood pressure is very high she feels dizzy and when it is low she feels like she is going to pass out. When she feels dizzy she doesn't know if it's from her BP or sugar so she checks both throughout the day. She has lost weight recently and her Lisinopril dose was reduced to once in the AM. Today she was feeling dizzy for most of the day. Tonight the feeling was worse so she took an extra 70m of Lisinopril and decided to come to the ED. She states that she generally just feels bad. She reports a mild frontal headache with spots in her vision when her pressure is high. Last weeks she felt like she couldn't get a deep breath and had chest pressure. This has resolved. She denies fever, cough, leg swelling. She has been on Lisinopril for several years.  HPI  Past Medical History:  Diagnosis Date   Diabetes mellitus    Hyperlipidemia    Thyroid disease     Patient Active Problem List   Diagnosis Date Noted   Diabetic polyneuropathy associated with type 2 diabetes mellitus ([place] 02/23/2017   Essential hypertension 10/22/2016   Onychomycosis 04/07/2016   Depression 04/03/2014   Hypothyroidism 03/13/2014   Hyperlipidemia 07/22/2011   Poorly controlled type 2 diabetes mellitus ([facility] 07/22/2011    Past Surgical History:  Procedure Laterality Date   ABDOMINAL HYSTERECTOMY     CHOLECYSTECTOMY       OB History   No obstetric history on file.      Home Medications    Prior to Admission medications     Medication Sig Start Date End Date Taking? Authorizing Provider  Blood Glucose Monitoring Suppl (AGAMATRIX PRESTO) w/Device KIT Use as instructed for PrestoWaveSense meter.  Dispense QS for testing up to thrice daily.  Dx E11.9 04/07/16   EKinnie Feil MD  gabapentin (NEURONTIN) 100 MG capsule Take 1 capsule (100 mg total) by mouth at bedtime. Patient not taking: Reported on 05/31/2018 03/09/17   TSela Hilding MD  glucose blood test strip Use as instructed for PrestoWaveSense meter.  Dispense QS for testing up to thrice daily.  Dx E11.9 09/11/16   TSela Hilding MD  insulin NPH-regular Human (NOVOLIN 70/30) (70-30) 100 UNIT/ML injection 18 units in the morning and 10 units before her evening meal. Patient taking differently: Inject 10-18 Units into the skin See admin instructions. Inject 18 units in the morning and 10 units before her evening meal. 02/23/17   TSela Hilding MD  Lancets MISC Dispense quantity sufficient for thrice daily testing. 09/11/16   TSela Hilding MD  levothyroxine (SYNTHROID, LEVOTHROID) 112 MCG tablet Take 1 tablet (112 mcg total) by mouth daily before breakfast. 03/09/17   TSela Hilding MD  lisinopril (PRINIVIL,ZESTRIL) 10 MG tablet TAKE 1 TABLET BY MOUTH ONCE DAILY Patient taking differently: Take 10 mg by mouth daily.  09/08/17   TSela Hilding MD  metFORMIN (GLUCOPHAGE) 850 MG tablet Take 1 tablet (850 mg total) by mouth 3 (  three) times daily. 02/23/17   Sela Hilding, MD  metFORMIN (GLUCOPHAGE) 850 MG tablet TAKE 1 TABLET BY MOUTH THREE TIMES DAILY Patient not taking: Reported on 05/31/2018 09/28/17   Sela Hilding, MD  RELION SHORT PEN NEEDLES 31G X 8 MM MISC USE AS DIRECTED ONCE DAILY 07/09/15   Rosemarie Ax, MD  simvastatin (ZOCOR) 40 MG tablet Take 1 tablet (40 mg total) by mouth at bedtime. 02/23/17   Sela Hilding, MD    Family History Family History  Problem Relation Age of Onset   Alcohol abuse Father     Hyperlipidemia Sister    Depression Brother     Social History Social History   Tobacco Use   Smoking status: Former Smoker   Smokeless tobacco: Never Used   Tobacco comment: Stopped in 1996  Substance Use Topics   Alcohol use: No    Alcohol/week: 0.0 standard drinks   Drug use: No     Allergies   Patient has no known allergies.   Review of Systems Review of Systems  Constitutional: Positive for chills and fatigue. Negative for fever.  Respiratory: Positive for shortness of breath (resolved). Negative for cough.   Cardiovascular: Positive for chest pain (resolved). Negative for leg swelling.  Gastrointestinal: Negative for abdominal pain, nausea and vomiting.  Neurological: Positive for dizziness, light-headedness and headaches. Negative for syncope.  All other systems reviewed and are negative.    Physical Exam Updated Vital Signs BP (!) 161/80 (BP Location: Right Arm)    Pulse 85    Temp 98.6 F (37 C) (Oral)    Resp 20    SpO2 98%   Physical Exam Vitals signs and nursing note reviewed.  Constitutional:      General: She is not in acute distress.    Appearance: Normal appearance. She is well-developed. She is not ill-appearing.     Comments: Calm, cooperative. Fatigued appearing  HENT:     Head: Normocephalic and atraumatic.  Eyes:     General: No scleral icterus.       Right eye: No discharge.        Left eye: No discharge.     Conjunctiva/sclera: Conjunctivae normal.     Pupils: Pupils are equal, round, and reactive to light.  Neck:     Musculoskeletal: Normal range of motion.  Cardiovascular:     Rate and Rhythm: Normal rate and regular rhythm.  Pulmonary:     Effort: Pulmonary effort is normal. No respiratory distress.     Breath sounds: Normal breath sounds.  Abdominal:     General: There is no distension.     Palpations: Abdomen is soft.     Tenderness: There is no abdominal tenderness.  Skin:    General: Skin is warm and dry.   Neurological:     Mental Status: She is alert and oriented to person, place, and time.     Comments: Lying on stretcher in NAD. GCS 15. Speaks in a clear voice. Cranial nerves II through XII grossly intact. 5/5 strength in all extremities. Sensation fully intact.  Bilateral finger-to-nose intact. Ambulatory   Psychiatric:        Mood and Affect: Mood is depressed.        Behavior: Behavior normal.      ED Treatments / Results  Labs (all labs ordered are listed, but only abnormal results are displayed) Labs Reviewed  BASIC METABOLIC PANEL - Abnormal; Notable for the following components:      Result Value  Sodium 134 (*)    Glucose, Bld 114 (*)    All other components within normal limits  CBC - Abnormal; Notable for the following components:   RBC 3.79 (*)    HCT 35.8 (*)    All other components within normal limits  URINALYSIS, ROUTINE W REFLEX MICROSCOPIC - Abnormal; Notable for the following components:   Color, Urine COLORLESS (*)    Specific Gravity, Urine 1.003 (*)    All other components within normal limits  TROPONIN I  TSH    EKG EKG Interpretation  Date/Time:  Friday July 22 2018 02:11:10 EDT Ventricular Rate:  79 PR Interval:    QRS Duration: 77 QT Interval:  345 QTC Calculation: 396 R Axis:   3 Text Interpretation:  Sinus rhythm No significant change since last tracing Confirmed by Pryor Curia [phone]) on 07/22/2018 6:35:59 AM   Radiology Dg Chest 2 View  Result Date: 07/22/2018 CLINICAL DATA:  63 year old female with chest pain and shortness of breath. EXAM: CHEST - 2 VIEW COMPARISON:  Portable chest 06/27/2018. FINDINGS: PA and lateral views. Lung volumes are at the upper limits of normal. Normal cardiac size and mediastinal contours. Visualized tracheal air column is within normal limits. Both lungs appear clear. No pneumothorax or pleural effusion. No acute osseous abnormality identified. Negative visible bowel gas pattern. IMPRESSION: No acute  cardiopulmonary abnormality. Electronically Signed   By: Genevie Ann M.D.   On: 07/22/2018 03:49   Ct Head Wo Contrast  Result Date: 07/22/2018 CLINICAL DATA:  Headaches.  Dizziness and nausea. EXAM: CT HEAD WITHOUT CONTRAST TECHNIQUE: Contiguous axial images were obtained from the base of the skull through the vertex without intravenous contrast. COMPARISON:  None. FINDINGS: Brain: No acute infarct, hemorrhage, or mass lesion is present. No significant white matter lesions are present. The ventricles are of normal size. No significant extraaxial fluid collection is present. The brainstem and cerebellum are within normal limits. Vascular: No hyperdense vessel or unexpected calcification. Skull: Calvarium is intact. No focal lytic or blastic lesions are present. No significant extracranial lesions are present. Sinuses/Orbits: A fluid level is present left maxillary sinus. There is chronic wall thickening of both maxillary sinuses. Mild mucosal thickening is present in the inferior right maxillary sinus. Scattered ethmoid mucosal thickening is present. There is fluid in the right sphenoid sinus. Frontal sinuses are clear. Mastoid air cells are clear. The globes and orbits are within normal limits. IMPRESSION: 1. Normal CT appearance of the brain. 2. Acute on chronic left maxillary sinus disease. 3. Additional sinus disease involving the ethmoid air cells bilaterally and right sphenoid sinus. No obstructing lesions are evident. Electronically Signed   By: San Morelle M.D.   On: 07/22/2018 04:47    Procedures Procedures (including critical care time)  Medications Ordered in ED Medications  sodium chloride flush (NS) 0.9 % injection 3 mL (3 mLs Intravenous Given by Other 07/22/18 0238)  sodium chloride 0.9 % bolus 500 mL (0 mLs Intravenous Stopped 07/22/18 0532)  ketorolac (TORADOL) 15 MG/ML injection 15 mg (15 mg Intravenous Given 07/22/18 0413)     Initial Impression / Assessment and Plan / ED  Course  I have reviewed the triage vital signs and the nursing notes.  Pertinent labs & imaging results that were available during my care of the patient were reviewed by me and considered in my medical decision making (see chart for details).  63 year old female presents with multiple symptoms.  She is primarily concerned with her labile blood pressures and  blood glucose.  Blood pressure was initially elevated here however has normalized.  Her exam is unremarkable other than flat and depressed affect.  Will obtain labs, EKG, chest x-ray, CT head due to language barrier.  CBC, BMP are normal.  Troponin is normal.  Do not think she needs a repeat since she had chest pain over a week ago and this test was ordered in triage.  Urine is negative for infection.  CT head is negative.  Chest x-ray is negative.  Discussed results with patient.  She seems overwhelmed with what to do when her blood pressure goes high.  Will also add on TSH due to malaise/fatigue. Encouraged her to follow-up with her doctor and not to obsess over the actual number but focus on her symptoms.  She was given return precautions.  Final Clinical Impressions(s) / ED Diagnoses   Final diagnoses:  Hypertension, unspecified type    ED Discharge Orders    None       Recardo Evangelist, PA-C 07/22/18 [place], Delice Bison, DO 07/22/18 [phone]

## 2018-07-22 NOTE — Discharge Instructions (Signed)
Please follow up with your doctor °Return if worsening °

## 2018-07-22 NOTE — ED Triage Notes (Addendum)
Pt has been feeling bad for several days, reports her bp being high then will drop. Pt was taking 10mg  lisinopril, but had episodes of low blood pressure, now is on 5mg  lisinopril. Pt took an extra dose of lisinopril tonight after getting a headache and noticing her bp was elevated. Pt continues to complain of headache, has had mild chest/epigastric pain with sob that have resolved; also reports dizziness, nausea, and frothy urine, denies dysuria

## 2018-08-01 ENCOUNTER — Telehealth: Payer: Self-pay | Admitting: *Deleted

## 2018-08-01 NOTE — Telephone Encounter (Signed)
LVM using [place] [phone] to see about getting pt scheduled with Dr. Lindell Noe. If she calls back please assist her in getting this appointment. April Zimmerman Rumple, CMA

## 2018-08-01 NOTE — Telephone Encounter (Signed)
-----   Message from Sela Hilding, MD sent at 07/22/2018  4:10 PM EDT ----- Patient was apparently seen in the ED and this lab was ordered but routed to me. [facility] team, could we please schedule an appt to discuss this with patient?

## 2018-08-11 NOTE — Telephone Encounter (Signed)
Tried to contact pt on both numbers, LVM on mobile and the other VM has not been set up.  If she calls back please help her schedule an appointment with PCP.  Used [employer] [phone]. April Zimmerman Rumple, CMA

## 2018-08-13 ENCOUNTER — Emergency Department (HOSPITAL_COMMUNITY)
Admission: EM | Admit: 2018-08-13 | Discharge: 2018-08-13 | Disposition: A | Discharge status: Home / Self Care | Payer: Self-pay | Attending: Emergency Medicine | Admitting: Emergency Medicine

## 2018-08-13 ENCOUNTER — Other Ambulatory Visit: Payer: Self-pay

## 2018-08-13 DIAGNOSIS — E119 Type 2 diabetes mellitus without complications: Secondary | ICD-10-CM | POA: Insufficient documentation

## 2018-08-13 DIAGNOSIS — I10 Essential (primary) hypertension: Secondary | ICD-10-CM | POA: Insufficient documentation

## 2018-08-13 DIAGNOSIS — R110 Nausea: Secondary | ICD-10-CM | POA: Insufficient documentation

## 2018-08-13 DIAGNOSIS — Z87891 Personal history of nicotine dependence: Secondary | ICD-10-CM | POA: Insufficient documentation

## 2018-08-13 DIAGNOSIS — Z03818 Encounter for observation for suspected exposure to other biological agents ruled out: Secondary | ICD-10-CM | POA: Insufficient documentation

## 2018-08-13 DIAGNOSIS — Z794 Long term (current) use of insulin: Secondary | ICD-10-CM | POA: Insufficient documentation

## 2018-08-13 DIAGNOSIS — E039 Hypothyroidism, unspecified: Secondary | ICD-10-CM | POA: Insufficient documentation

## 2018-08-13 DIAGNOSIS — Z79899 Other long term (current) drug therapy: Secondary | ICD-10-CM | POA: Insufficient documentation

## 2018-08-13 DIAGNOSIS — R51 Headache: Secondary | ICD-10-CM | POA: Insufficient documentation

## 2018-08-13 DIAGNOSIS — R519 Headache, unspecified: Secondary | ICD-10-CM

## 2018-08-13 NOTE — ED Triage Notes (Signed)
Pt brought in by [facility] for c/o headache and nausea.  Pt was recently diagnosed with HTN and started on Atenolol.  Pt reports taking Atenolol at 1300 today and felt dizzy with headache and nausea.  Per EMS BP was 190/90 on arrival.  Pt in NAD at this time.

## 2018-08-13 NOTE — ED Triage Notes (Signed)
Pt revealed to me that in the last month she felt suicidal, but did not feel suicidal at this time.  She is upset dealing with her HTN

## 2018-08-13 NOTE — ED Notes (Signed)
Patient verbalizes understanding of discharge instructions. Opportunity for questioning and answers were provided. Armband removed by staff, pt discharged from ED.  

## 2018-08-13 NOTE — ED Provider Notes (Signed)
[facility] EMERGENCY DEPARTMENT Provider Note   CSN: [id number] Arrival date & time: 08/13/18  1546    History   Chief Complaint Chief Complaint  Patient presents with  . Headache  . Nausea    HPI Brittany Pineda is a 63 y.o. female.     HPI   63yo female with history of  Hypertension, DM, hyperlipidemia presents with concern for headache, nausea, and changes in blood pressures.  Reports that she was just started on atenolol recently.    Was taking medication 65m (lisinopril) in January and began to feel badly, headache, dizziness, thought it was blood pressure and was 160s, went to an urgent care , BP and EKG was ok and was told to see her Dr if she was having symptoms again.  Has been keeping a diary of her blood pressures.  Dr noticed she was taking BP every morning and would drop in the AM.  Dr changed medication down to 528mlisinopril BID, but took it only for a few days, then stopped the evening dose because feeling dizzy.  Lately have been feeling dizzy the whole day.  Normally without the medication, takes BP and it is 105, but later will go up above 180.  Reports for last few days has had higher blood pressures, lightheadedness, headache, nausea.  Headache 4-5/10.   Dizziness is lightheadedness, is there constantly. No numbness/weakness/trouble talking or walking.  Reports has been to urgent care 3 times with high blood pressure.  Feeling worse and worse every day.  Reports no control with blood pressures, is only getting worse.  This AM woke up having symptoms so thought BP was high and took it like she used to and BP was still higher.     Spanish interpreter used  Past Medical History:  Diagnosis Date  . Diabetes mellitus   . Hyperlipidemia   . Thyroid disease     Patient Active Problem List   Diagnosis Date Noted  . Diabetic polyneuropathy associated with type 2 diabetes mellitus ([place]/15/2019  . Essential hypertension  10/22/2016  . Onychomycosis 04/07/2016  . Depression 04/03/2014  . Hypothyroidism 03/13/2014  . Hyperlipidemia 07/22/2011  . Poorly controlled type 2 diabetes mellitus ([facility]/01/2012    Past Surgical History:  Procedure Laterality Date  . ABDOMINAL HYSTERECTOMY    . CHOLECYSTECTOMY       OB History   No obstetric history on file.      Home Medications    Prior to Admission medications   Medication Sig Start Date End Date Taking? Authorizing Provider  Blood Glucose Monitoring Suppl (AGAMATRIX PRESTO) w/Device KIT Use as instructed for PrestoWaveSense meter.  Dispense QS for testing up to thrice daily.  Dx E11.9 04/07/16   EnKinnie FeilMD  gabapentin (NEURONTIN) 100 MG capsule Take 1 capsule (100 mg total) by mouth at bedtime. Patient not taking: Reported on 05/31/2018 03/09/17   TiSela HildingMD  glucose blood test strip Use as instructed for PrestoWaveSense meter.  Dispense QS for testing up to thrice daily.  Dx E11.9 09/11/16   TiSela HildingMD  insulin NPH-regular Human (NOVOLIN 70/30) (70-30) 100 UNIT/ML injection 18 units in the morning and 10 units before her evening meal. Patient taking differently: Inject 10-18 Units into the skin See admin instructions. Inject 18 units in the morning and 10 units before her evening meal. 02/23/17   TiSela HildingMD  Lancets MISC Dispense quantity sufficient for thrice daily testing. 09/11/16   TiLindell Noe  Curt Bears, MD  levothyroxine (SYNTHROID, LEVOTHROID) 112 MCG tablet Take 1 tablet (112 mcg total) by mouth daily before breakfast. 03/09/17   Sela Hilding, MD  lisinopril (PRINIVIL,ZESTRIL) 10 MG tablet TAKE 1 TABLET BY MOUTH ONCE DAILY Patient taking differently: Take 5 mg by mouth daily.  09/08/17   Sela Hilding, MD  metFORMIN (GLUCOPHAGE) 850 MG tablet Take 1 tablet (850 mg total) by mouth 3 (three) times daily. 02/23/17   Sela Hilding, MD  metFORMIN (GLUCOPHAGE) 850 MG tablet TAKE 1 TABLET BY MOUTH  THREE TIMES DAILY Patient not taking: Reported on 05/31/2018 09/28/17   Sela Hilding, MD  RELION SHORT PEN NEEDLES 31G X 8 MM MISC USE AS DIRECTED ONCE DAILY 07/09/15   Rosemarie Ax, MD  simvastatin (ZOCOR) 40 MG tablet Take 1 tablet (40 mg total) by mouth at bedtime. 02/23/17   Sela Hilding, MD    Family History Family History  Problem Relation Age of Onset  . Alcohol abuse Father   . Hyperlipidemia Sister   . Depression Brother     Social History Social History   Tobacco Use  . Smoking status: Former Research scientist (life sciences)  . Smokeless tobacco: Never Used  . Tobacco comment: Stopped in 1996  Substance Use Topics  . Alcohol use: No    Alcohol/week: 0.0 standard drinks  . Drug use: No     Allergies   Patient has no known allergies.   Review of Systems Review of Systems  Constitutional: Negative for appetite change and fever.  HENT: Negative for sore throat.   Eyes: Negative for visual disturbance.  Respiratory: Negative for cough and shortness of breath.   Cardiovascular: Negative for chest pain.  Gastrointestinal: Positive for nausea. Negative for abdominal pain, diarrhea and vomiting.  Genitourinary: Negative for difficulty urinating.  Musculoskeletal: Negative for back pain and neck pain.  Skin: Negative for rash.  Neurological: Positive for dizziness and headaches. Negative for syncope, facial asymmetry, speech difficulty, weakness and numbness.     Physical Exam Updated Vital Signs BP 129/78   Pulse 77   Temp 98 F (36.7 C) (Oral)   Resp 15   Ht _0  (1.6 m)   Wt 63.5 kg   SpO2 100%   BMI 24.80 kg/m   Physical Exam Vitals signs and nursing note reviewed.  Constitutional:      General: She is not in acute distress.    Appearance: She is well-developed. She is not diaphoretic.  HENT:     Head: Normocephalic and atraumatic.  Eyes:     Conjunctiva/sclera: Conjunctivae normal.  Neck:     Musculoskeletal: Normal range of motion.  Cardiovascular:      Rate and Rhythm: Normal rate and regular rhythm.  Pulmonary:     Effort: Pulmonary effort is normal. No respiratory distress.     Breath sounds: Normal breath sounds.  Abdominal:     General: There is no distension.     Palpations: Abdomen is soft.     Tenderness: There is no abdominal tenderness. There is no guarding.  Musculoskeletal:        General: No tenderness.  Skin:    General: Skin is warm and dry.     Findings: No erythema or rash.  Neurological:     Mental Status: She is alert and oriented to person, place, and time.     GCS: GCS eye subscore is 4. GCS verbal subscore is 5. GCS motor subscore is 6.     Cranial Nerves: Cranial nerves are intact.  No cranial nerve deficit or dysarthria.     Sensory: Sensation is intact. No sensory deficit.     Motor: Motor function is intact. No weakness.     Coordination: Coordination is intact. Coordination normal.     Gait: Gait is intact.      ED Treatments / Results  Labs (all labs ordered are listed, but only abnormal results are displayed) Labs Reviewed  NOVEL CORONAVIRUS, [facility] ORDER, SEND-OUT TO REF LAB)    EKG EKG Interpretation  Date/Time:  Saturday August 13 2018 15:55:47 EDT Ventricular Rate:  68 PR Interval:    QRS Duration: 76 QT Interval:  377 QTC Calculation: 401 R Axis:   4 Text Interpretation:  Sinus rhythm No significant change since last tracing Confirmed by Gareth Morgan [id number]) on 08/13/2018 4:36:14 PM   Radiology No results found.  Procedures Procedures (including critical care time)  Medications Ordered in ED Medications - No data to display   Initial Impression / Assessment and Plan / ED Course  I have reviewed the triage vital signs and the nursing notes.  Pertinent labs & imaging results that were available during my care of the patient were reviewed by me and considered in my medical decision making (see chart for details).        63yo female with history of  Hypertension,  DM, hyperlipidemia presents with concern for headache, nausea, and changes in blood pressures.  Patient without severe headache, no neurologic symptoms, no chest pain, no shortness of breath and have low suspicion for hypertensive emergencies including low suspicion for [facility], hypertensive encephalopathy, stroke, MI, aortic dissection, pulmonary edema.  Her blood pressures are mostly normal in the ED with the exceptino of one or two 350 systolic.  She has ongoing symptoms while in the ED and noted to have palpitations with normal sinus rhythm on monitor.  She had recent lab work for similar symptoms and given no change including no bleeding hx of vomiting I do not feel there is much utility in repeating today. She was recently diagnosed and treated for UTI an is no longer having urinary symptoms and do not feel urinalysis indicated at this time.  No hx of trauma or fever, doubt ICH or meningitis. Neurologic exam normal. Will order COVID19 testing given fatigue, lightheadedness and nausea.  Discussed importance of close primary care follow up and reasons to return to the ED in detail. Patient discharged in stable condition with understanding of reasons to return.       Final Clinical Impressions(s) / ED Diagnoses   Final diagnoses:  Essential hypertension  Nausea  Acute nonintractable headache, unspecified headache type    ED Discharge Orders    None       Gareth Morgan, MD 08/14/18 [phone]

## 2018-08-14 ENCOUNTER — Emergency Department (HOSPITAL_COMMUNITY): Payer: Self-pay

## 2018-08-14 ENCOUNTER — Other Ambulatory Visit: Payer: Self-pay

## 2018-08-14 ENCOUNTER — Encounter (HOSPITAL_COMMUNITY): Payer: Self-pay | Admitting: *Deleted

## 2018-08-14 ENCOUNTER — Emergency Department (HOSPITAL_COMMUNITY)
Admission: EM | Admit: 2018-08-14 | Discharge: 2018-08-14 | Disposition: A | Discharge status: Home / Self Care | Payer: Self-pay | Attending: Emergency Medicine | Admitting: Emergency Medicine

## 2018-08-14 DIAGNOSIS — E11649 Type 2 diabetes mellitus with hypoglycemia without coma: Secondary | ICD-10-CM | POA: Insufficient documentation

## 2018-08-14 DIAGNOSIS — R42 Dizziness and giddiness: Secondary | ICD-10-CM | POA: Insufficient documentation

## 2018-08-14 DIAGNOSIS — R531 Weakness: Secondary | ICD-10-CM | POA: Insufficient documentation

## 2018-08-14 DIAGNOSIS — I10 Essential (primary) hypertension: Secondary | ICD-10-CM | POA: Insufficient documentation

## 2018-08-14 DIAGNOSIS — Z794 Long term (current) use of insulin: Secondary | ICD-10-CM | POA: Insufficient documentation

## 2018-08-14 DIAGNOSIS — E1165 Type 2 diabetes mellitus with hyperglycemia: Secondary | ICD-10-CM

## 2018-08-14 DIAGNOSIS — E039 Hypothyroidism, unspecified: Secondary | ICD-10-CM | POA: Insufficient documentation

## 2018-08-14 DIAGNOSIS — Z79899 Other long term (current) drug therapy: Secondary | ICD-10-CM | POA: Insufficient documentation

## 2018-08-14 HISTORY — DX: Essential (primary) hypertension: I10

## 2018-08-14 LAB — BASIC METABOLIC PANEL
Anion gap: 10 (ref 5–15)
BUN: 14 mg/dL (ref 6–20)
CO2: 22 mmol/L (ref 22–32)
Calcium: 9 mg/dL (ref 8.9–10.3)
Chloride: 98 mmol/L (ref 98–111)
Creatinine, Ser: 0.85 mg/dL (ref 0.44–1.00)
GFR calc Af Amer: >60 mL/min (ref >60)
GFR calc non Af Amer: >60 mL/min (ref >60)
Glucose, Bld: 107 mg/dL — ABNORMAL HIGH (ref 70–99)
Potassium: 4.1 mmol/L (ref 3.5–5.1)
Sodium: 130 mmol/L — ABNORMAL LOW (ref 135–145)

## 2018-08-14 LAB — CBC WITH DIFFERENTIAL/PLATELET
Abs Immature Granulocytes: 0.01 10*3/uL (ref 0.00–0.07)
Basophils Absolute: 0.1 10*3/uL (ref 0.0–0.1)
Basophils Relative: 1 %
Eosinophils Absolute: 0.3 10*3/uL (ref 0.0–0.5)
Eosinophils Relative: 4 %
HCT: 34.1 % — ABNORMAL LOW (ref 36.0–46.0)
Hemoglobin: 11.8 g/dL — ABNORMAL LOW (ref 12.0–15.0)
Immature Granulocytes: 0 %
Lymphocytes Relative: 23 %
Lymphs Abs: 1.9 10*3/uL (ref 0.7–4.0)
MCH: 32.6 pg (ref 26.0–34.0)
MCHC: 34.6 g/dL (ref 30.0–36.0)
MCV: 94.2 fL (ref 80.0–100.0)
Monocytes Absolute: 0.5 10*3/uL (ref 0.1–1.0)
Monocytes Relative: 7 %
Neutro Abs: 5.4 10*3/uL (ref 1.7–7.7)
Neutrophils Relative %: 65 %
Platelets: 312 10*3/uL (ref 150–400)
RBC: 3.62 MIL/uL — ABNORMAL LOW (ref 3.87–5.11)
RDW: 11.8 % (ref 11.5–15.5)
WBC: 8.2 10*3/uL (ref 4.0–10.5)
nRBC: 0 % (ref 0.0–0.2)

## 2018-08-14 LAB — TROPONIN I (HIGH SENSITIVITY): Troponin I (High Sensitivity): <2 ng/L (ref <18)

## 2018-08-14 MED ORDER — INSULIN NPH ISOPHANE & REGULAR (70-30) 100 UNIT/ML ~~LOC~~ SUSP: SUBCUTANEOUS | 0 refills | Status: DC

## 2018-08-14 NOTE — Discharge Instructions (Signed)
Please take 9 units of insulin in the morning 30 minutes before breakfast and 5 units in the evening 30 minutes before dinner. This is half the dose you previously took. Only take if you are eating 3 meals that day. Do not take insulin if you eat irregular meals.   Continue taking Metformin three times a day.  I made an appointment with your primary  care doctor tomorrow July 6th at 1:55pm at the [facility] [address] [place], [place] [address] [phone].  Come back to the emergency room for new or worsening symptoms.   Tome 9 unidades de insulina por la maana 30 minutos antes del desayuno y 5 unidades por la noche 30 minutos antes de la cena. Esta es la mitad de la dosis que tom anteriormente. Solo tome si est comiendo 3 comidas ese da. No tome insulina si come comidas irregulares.   Contine tomando metformina tres Personnel officer.  Hice una cita con su mdico de atencin primaria maana 6 de julio a las 1:55 pm en [facility] [address] [place], [place] [address] [phone].   Regrese a la sala de emergencias por sntomas nuevos o que empeoran.

## 2018-08-14 NOTE — ED Triage Notes (Addendum)
Pt here via [facility] from home for acute onset chest pressure accompanied by nausea at 0350.  132/80, hr 84, cbg 150.  ekg unremarkable.  324 asa and 1 nitro given en-route and pt is no longer pain free.  Was seen by ems today for bp.  Pt states her blood sugar was 29 this am and this is how she was feeling then.

## 2018-08-14 NOTE — ED Notes (Signed)
PT states understanding of care given, follow up care, and medication prescribed. PT is ambulated from ED to car with a steady gait.  

## 2018-08-14 NOTE — ED Provider Notes (Signed)
[facility] EMERGENCY DEPARTMENT Provider Note   CSN: [id number] Arrival date & time: 08/14/18  1649     History   Chief Complaint Chief Complaint  Patient presents with   Chest Pain    HPI Brittany Pineda is a 63 y.o. female.     HPI  Patient is a 63 year old female with past medical history of type 2 diabetes mellitus, hypertension, hyperlipidemia, hypothyroidism presenting for episode of feeling diaphoretic, lightheaded, hypoglycemia, and generally feeling unwell.  Patient reports that over the past couple months she has had labile blood pressures and blood sugars.  She reports that this morning around 3:50 AM she felt very diaphoretic, shaky and unwell.  She checked her blood sugar and it was 27.  Her blood pressure at that time was 169 systolic.  Patient reports that her fasting blood sugars typically run in the low 100s.  She ate pieces of watermelon this morning after this episode, and called EMS.  Hypoglycemia had normalized by the time they arrived.  They took an EKG but she did not transport to the emergency department.  Later in the evening, she felt the same sensations however her glucose was normal.  EMS transported her to the emergency department and patient did have a complaint of possible chest pressure.  She reports all her symptoms have resolved by the time she arrived in the emergency department.  Patient reports that she was started on a medication called atenolol.  This was not done in her primary care provider's office as there is no note of this.  She feels it is making her dizzy.  Patient reports that she has lost 15 pounds in the last couple months and is not eating regularly.  She is concerned that her diet is causing her labile blood pressures so she admits she is eating poorly as a result of this.  History obtained with the assistance of [place] interpreter.  Past Medical History:  Diagnosis Date   Diabetes mellitus      Hyperlipidemia    Hypertension    Thyroid disease     Patient Active Problem List   Diagnosis Date Noted   Diabetic polyneuropathy associated with type 2 diabetes mellitus ([place]) 02/23/2017   Essential hypertension 10/22/2016   Onychomycosis 04/07/2016   Depression 04/03/2014   Hypothyroidism 03/13/2014   Hyperlipidemia 07/22/2011   Poorly controlled type 2 diabetes mellitus ([place]) 07/22/2011    Past Surgical History:  Procedure Laterality Date   ABDOMINAL HYSTERECTOMY     CHOLECYSTECTOMY       OB History   No obstetric history on file.      Home Medications    Prior to Admission medications   Medication Sig Start Date End Date Taking? Authorizing Provider  Blood Glucose Monitoring Suppl (AGAMATRIX PRESTO) w/Device KIT Use as instructed for PrestoWaveSense meter.  Dispense QS for testing up to thrice daily.  Dx E11.9 04/07/16   Kinnie Feil, MD  gabapentin (NEURONTIN) 100 MG capsule Take 1 capsule (100 mg total) by mouth at bedtime. Patient not taking: Reported on 05/31/2018 03/09/17   Sela Hilding, MD  glucose blood test strip Use as instructed for PrestoWaveSense meter.  Dispense QS for testing up to thrice daily.  Dx E11.9 09/11/16   Sela Hilding, MD  insulin NPH-regular Human (NOVOLIN 70/30) (70-30) 100 UNIT/ML injection 18 units in the morning and 10 units before her evening meal. Patient taking differently: Inject 10-18 Units into the skin See admin instructions. Inject 18  units in the morning and 10 units before her evening meal. 02/23/17   Sela Hilding, MD  Lancets MISC Dispense quantity sufficient for thrice daily testing. 09/11/16   Sela Hilding, MD  levothyroxine (SYNTHROID, LEVOTHROID) 112 MCG tablet Take 1 tablet (112 mcg total) by mouth daily before breakfast. 03/09/17   Sela Hilding, MD  lisinopril (PRINIVIL,ZESTRIL) 10 MG tablet TAKE 1 TABLET BY MOUTH ONCE DAILY Patient taking differently: Take 5 mg by mouth daily.   09/08/17   Sela Hilding, MD  metFORMIN (GLUCOPHAGE) 850 MG tablet Take 1 tablet (850 mg total) by mouth 3 (three) times daily. 02/23/17   Sela Hilding, MD  metFORMIN (GLUCOPHAGE) 850 MG tablet TAKE 1 TABLET BY MOUTH THREE TIMES DAILY Patient not taking: Reported on 05/31/2018 09/28/17   Sela Hilding, MD  RELION SHORT PEN NEEDLES 31G X 8 MM MISC USE AS DIRECTED ONCE DAILY 07/09/15   Rosemarie Ax, MD  simvastatin (ZOCOR) 40 MG tablet Take 1 tablet (40 mg total) by mouth at bedtime. 02/23/17   Sela Hilding, MD    Family History Family History  Problem Relation Age of Onset   Alcohol abuse Father    Hyperlipidemia Sister    Depression Brother     Social History Social History   Tobacco Use   Smoking status: Former Smoker   Smokeless tobacco: Never Used   Tobacco comment: Stopped in 1996  Substance Use Topics   Alcohol use: No    Alcohol/week: 0.0 standard drinks   Drug use: No     Allergies   Patient has no known allergies.   Review of Systems Review of Systems  Constitutional: Positive for diaphoresis. Negative for chills and fever.  HENT: Negative for congestion and sore throat.   Eyes: Negative for visual disturbance.  Respiratory: Negative for cough, chest tightness and shortness of breath.   Cardiovascular: Negative for chest pain, palpitations and leg swelling.  Gastrointestinal: Negative for abdominal pain, nausea and vomiting.  Genitourinary: Negative for dysuria and flank pain.  Musculoskeletal: Negative for back pain and myalgias.  Skin: Negative for rash.  Neurological: Positive for weakness and light-headedness. Negative for dizziness, syncope and headaches.       +Generalized weakness.     Physical Exam Updated Vital Signs BP 137/79    Pulse 78    Temp 98.1 F (36.7 C) (Oral)    Resp 15    Ht 5' 0.63" (1.54 m)    Wt 63.5 kg    SpO2 99%    BMI 26.78 kg/m   Physical Exam Vitals signs and nursing note reviewed.    Constitutional:      General: She is not in acute distress.    Appearance: She is well-developed. She is not ill-appearing or diaphoretic.  HENT:     Head: Normocephalic and atraumatic.  Eyes:     Conjunctiva/sclera: Conjunctivae normal.     Pupils: Pupils are equal, round, and reactive to light.  Neck:     Musculoskeletal: Normal range of motion and neck supple.  Cardiovascular:     Rate and Rhythm: Normal rate and regular rhythm.     Heart sounds: S1 normal and S2 normal. No murmur.  Pulmonary:     Effort: Pulmonary effort is normal.     Breath sounds: Normal breath sounds. No wheezing or rales.  Abdominal:     General: There is no distension.     Palpations: Abdomen is soft.     Tenderness: There is no abdominal tenderness. There  is no guarding.  Musculoskeletal: Normal range of motion.        General: No deformity.  Lymphadenopathy:     Cervical: No cervical adenopathy.  Skin:    General: Skin is warm and dry.     Findings: No erythema or rash.  Neurological:     Mental Status: She is alert.     Comments: Cranial nerves grossly intact. Patient moves extremities symmetrically and with good coordination.  Psychiatric:        Behavior: Behavior normal.        Thought Content: Thought content normal.        Judgment: Judgment normal.      ED Treatments / Results  Labs (all labs ordered are listed, but only abnormal results are displayed) Labs Reviewed  BASIC METABOLIC PANEL - Abnormal; Notable for the following components:      Result Value   Sodium 130 (*)    Glucose, Bld 107 (*)    All other components within normal limits  CBC WITH DIFFERENTIAL/PLATELET - Abnormal; Notable for the following components:   RBC 3.62 (*)    Hemoglobin 11.8 (*)    HCT 34.1 (*)    All other components within normal limits  TROPONIN I (HIGH SENSITIVITY)  TROPONIN I (HIGH SENSITIVITY)    EKG EKG Interpretation  Date/Time:  Sunday August 14 2018 17:47:56 EDT Ventricular Rate:   75 PR Interval:    QRS Duration: 77 QT Interval:  370 QTC Calculation: 414 R Axis:   -7 Text Interpretation:  Sinus rhythm No significant change since last tracing Confirmed by Quintella Reichert [phone]) on 08/14/2018 6:02:11 PM   Radiology Dg Chest Portable 1 View  Result Date: 08/14/2018 CLINICAL DATA:  Chest pain and nausea EXAM: PORTABLE CHEST 1 VIEW COMPARISON:  July 22, 2018 FINDINGS: The heart size and mediastinal contours are within normal limits. Both lungs are clear. The visualized skeletal structures are unremarkable. IMPRESSION: No active disease. Electronically Signed   By: Dorise Bullion III M.D   On: 08/14/2018 18:04    Procedures Procedures (including critical care time)  Medications Ordered in ED Medications - No data to display   Initial Impression / Assessment and Plan / ED Course  I have reviewed the triage vital signs and the nursing notes.  Pertinent labs & imaging results that were available during my care of the patient were reviewed by me and considered in my medical decision making (see chart for details).  Clinical Course as of Aug 13 2057  Nancy Fetter Aug 14, 2018  1941 Will obtain single troponin.    [AM]    Clinical Course User Index [AM] Albesa Seen, PA-C       This is a well-appearing 63 year old female with past medical history of hypertension, type 2 diabetes mellitus, hypothyroidism, depression presenting for generalized weakness, hypoglycemic episodes, and labile blood pressures.  She has had several evaluations for elevated blood pressure and the symptoms in the past couple months.  On further questioning of the patient, she reveals that she has been losing weight and not eating well.  She has self decreased her insulin doses and yesterday only took a total of 15 units of the Novolin 70/30.  I discussed with the patient that perhaps she is still getting too much insulin with the 850 metformin 3 times daily and Novolin 70/30.  Regarding patient's  blood pressure medication, I have no documented record of where she received the atenolol.  It appears she has not been  in clinic in approximately a year and a half.  Will consult family medicine and pharmacy on medication management for this patient.  Work-up demonstrating mild hyponatremia of 130.  Troponin negative.  EKG normal sinus rhythm without evidence of acute ischemia, infarction, or arrhythmia.  Chest x-ray without cardiopulmonary disease.  Stable troponin obtained due to recent negative cardiovascular work-up, and symptoms that seem tied to hypoglycemia.  Family medicine residents consulted to assist with obtaining appointment and medication management. Pharmacy is also consulted regarding insulin management. They recommend decreasing Novolin 70/30 to 9 units in AM and 5 units in PM. Will have patient hold insulin if she does not eat regularly that day. She will continue to take metformin 3 times daily.  Patient will follow-up tomorrow afternoon for a family medicine residency clinic visit to reestablish care.  Patient was instructed on the importance of eating 3 square meals a day and good nutrition.  I discussed patient's medication changes with her with Spanish interpreter.  She is encouraged to return for new or worsening symptoms.  This is a supervised visit with Dr. Gareth Morgan. Evaluation, management, and discharge planning discussed with this attending physician.  Final Clinical Impressions(s) / ED Diagnoses   Final diagnoses:  Hypoglycemic episode in patient with diabetes mellitus ([place])  Generalized weakness    ED Discharge Orders         Ordered    insulin NPH-regular Human (70-30) 100 UNIT/ML injection    Note to Pharmacy: [facility] Department   08/14/18 2229           Tamala Julian 08/14/18 2246    Gareth Morgan, MD 08/19/18 1927

## 2018-08-15 ENCOUNTER — Telehealth (INDEPENDENT_AMBULATORY_CARE_PROVIDER_SITE_OTHER): Payer: Self-pay | Admitting: Family Medicine

## 2018-08-15 DIAGNOSIS — E039 Hypothyroidism, unspecified: Secondary | ICD-10-CM

## 2018-08-15 DIAGNOSIS — I10 Essential (primary) hypertension: Secondary | ICD-10-CM

## 2018-08-15 DIAGNOSIS — E1165 Type 2 diabetes mellitus with hyperglycemia: Secondary | ICD-10-CM

## 2018-08-15 DIAGNOSIS — E782 Mixed hyperlipidemia: Secondary | ICD-10-CM

## 2018-08-15 LAB — NOVEL CORONAVIRUS, NAA (HOSP ORDER, SEND-OUT TO REF LAB; TAT 18-24 HRS): SARS-CoV-2, NAA: NOT DETECTED

## 2018-08-15 NOTE — Assessment & Plan Note (Signed)
Patient says she recently switched from lisinopril to atenolol.  Uncertain who switch her to this medication.  It was not started by her PCP or at the family medicine clinic.  She was on 10 mg followed by 5 mg of lisinopril prior to this.  Again it is uncertain who switch her to from 10 to 5 mg.  Patient complains of episodes of hypertension associated with nausea, headache, dizziness but patient has not had any documented blood pressures above 595 systolic in epic.  Patient states this is because she has medication in the ambulance prior to having her blood pressure read in the emergency department, which could be true.  Patient has not had labs in over a year.  Told patient that I would need to get a accurate blood pressure reading on her and therefore would need to see her in the office.  In the meantime advised patient to continue taking her blood pressure medicine. - Follow-up on July 13 in person appointment.   - BMP at next visit - Put patient on a more appropriate blood pressure medication during next visit as she has no indication for atenolol.

## 2018-08-15 NOTE — Assessment & Plan Note (Signed)
Uncertain who it is exactly that prescribed this new dose of Synthroid to the patient.  Her most recent TSH was markedly elevated compared to previous levels.  Prior to [facility] were less than 1, while TSH on June 12 was 8.  Advised patient to continue taking this medication as prescribed until I can see her in the office.  Patient says physicians name was Dr. Deatra Robinson, but unable to find any such physician in the [facility] area - TSH, T3, T4 at next visit - Increase Synthroid appropriately after lab work. - Have patient bring in all of her medications to evaluate and see who is prescribing them to her

## 2018-08-15 NOTE — Assessment & Plan Note (Signed)
Patient comes that she had a blood sugar of 27 the other night.  Although I doubt the accuracy of this number, it is apparent based on the previously recorded blood glucose levels and her epic chart.this patient is likely too tightly controlled and prone to episodes of hypoglycemia.  Advised patient to stop taking the insulin completely until we can assess her in the office. - A1c at next visit - Continue taking metformin 3 times a day as prescribed, but seriously consider decreasing to twice daily dosing after evaluating her at next in-person visit. -Patient has A1c that is 7 or less at next visit consider discontinuing insulin altogether or at least reducing to once a day dosing

## 2018-08-15 NOTE — Telephone Encounter (Signed)
Pt has appointment today with PCP. Brittany Pineda, CMA   

## 2018-08-15 NOTE — Progress Notes (Signed)
[facility]  Patient consented to have virtual visit. Method of visit: Video was attempted, but technology challenges prevented patient from using video, so visit was conducted via telephone.  Encounter participants: Patient: Brittany Pineda - located at home Provider: Sandre Kittyaniel K Naara Kelty [facility] Others (if applicable): [place] interpreter  Chief Complaint: Low blood sugar, high blood pressure  HPI: Hypoglycemia: Patient went to the emergency department 2 nights ago because she says she had a blood sugar of 27 in the middle the night.  In the emergency department her blood sugar was in the low 100s.  After the emergency department she was feeling hypoglycemic again and she went to the urgent care.  She says they told her to change her diet.  Last night she did not take her nightly insulin because she was afraid her blood sugar get too low.  She did not check her sugar.  This morning she woke up and checked her blood sugar and it was 120 after not having taken any insulin overnight.  She states that she used to take 18 units of 70/30 mix in the morning and 12 units at night, but 2 months ago she was switched to taking 8 units in the morning and 5 units at night.  She also started taking her metformin 850 mg 3 times a day.  Hypertension: Patient states that she has had multiple episodes of high blood pressure, in which she has had to be taken to the hospital by ambulance.  She says that in the ambulance they gave her a blood pressure medication and by the time she gets to the hospital it is normal again.  She says when her blood pressure gets elevated she gets dizzy, her veins are swollen, and it feels like her head is going to explode.  She says for the past 3 days her blood pressures been going up.  She states that her blood pressure was 1 70-1 80 yesterday.  She has been taking atenolol 25 mg.  She used to be on lisinopril 10 mg and was  then switched to 5 mg lisinopril, but now she currently only takes atenolol.  Hypothyroidism: Patient been taking Synthroid for a long time but recently had to go to see a different provider to get her Synthroid because of insurance issues.  She says that this new doctor reduced her dose of Synthroid from 100 to 50 mcg a day.  She says the name of this doctor is Dr. Laurine BlazerFaison Alfonso   ROS: per HPI  Pertinent PMHx: Hypothyroidism, hypertension, diabetes mellitus  Exam:  Respiratory: Patient able to speak in full sentences.  Assessment/Plan:  Essential hypertension Patient says she recently switched from lisinopril to atenolol.  Uncertain who switch her to this medication.  It was not started by her PCP or at the family medicine clinic.  She was on 10 mg followed by 5 mg of lisinopril prior to this.  Again it is uncertain who switch her to from 10 to 5 mg.  Patient complains of episodes of hypertension associated with nausea, headache, dizziness but patient has not had any documented blood pressures above 140 systolic in epic.  Patient states this is because she has medication in the ambulance prior to having her blood pressure read in the emergency department, which could be true.  Patient has not had labs in over a year.  Told patient that I would need to get a accurate blood pressure reading on  her and therefore would need to see her in the office.  In the meantime advised patient to continue taking her blood pressure medicine. - Follow-up on July 13 in person appointment.   - BMP at next visit - Put patient on a more appropriate blood pressure medication during next visit as she has no indication for atenolol.  Poorly controlled type 2 diabetes mellitus Patient comes that she had a blood sugar of 27 the other night.  Although I doubt the accuracy of this number, it is apparent based on the previously recorded blood glucose levels and her epic chart.this patient is likely too tightly controlled  and prone to episodes of hypoglycemia.  Advised patient to stop taking the insulin completely until we can assess her in the office. - A1c at next visit - Continue taking metformin 3 times a day as prescribed, but seriously consider decreasing to twice daily dosing after evaluating her at next in-person visit. -Patient has A1c that is 7 or less at next visit consider discontinuing insulin altogether or at least reducing to once a day dosing  Hypothyroidism Uncertain who it is exactly that prescribed this new dose of Synthroid to the patient.  Her most recent TSH was markedly elevated compared to previous levels.  Prior to [facility] were less than 1, while TSH on June 12 was 8.  Advised patient to continue taking this medication as prescribed until I can see her in the office.  Patient says physicians name was Dr. Deatra Robinson, but unable to find any such physician in the [facility] area - TSH, T3, T4 at next visit - Increase Synthroid appropriately after lab work. - Have patient bring in all of her medications to evaluate and see who is prescribing them to her    Time spent during visit with patient: 50 minutes

## 2018-08-20 ENCOUNTER — Emergency Department (HOSPITAL_COMMUNITY)
Admission: EM | Admit: 2018-08-20 | Discharge: 2018-08-20 | Disposition: A | Discharge status: Home / Self Care | Payer: Self-pay | Attending: Emergency Medicine | Admitting: Emergency Medicine

## 2018-08-20 ENCOUNTER — Encounter (HOSPITAL_COMMUNITY): Payer: Self-pay | Admitting: Emergency Medicine

## 2018-08-20 ENCOUNTER — Encounter (HOSPITAL_COMMUNITY): Payer: Self-pay | Admitting: Student

## 2018-08-20 ENCOUNTER — Other Ambulatory Visit: Payer: Self-pay

## 2018-08-20 DIAGNOSIS — I10 Essential (primary) hypertension: Secondary | ICD-10-CM | POA: Insufficient documentation

## 2018-08-20 DIAGNOSIS — R030 Elevated blood-pressure reading, without diagnosis of hypertension: Secondary | ICD-10-CM

## 2018-08-20 DIAGNOSIS — Z79899 Other long term (current) drug therapy: Secondary | ICD-10-CM | POA: Insufficient documentation

## 2018-08-20 DIAGNOSIS — Z87891 Personal history of nicotine dependence: Secondary | ICD-10-CM | POA: Insufficient documentation

## 2018-08-20 DIAGNOSIS — R110 Nausea: Secondary | ICD-10-CM | POA: Insufficient documentation

## 2018-08-20 DIAGNOSIS — E119 Type 2 diabetes mellitus without complications: Secondary | ICD-10-CM | POA: Insufficient documentation

## 2018-08-20 DIAGNOSIS — E039 Hypothyroidism, unspecified: Secondary | ICD-10-CM | POA: Insufficient documentation

## 2018-08-20 DIAGNOSIS — Z794 Long term (current) use of insulin: Secondary | ICD-10-CM | POA: Insufficient documentation

## 2018-08-20 DIAGNOSIS — E785 Hyperlipidemia, unspecified: Secondary | ICD-10-CM | POA: Insufficient documentation

## 2018-08-20 DIAGNOSIS — R1084 Generalized abdominal pain: Secondary | ICD-10-CM | POA: Insufficient documentation

## 2018-08-20 LAB — URINALYSIS, ROUTINE W REFLEX MICROSCOPIC
Bilirubin Urine: NEGATIVE
Glucose, UA: NEGATIVE mg/dL
Hgb urine dipstick: NEGATIVE
Ketones, ur: NEGATIVE mg/dL
Leukocytes,Ua: NEGATIVE
Nitrite: NEGATIVE
Protein, ur: NEGATIVE mg/dL
Specific Gravity, Urine: 1.003 — ABNORMAL LOW (ref 1.005–1.030)
pH: 7 (ref 5.0–8.0)

## 2018-08-20 LAB — CBC WITH DIFFERENTIAL/PLATELET
Abs Immature Granulocytes: 0.02 10*3/uL (ref 0.00–0.07)
Abs Immature Granulocytes: 0.01 10*3/uL (ref 0.00–0.07)
Basophils Absolute: 0.2 10*3/uL — ABNORMAL HIGH (ref 0.0–0.1)
Basophils Absolute: 0.1 10*3/uL (ref 0.0–0.1)
Basophils Relative: 1 %
Basophils Relative: 1 %
Eosinophils Absolute: 0.5 10*3/uL (ref 0.0–0.5)
Eosinophils Absolute: 0.3 10*3/uL (ref 0.0–0.5)
Eosinophils Relative: 4 %
Eosinophils Relative: 4 %
HCT: 35.1 % — ABNORMAL LOW (ref 36.0–46.0)
HCT: 33.6 % — ABNORMAL LOW (ref 36.0–46.0)
Hemoglobin: 11.7 g/dL — ABNORMAL LOW (ref 12.0–15.0)
Hemoglobin: 11.6 g/dL — ABNORMAL LOW (ref 12.0–15.0)
Immature Granulocytes: 0 %
Immature Granulocytes: 0 %
Lymphocytes Relative: 28 %
Lymphocytes Relative: 23 %
Lymphs Abs: 3.2 10*3/uL (ref 0.7–4.0)
Lymphs Abs: 1.9 10*3/uL (ref 0.7–4.0)
MCH: 32.3 pg (ref 26.0–34.0)
MCH: 32.6 pg (ref 26.0–34.0)
MCHC: 33.3 g/dL (ref 30.0–36.0)
MCHC: 34.5 g/dL (ref 30.0–36.0)
MCV: 97 fL (ref 80.0–100.0)
MCV: 94.4 fL (ref 80.0–100.0)
Monocytes Absolute: 0.9 10*3/uL (ref 0.1–1.0)
Monocytes Absolute: 0.6 10*3/uL (ref 0.1–1.0)
Monocytes Relative: 8 %
Monocytes Relative: 7 %
Neutro Abs: 6.7 10*3/uL (ref 1.7–7.7)
Neutro Abs: 5.3 10*3/uL (ref 1.7–7.7)
Neutrophils Relative %: 59 %
Neutrophils Relative %: 65 %
Platelets: 321 10*3/uL (ref 150–400)
Platelets: 347 10*3/uL (ref 150–400)
RBC: 3.62 MIL/uL — ABNORMAL LOW (ref 3.87–5.11)
RBC: 3.56 MIL/uL — ABNORMAL LOW (ref 3.87–5.11)
RDW: 12 % (ref 11.5–15.5)
RDW: 11.9 % (ref 11.5–15.5)
WBC: 11.4 10*3/uL — ABNORMAL HIGH (ref 4.0–10.5)
WBC: 8.2 10*3/uL (ref 4.0–10.5)
nRBC: 0 % (ref 0.0–0.2)
nRBC: 0 % (ref 0.0–0.2)

## 2018-08-20 LAB — LIPASE, BLOOD: Lipase: 39 U/L (ref 11–51)

## 2018-08-20 LAB — BASIC METABOLIC PANEL WITH GFR
Anion gap: 10 (ref 5–15)
BUN: 16 mg/dL (ref 6–20)
CO2: 24 mmol/L (ref 22–32)
Calcium: 9.4 mg/dL (ref 8.9–10.3)
Chloride: 101 mmol/L (ref 98–111)
Creatinine, Ser: 0.81 mg/dL (ref 0.44–1.00)
GFR calc Af Amer: >60 mL/min
GFR calc non Af Amer: >60 mL/min
Glucose, Bld: 117 mg/dL — ABNORMAL HIGH (ref 70–99)
Potassium: 4.4 mmol/L (ref 3.5–5.1)
Sodium: 135 mmol/L (ref 135–145)

## 2018-08-20 LAB — COMPREHENSIVE METABOLIC PANEL WITH GFR
ALT: 14 U/L (ref 0–44)
AST: 18 U/L (ref 15–41)
Albumin: 3.9 g/dL (ref 3.5–5.0)
Alkaline Phosphatase: 50 U/L (ref 38–126)
Anion gap: 9 (ref 5–15)
BUN: 12 mg/dL (ref 6–20)
CO2: 26 mmol/L (ref 22–32)
Calcium: 9.4 mg/dL (ref 8.9–10.3)
Chloride: 101 mmol/L (ref 98–111)
Creatinine, Ser: 0.74 mg/dL (ref 0.44–1.00)
GFR calc Af Amer: >60 mL/min
GFR calc non Af Amer: >60 mL/min
Glucose, Bld: 147 mg/dL — ABNORMAL HIGH (ref 70–99)
Potassium: 3.8 mmol/L (ref 3.5–5.1)
Sodium: 136 mmol/L (ref 135–145)
Total Bilirubin: 0.4 mg/dL (ref 0.3–1.2)
Total Protein: 7 g/dL (ref 6.5–8.1)

## 2018-08-20 LAB — CBG MONITORING, ED
Glucose-Capillary: 121 mg/dL — ABNORMAL HIGH (ref 70–99)
Glucose-Capillary: 138 mg/dL — ABNORMAL HIGH (ref 70–99)

## 2018-08-20 MED ORDER — OMEPRAZOLE 20 MG PO CPDR: 20.0000 mg | DELAYED_RELEASE_CAPSULE | Freq: Every day | ORAL | 0 refills | Status: DC

## 2018-08-20 MED ORDER — ALUM & MAG HYDROXIDE-SIMETH 200-200-20 MG/5ML PO SUSP
30.0000 mL | Freq: Once | ORAL | Status: AC
  Administered 2018-08-20: 11:00:00 30 mL via ORAL
  Filled 2018-08-20: qty 30

## 2018-08-20 MED ORDER — DICYCLOMINE HCL 10 MG PO CAPS
20.0000 mg | ORAL_CAPSULE | Freq: Once | ORAL | Status: AC
  Administered 2018-08-20: 11:00:00 20 mg via ORAL
  Filled 2018-08-20: qty 2

## 2018-08-20 MED ORDER — SUCRALFATE 1 GM/10ML PO SUSP: 1.0000 g | Freq: Three times a day (TID) | ORAL | 0 refills | Status: DC

## 2018-08-20 MED ORDER — FAMOTIDINE IN NACL 20-0.9 MG/50ML-% IV SOLN
20.0000 mg | Freq: Once | INTRAVENOUS | Status: AC
  Administered 2018-08-20: 20 mg via INTRAVENOUS
  Filled 2018-08-20: qty 50

## 2018-08-20 MED ORDER — DICYCLOMINE HCL 20 MG PO TABS: 20.0000 mg | ORAL_TABLET | Freq: Three times a day (TID) | ORAL | 0 refills | Status: DC | PRN

## 2018-08-20 MED ORDER — LIDOCAINE VISCOUS HCL 2 % MT SOLN
15.0000 mL | Freq: Once | OROMUCOSAL | Status: AC
  Administered 2018-08-20: 11:00:00 15 mL via ORAL
  Filled 2018-08-20: qty 15

## 2018-08-20 NOTE — Discharge Instructions (Addendum)
You were seen in the ER today for abdominal pain Your blood work was reassuring and similar to prior blood work you have had done.    We are sending you home with the following medicines:  - Omeprazole to take daily  - Carafate- to take prior to meals & prior to bed - Bentyl- to take every 8 hours as needed for abdominal cramping.   We have prescribed you new medication(s) today. Discuss the medications prescribed today with your pharmacist as they can have adverse effects and interactions with your other medicines including over the counter and prescribed medications. Seek medical evaluation if you start to experience new or abnormal symptoms after taking one of these medicines, seek care immediately if you start to experience difficulty breathing, feeling of your throat closing, facial swelling, or rash as these could be indications of a more serious allergic reaction  Please call your primary care provider & follow up with them closely on Monday as planned. Take your medicines as prescribed. Return to the ER for new or worsening symptoms including but not limited to return of pain, worsened pain, inability to keep fluids down, blood in vomit, blood in stool, fever, or any other concerns   English to spanish google translate:  Usted fue visto hoy en la sala de emergencias por dolor abdominal Su anlisis de sangre fue tranquilizador y similar al [employer] de sangre previo que Nurse, adult.   Te enviamos a casa con los siguientes medicamentos: - Omeprazol para tomar diariamente - Carafate: para tomar antes de las comidas y antes de acostarse - Bentyl: tomar cada 8 horas segn sea necesario para los calambres abdominales.  Le hemos recetado nuevos medicamentos hoy. Discuta los medicamentos recetados hoy con su farmacutico, ya que pueden tener efectos adversos e interacciones con sus otros medicamentos, incluidos los de venta libre y los medicamentos recetados. Busque evaluacin mdica si comienza a  experimentar sntomas nuevos o anormales despus de tomar uno de estos medicamentos, busque atencin mdica de inmediato si comienza a experimentar dificultad para respirar, sensacin de cierre de garganta, hinchazn facial o erupcin cutnea, ya que estos podran ser indicios de un problema ms grave. reaccin alrgica  Llame a su proveedor de atencin primaria y haga un seguimiento cercano con ellos el lunes segn lo planeado. Tome sus medicamentos segn lo prescrito. Regrese a la sala de emergencias por sntomas nuevos o que empeoren, incluidos, entre otros, el regreso del dolor, dolor empeorado, incapacidad para [employer] lquidos, sangre en el vmito, [employer], fiebre o cualquier otra inquietud.

## 2018-08-20 NOTE — ED Notes (Signed)
Patient verbalizes understanding of discharge instructions. Opportunity for questioning and answers were provided. Armband removed by staff, pt discharged from ED ambulatory.   

## 2018-08-20 NOTE — ED Provider Notes (Signed)
[facility] EMERGENCY DEPARTMENT Provider Note   CSN: [id number] Arrival date & time: 08/20/18  3299     History   Chief Complaint Chief Complaint  Patient presents with  . Abdominal Pain    HPI Brittany Pineda is a 63 y.o. female with a hx of HTN, T2DM, & hypothyroidism who returns to the ER via EMS with complaints of abdominal pain that began just PTA. Patient recently discharged this AM after being seen for concerns regarding HTN, she states she returned home, was generally not feeling well & nauseated, ate an apple & part of a [place] sandwich with onset of generalized abdominal pain. Pain is described as burning & cramping, 5/10 in severity, no alleviating/aggravating factors. She checked her blood sugar & it was 250, she took 5 units of insulin & her daughter called 911. She reports loose stools, but this is not a new problem today.  Denies fever, chills, emesis, dysuria, chest pain, dyspnea, syncope, melena, or hematochezia.   Translator utilized throughout Sales executive.     HPI  Past Medical History:  Diagnosis Date  . Diabetes mellitus   . Hyperlipidemia   . Hypertension   . Thyroid disease     Patient Active Problem List   Diagnosis Date Noted  . Diabetic polyneuropathy associated with type 2 diabetes mellitus ([place]) 02/23/2017  . Essential hypertension 10/22/2016  . Onychomycosis 04/07/2016  . Depression 04/03/2014  . Hypothyroidism 03/13/2014  . Hyperlipidemia 07/22/2011  . Poorly controlled type 2 diabetes mellitus ([place]) 07/22/2011    Past Surgical History:  Procedure Laterality Date  . ABDOMINAL HYSTERECTOMY    . CHOLECYSTECTOMY       OB History   No obstetric history on file.      Home Medications    Prior to Admission medications   Medication Sig Start Date End Date Taking? Authorizing Provider  Blood Glucose Monitoring Suppl (AGAMATRIX PRESTO) w/Device KIT Use as instructed for PrestoWaveSense meter.  Dispense QS  for testing up to thrice daily.  Dx E11.9 04/07/16   Kinnie Feil, MD  gabapentin (NEURONTIN) 100 MG capsule Take 1 capsule (100 mg total) by mouth at bedtime. Patient not taking: Reported on 05/31/2018 03/09/17   Sela Hilding, MD  glucose blood test strip Use as instructed for PrestoWaveSense meter.  Dispense QS for testing up to thrice daily.  Dx E11.9 09/11/16   Sela Hilding, MD  insulin NPH-regular Human (70-30) 100 UNIT/ML injection 9 units in the morning and 5 units before her evening meal. Only take if you are eating full meals 08/14/18   Langston Masker B, PA-C  Lancets MISC Dispense quantity sufficient for thrice daily testing. 09/11/16   Sela Hilding, MD  levothyroxine (SYNTHROID, LEVOTHROID) 112 MCG tablet Take 1 tablet (112 mcg total) by mouth daily before breakfast. 03/09/17   Sela Hilding, MD  lisinopril (PRINIVIL,ZESTRIL) 10 MG tablet TAKE 1 TABLET BY MOUTH ONCE DAILY Patient taking differently: Take 5 mg by mouth daily.  09/08/17   Sela Hilding, MD  metFORMIN (GLUCOPHAGE) 850 MG tablet Take 1 tablet (850 mg total) by mouth 3 (three) times daily. 02/23/17   Sela Hilding, MD  metFORMIN (GLUCOPHAGE) 850 MG tablet TAKE 1 TABLET BY MOUTH THREE TIMES DAILY Patient not taking: Reported on 05/31/2018 09/28/17   Sela Hilding, MD  RELION SHORT PEN NEEDLES 31G X 8 MM MISC USE AS DIRECTED ONCE DAILY 07/09/15   Rosemarie Ax, MD  simvastatin (ZOCOR) 40 MG tablet Take 1 tablet (40 mg  total) by mouth at bedtime. 02/23/17   Sela Hilding, MD    Family History Family History  Problem Relation Age of Onset  . Alcohol abuse Father   . Hyperlipidemia Sister   . Depression Brother     Social History Social History   Tobacco Use  . Smoking status: Former Research scientist (life sciences)  . Smokeless tobacco: Never Used  . Tobacco comment: Stopped in 1996  Substance Use Topics  . Alcohol use: No    Alcohol/week: 0.0 standard drinks  . Drug use: No     Allergies    Patient has no known allergies.  Review of Systems Review of Systems  Constitutional: Negative for chills and fever.  Respiratory: Negative for shortness of breath.   Cardiovascular: Negative for chest pain.  Gastrointestinal: Positive for abdominal pain, diarrhea (no acute change) and nausea. Negative for anal bleeding, blood in stool, constipation and vomiting.  Genitourinary: Negative for dysuria, hematuria and urgency.  Neurological: Negative for syncope, weakness and numbness.  All other systems reviewed and are negative.  Physical Exam Updated Vital Signs BP 133/72 (BP Location: Right Arm)   Pulse 62   Temp 98.2 F (36.8 C) (Oral)   Resp 16   SpO2 99%   Physical Exam Vitals signs and nursing note reviewed.  Constitutional:      General: She is not in acute distress.    Appearance: She is well-developed. She is not toxic-appearing.  HENT:     Head: Normocephalic and atraumatic.  Eyes:     General:        Right eye: No discharge.        Left eye: No discharge.     Conjunctiva/sclera: Conjunctivae normal.  Neck:     Musculoskeletal: Neck supple.  Cardiovascular:     Rate and Rhythm: Normal rate and regular rhythm.  Pulmonary:     Effort: Pulmonary effort is normal. No respiratory distress.     Breath sounds: Normal breath sounds. No wheezing, rhonchi or rales.  Abdominal:     General: There is no distension.     Palpations: Abdomen is soft.     Tenderness: There is generalized abdominal tenderness. There is no guarding or rebound.  Skin:    General: Skin is warm and dry.     Findings: No rash.  Neurological:     Mental Status: She is alert.     Comments: Clear speech.   Psychiatric:        Behavior: Behavior normal.    ED Treatments / Results  Labs (all labs ordered are listed, but only abnormal results are displayed) Labs Reviewed  COMPREHENSIVE METABOLIC PANEL - Abnormal; Notable for the following components:      Result Value   Glucose, Bld 147 (*)     All other components within normal limits  CBC WITH DIFFERENTIAL/PLATELET - Abnormal; Notable for the following components:   RBC 3.56 (*)    Hemoglobin 11.6 (*)    HCT 33.6 (*)    All other components within normal limits  URINALYSIS, ROUTINE W REFLEX MICROSCOPIC - Abnormal; Notable for the following components:   Color, Urine COLORLESS (*)    Specific Gravity, Urine 1.003 (*)    All other components within normal limits  LIPASE, BLOOD    EKG None  Radiology No results found.  Procedures Procedures (including critical care time)  Medications Ordered in ED Medications  famotidine (PEPCID) IVPB 20 mg premix (0 mg Intravenous Stopped 08/20/18 1159)  alum & mag hydroxide-simeth (MAALOX/MYLANTA)  200-200-20 MG/5ML suspension 30 mL (30 mLs Oral Given 08/20/18 1032)    And  lidocaine (XYLOCAINE) 2 % viscous mouth solution 15 mL (15 mLs Oral Given 08/20/18 1032)  dicyclomine (BENTYL) capsule 20 mg (20 mg Oral Given 08/20/18 1031)     Initial Impression / Assessment and Plan / ED Course  I have reviewed the triage vital signs and the nursing notes.  Pertinent labs & imaging results that were available during my care of the patient were reviewed by me and considered in my medical decision making (see chart for details).    Patient presents to the ED with complaints of abdominal pain. Patient nontoxic appearing, in no apparent distress, vitals without significant abnormality. On exam patient had generalized tenderness, no peritoneal signs. Will evaluate with labs and . Anti-acids & bentyl ordered.   ER work-up reviewed:  CBC: No leukocytosis. Mild anemia consistent w/ prior. CMP: Mild hyperglycemia @ 147. No electrolyte derangement. LFTs & renal function WNL. No acidosis or anion gap elevation.  Lipase: WNL UA: No UTI  12:52: RE-EVAL: Pain resolved, she is feeling much better.   On repeat abdominal exam patient remains without peritoneal signs, doubt cholecystitis (s/p  cholecystectomy), pancreatitis, diverticulitis, appendicitis, or bowel obstruction/perforation. Labs not consistent w/ DKA. Patient tolerating PO in the emergency department. She appears appropriate for discharge home. She had multiple questions regarding her chronic medical problems & medicines- discussed with patient at length, she has appointment with PCP this Monday- encouraged her to follow up as scheduled. Will discharge home with supportive measures. I discussed results, treatment plan, need for PCP follow-up, and return precautions with the patient. Provided opportunity for questions, patient confirmed understanding and is in agreement with plan.   Translator line utilized throughout Sales executive.   Final Clinical Impressions(s) / ED Diagnoses   Final diagnoses:  Generalized abdominal pain    ED Discharge Orders         Ordered    omeprazole (PRILOSEC) 20 MG capsule  Daily     08/20/18 1241    sucralfate (CARAFATE) 1 GM/10ML suspension  3 times daily with meals & bedtime     08/20/18 1241    dicyclomine (BENTYL) 20 MG tablet  Every 8 hours PRN     08/20/18 1241           , Glynda Jaeger, PA-C 08/20/18 1300    Charlesetta Shanks, MD 08/21/18 [phone]

## 2018-08-20 NOTE — ED Provider Notes (Signed)
[name] EMERGENCY DEPARTMENT Provider Note   CSN: [id number] Arrival date & time: 08/20/18  2751     History   Chief Complaint Chief Complaint  Patient presents with  . Hypertension    HPI Brittany Pineda is a 63 y.o. female.     Patient presents to the emergency department with a chief complaint of elevated blood pressure.  She states that she measured her blood pressure at home and found it to be high.  He states that it has been up and down for the past couple of hours.  She states that she recently changed her blood pressure medication, and that it makes her feel poorly.  She also complains of dry mouth.  She denies any other complaints tonight.  She has an appointment with her primary  The history is provided by the patient. No language interpreter was used.    Past Medical History:  Diagnosis Date  . Diabetes mellitus   . Hyperlipidemia   . Hypertension   . Thyroid disease     Patient Active Problem List   Diagnosis Date Noted  . Diabetic polyneuropathy associated with type 2 diabetes mellitus ([place]) 02/23/2017  . Essential hypertension 10/22/2016  . Onychomycosis 04/07/2016  . Depression 04/03/2014  . Hypothyroidism 03/13/2014  . Hyperlipidemia 07/22/2011  . Poorly controlled type 2 diabetes mellitus ([place]) 07/22/2011    Past Surgical History:  Procedure Laterality Date  . ABDOMINAL HYSTERECTOMY    . CHOLECYSTECTOMY       OB History   No obstetric history on file.      Home Medications    Prior to Admission medications   Medication Sig Start Date End Date Taking? Authorizing Provider  Blood Glucose Monitoring Suppl (AGAMATRIX PRESTO) w/Device KIT Use as instructed for PrestoWaveSense meter.  Dispense QS for testing up to thrice daily.  Dx E11.9 04/07/16   Kinnie Feil, MD  gabapentin (NEURONTIN) 100 MG capsule Take 1 capsule (100 mg total) by mouth at bedtime. Patient not taking: Reported on 05/31/2018 03/09/17    Sela Hilding, MD  glucose blood test strip Use as instructed for PrestoWaveSense meter.  Dispense QS for testing up to thrice daily.  Dx E11.9 09/11/16   Sela Hilding, MD  insulin NPH-regular Human (70-30) 100 UNIT/ML injection 9 units in the morning and 5 units before her evening meal. Only take if you are eating full meals 08/14/18   Langston Masker B, PA-C  Lancets MISC Dispense quantity sufficient for thrice daily testing. 09/11/16   Sela Hilding, MD  levothyroxine (SYNTHROID, LEVOTHROID) 112 MCG tablet Take 1 tablet (112 mcg total) by mouth daily before breakfast. 03/09/17   Sela Hilding, MD  lisinopril (PRINIVIL,ZESTRIL) 10 MG tablet TAKE 1 TABLET BY MOUTH ONCE DAILY Patient taking differently: Take 5 mg by mouth daily.  09/08/17   Sela Hilding, MD  metFORMIN (GLUCOPHAGE) 850 MG tablet Take 1 tablet (850 mg total) by mouth 3 (three) times daily. 02/23/17   Sela Hilding, MD  metFORMIN (GLUCOPHAGE) 850 MG tablet TAKE 1 TABLET BY MOUTH THREE TIMES DAILY Patient not taking: Reported on 05/31/2018 09/28/17   Sela Hilding, MD  RELION SHORT PEN NEEDLES 31G X 8 MM MISC USE AS DIRECTED ONCE DAILY 07/09/15   Rosemarie Ax, MD  simvastatin (ZOCOR) 40 MG tablet Take 1 tablet (40 mg total) by mouth at bedtime. 02/23/17   Sela Hilding, MD    Family History Family History  Problem Relation Age of Onset  . Alcohol  abuse Father   . Hyperlipidemia Sister   . Depression Brother     Social History Social History   Tobacco Use  . Smoking status: Former Research scientist (life sciences)  . Smokeless tobacco: Never Used  . Tobacco comment: Stopped in 1996  Substance Use Topics  . Alcohol use: No    Alcohol/week: 0.0 standard drinks  . Drug use: No     Allergies   Patient has no known allergies.   Review of Systems Review of Systems  All other systems reviewed and are negative.    Physical Exam Updated Vital Signs BP 129/76 (BP Location: Right Arm)   Pulse 69    Temp 98.5 F (36.9 C) (Oral)   Resp 18   SpO2 99%   Physical Exam Vitals signs and nursing note reviewed.  Constitutional:      General: She is not in acute distress.    Appearance: She is well-developed.  HENT:     Head: Normocephalic and atraumatic.  Eyes:     Conjunctiva/sclera: Conjunctivae normal.  Neck:     Musculoskeletal: Neck supple.  Cardiovascular:     Rate and Rhythm: Normal rate and regular rhythm.     Heart sounds: No murmur.  Pulmonary:     Effort: Pulmonary effort is normal. No respiratory distress.     Breath sounds: Normal breath sounds.  Abdominal:     Palpations: Abdomen is soft.     Tenderness: There is no abdominal tenderness.  Skin:    General: Skin is warm and dry.  Neurological:     Mental Status: She is alert and oriented to person, place, and time.  Psychiatric:        Mood and Affect: Mood normal.        Behavior: Behavior normal.        Thought Content: Thought content normal.      ED Treatments / Results  Labs (all labs ordered are listed, but only abnormal results are displayed) Labs Reviewed  CBC WITH DIFFERENTIAL/PLATELET - Abnormal; Notable for the following components:      Result Value   WBC 11.4 (*)    RBC 3.62 (*)    Hemoglobin 11.7 (*)    HCT 35.1 (*)    Basophils Absolute 0.2 (*)    All other components within normal limits  BASIC METABOLIC PANEL - Abnormal; Notable for the following components:   Glucose, Bld 117 (*)    All other components within normal limits  CBG MONITORING, ED - Abnormal; Notable for the following components:   Glucose-Capillary 121 (*)    All other components within normal limits    EKG EKG Interpretation  Date/Time:  Saturday August 20 2018 04:19:49 EDT Ventricular Rate:  68 PR Interval:    QRS Duration: 82 QT Interval:  391 QTC Calculation: 416 R Axis:   2 Text Interpretation:  No significant change was found Confirmed by Ezequiel Essex [phone]) on 08/20/2018 4:43:13 AM   Radiology No  results found.  Procedures Procedures (including critical care time)  Medications Ordered in ED Medications - No data to display   Initial Impression / Assessment and Plan / ED Course  I have reviewed the triage vital signs and the nursing notes.  Pertinent labs & imaging results that were available during my care of the patient were reviewed by me and considered in my medical decision making (see chart for details).        Patient with elevated blood pressure reading at home.  Blood pressures  have normalized here in the emergency department.  Her laboratory work-up is reassuring.  She is stress.  I have urged her to follow-up with her primary care doctor.  She has an appointment on Monday.  It sounds like she recently changed her blood pressure medications, denies side effects that caused her to not feel very well.  I have discussed with her that she may need to change, but will defer this to her primary doctor.  Final Clinical Impressions(s) / ED Diagnoses   Final diagnoses:  Elevated blood pressure reading    ED Discharge Orders    None       Montine Circle, PA-C 08/20/18 0458    Ezequiel Essex, MD 08/20/18 [phone]

## 2018-08-20 NOTE — Discharge Instructions (Addendum)
Your blood pressure readings are good in the emergency department.  You may need to have your home blood pressure cuff calibrated.  Your labs look good.  Please discuss your symptoms with your doctor.  You may need to have your medication adjusted if it makes you feel poorly.  Sus lecturas de presin arterial son buenas en el departamento de Multimedia programmer. Es posible que necesite calibrar el manguito de presin arterial de su hogar. Tus laboratorios se ven bien. Por favor discuta sus sntomas con su mdico. Es posible que necesite ajustar su medicamento si lo hace sentir mal.

## 2018-08-20 NOTE — ED Triage Notes (Signed)
Pt arrives to the ED with [facility] after being d/c'd this morning ~ 0830. Pt reports being hypertensive and having abdominal pain that is a "burning sensation"; pt states pain was not present earlier this morning.  EMS vitals:  98% O2 RA BP 120/70 CBG 250; pt took 5 units of insulin this AM, CBG now 151  97.4 temp

## 2018-08-20 NOTE — ED Triage Notes (Signed)
Pt reports her blood pressure has been high for 2 hours. Pt reports dry mouth. Pt has hx of HTN, takes atenolol BID. Pt denies chest pain.

## 2018-08-22 ENCOUNTER — Ambulatory Visit (INDEPENDENT_AMBULATORY_CARE_PROVIDER_SITE_OTHER): Payer: Self-pay | Admitting: Family Medicine

## 2018-08-22 ENCOUNTER — Encounter: Payer: Self-pay | Admitting: Family Medicine

## 2018-08-22 ENCOUNTER — Other Ambulatory Visit: Payer: Self-pay

## 2018-08-22 VITALS — BP 114/62 | HR 64

## 2018-08-22 DIAGNOSIS — I10 Essential (primary) hypertension: Secondary | ICD-10-CM

## 2018-08-22 DIAGNOSIS — E782 Mixed hyperlipidemia: Secondary | ICD-10-CM

## 2018-08-22 DIAGNOSIS — E1165 Type 2 diabetes mellitus with hyperglycemia: Secondary | ICD-10-CM

## 2018-08-22 DIAGNOSIS — E039 Hypothyroidism, unspecified: Secondary | ICD-10-CM

## 2018-08-22 DIAGNOSIS — F419 Anxiety disorder, unspecified: Secondary | ICD-10-CM

## 2018-08-22 LAB — POCT GLYCOSYLATED HEMOGLOBIN (HGB A1C): HbA1c, POC (controlled diabetic range): 6 % (ref 0.0–7.0)

## 2018-08-22 MED ORDER — METFORMIN HCL ER 500 MG PO TB24: 1000.0000 mg | ORAL_TABLET | Freq: Two times a day (BID) | ORAL | 3 refills | Status: DC

## 2018-08-22 MED ORDER — AMLODIPINE BESYLATE 10 MG PO TABS: 10.0000 mg | ORAL_TABLET | Freq: Every day | ORAL | 3 refills | Status: DC

## 2018-08-22 NOTE — Patient Instructions (Addendum)
It was great to meet you today,  For your blood pressure: I would like you to stop taking your blood pressure at home, because when you see these high numbers it makes her blood pressure worse.  It is okay to have high blood pressure at certain parts throughout the day, will we are concerned with his your overall blood pressure, which has been good every time we have measured it.  I am also going to start you on a medicine called amlodipine which has less side effects than the atenolol, and I would like you to stop taking the atenolol immediately.  For your diabetes: I would like you to stop taking your insulin altogether until I see you again in 1 month.  I am going to change your metformin to twice a day instead of 3 times a day, and change the dosage to 1000 mg from 850 mg.  This new type of metformin will have less side effects and will hopefully make you less nauseous.  Please continue to measure your blood sugar once a day during this time, but do not take insulin based on what numbers you see.  For your thyroid: We are going to test her thyroid function today, and I will call you if we decide to change the amount of thyroid medicine you are taking based on these labs.  For your cholesterol: Please continue to take the cholesterol medication as it is prescribed, I will tell you the results of the cholesterol tests when I get them.  Overall you are doing very well, but I would like to check on you again in 1 month because we have changed a lot of medications for you.  Have a great day,  Clemetine Marker, MD

## 2018-08-22 NOTE — Progress Notes (Signed)
[facility] Phone: [phone]   cc: Hypertension, diabetes, medication management  Subjective:  Hypertension: Patient states she was recently changed from lisinopril to atenolol for her blood pressure.  She says that since she is started taking the atenolol she has noticed symptoms such as nausea, feelings of her heart racing, which she will then check her blood pressure and she states it is very high, meaning over 160 systolic.  She then will take her atenolol and often call EMS or go to the emergency department.  She recently started taking her blood pressure at home with a home blood pressure meter she got from the pharmacy.  She will take her blood pressure whenever she starts to feel the symptoms of headache or heart racing.  The atenolol is 25 mg twice daily, prescribed by Dr. Kathrynn RunningPhilip karam.  Diabetes: Patient was taking metformin 850 mg 3 times a day prescribed by a Dr. Laurine BlazerAlfonso Faison.  She also has been taking insulin, most recently 7030 mix prescribed alyssa murray..  Last week the patient complained of multiple hypoglycemic episodes and readings on her glucometer.  Lowest 1 was 27 at night.  After I spoke with the patient on the phone last week and told her to stop taking her insulin she has since only taken her insulin once or twice when she felt like her blood sugar was too high.  She keeps a record of her blood sugar readings and these are often below 200, and sometimes below 100 even after stopping the insulin use.  Since stopping the insulin last week she has not had any recurrence of hypoglycemic symptoms.  The patient does experience a constant nausea sensation, but she does not have any vomiting.  She believes this is caused by the atenolol.  Hypothyroidism: Patient is prescribed 50 mcg of Synthroid was recently filled by a Dr. Laurine BlazerAlfonso Faison.  Prior to that she was on 100 mcg.  She is uncertain why she was switched to a lower dose.  Diabetes: has stopped  using her insulin. 70/30 given by Aviva Kluveralyssa murray.  From may to today she lost 15 lbs.  taks metformin 850mg  TID alphonza Faison. Fear of incrasing my blood sugar.   Patient recently had a burning sensation on her stomach and back.  She does not have this symptom before, and is not having it currently.  She complained of this while she was in the ED during her last visit and it appears she was prescribed omeprazole for the symptoms.  She has been taking omeprazole 20 mg for the past 2 days.  Hyperlipidemia: The patient would like to have her tested.  She takes her simvastatin 20 mg a day with dinner.  ROS: See HPI for pertinent positives and negatives  Past Medical History  Family history reviewed for today's visit. No changes.  Social history- patient is a non-smoker  Objective: BP 114/62    Pulse 64    SpO2 98%  Gen: NAD, alert and oriented, cooperative with exam Neck: no masses CV: normal rate, regular rhythm. No murmurs, no rubs.  Resp: LCTAB, no wheezes, crackles. normal work of breathing GI: nontender to palpation, BS present, no guarding or organomegaly Psych: Patient appears anxious and often perseverates on her blood pressure.  Patient has poor eye contact.  Assessment/Plan: Essential hypertension Although the patient has been a patient of the family medicine clinic for the last few years, all of the medicine she has brought with her have been filled by  people other than members of our staff.  She was switched by a Dr. Deatra Robinson from lisinopril to atenolol.  Since taking the atenolol she has become more concerned about her blood pressure and has been monitoring at home whenever she "feels bad" and she states she gets elevated readings in the 160s to 180s or higher she will then take her atenolol or call EMS and come to the emergency department.  In all of her recorded values in epic her blood pressure has been below 140.  I advised the patient to stop taking her blood pressure at  home, as these machines are often inaccurate, and is leading to increased anxiety which can exacerbate her high blood pressure.  Told her that occasional blood pressure readings of 160s or higher is perfectly okay as long as overall her blood pressure is normal.  Informed her that these blood pressure medicines are not to be taken as needed, and should be taken same time every day.  I am not currently concerned with these elevated readings at home as she is never once had a blood pressure reading recorded in epic above 140.  Doubtful that this is something like pheochromocytoma, more likely an inaccurate blood pressure reading which makes her more anxious and increases her blood pressure and symptoms of anxiety.   -Prescribed the patient amlodipine 10 mg - Discontinued atenolol -Advised patient to follow-up in 1 month  Poorly controlled type 2 diabetes mellitus Patiently currently prescribed metformin 850 mg 3 times daily as well as insulin 70/30 mix twice a day.  Had previously advised patient to stop her insulin as it appears she does not needed and often causes her to go into hypoglycemic episodes.  Hypoglycemia has resolved since stopping insulin, but patient's recorded blood sugar levels are still in the 100-200 range with just metformin.  The patient's A1c during this office visit was 6.0 previous values were above 8.  This is consistent with my thoughts that her blood sugar had been too tightly controlled on her current regiment.  Again these medications were not prescribed by [place], metformin was prescribed by this Dr. Deatra Robinson.  Given that she is taking metformin 3 times a day it is likely her nausea is related to this.  On her next follow-up we will recheck her blood sugar levels to see if they maintained normal values while off insulin. - Change metformin from current dose to extended release 1000 mg twice daily. - Told patient to continue taking her blood sugar readings once a  day.  Hyperlipidemia Patient asked to have her cholesterol checked.  She is on simvastatin.  Last lipid panel in 2018 showed an LDL under 100. - Lipid panel -Continue simvastatin  Hypothyroidism Patient was recently switched from 112 micrograms of Synthroid prescribed by [place] to 50 mcg prescribed by this Dr. Lynann Beaver.  Patient's TSH was previously 0.026 in January 2019, and her reading after being switched to 50 mcg was 8.139 41-month ago.  I ordered a repeat TSH to get the most accurate reading, before I change her medication.  We will likely increase it to 75 mcg if it comes back elevated again. -Follow-up TSH  Anxiety I believe the patient's current blood pressure issues and symptoms of increased heart rate, feeling agitated and "not well" are related to anxiety.  Uncertain if she has panic attacks or generalized anxiety disorder, but she does have a history of depression for which she is not taking any medications.  She might benefit  from a SSRI such as citalopram that will treat both her anxiety and depression.  I will discuss this further with her on follow-up visits. - Have patient fill out gad 7 and PHQ 9 on next visit.    Frederic Jerichoan Azure Barrales, MD PGY-2

## 2018-08-23 DIAGNOSIS — F419 Anxiety disorder, unspecified: Secondary | ICD-10-CM | POA: Insufficient documentation

## 2018-08-23 NOTE — Assessment & Plan Note (Signed)
Patient was recently switched from 112 micrograms of Synthroid prescribed by [place] to 50 mcg prescribed by this Dr. Lynann Beaver.  Patient's TSH was previously 0.026 in January 2019, and her reading after being switched to 50 mcg was 8.139 55-month ago.  I ordered a repeat TSH to get the most accurate reading, before I change her medication.  We will likely increase it to 75 mcg if it comes back elevated again. -Follow-up TSH

## 2018-08-23 NOTE — Assessment & Plan Note (Signed)
I believe the patient's current blood pressure issues and symptoms of increased heart rate, feeling agitated and "not well" are related to anxiety.  Uncertain if she has panic attacks or generalized anxiety disorder, but she does have a history of depression for which she is not taking any medications.  She might benefit from a SSRI such as citalopram that will treat both her anxiety and depression.  I will discuss this further with her on follow-up visits. - Have patient fill out gad 7 and PHQ 9 on next visit.

## 2018-08-23 NOTE — Assessment & Plan Note (Signed)
Although the patient has been a patient of the family medicine clinic for the last few years, all of the medicine she has brought with her have been filled by people other than members of our staff.  She was switched by a Dr. Deatra Robinson from lisinopril to atenolol.  Since taking the atenolol she has become more concerned about her blood pressure and has been monitoring at home whenever she "feels bad" and she states she gets elevated readings in the 160s to 180s or higher she will then take her atenolol or call EMS and come to the emergency department.  In all of her recorded values in epic her blood pressure has been below 140.  I advised the patient to stop taking her blood pressure at home, as these machines are often inaccurate, and is leading to increased anxiety which can exacerbate her high blood pressure.  Told her that occasional blood pressure readings of 160s or higher is perfectly okay as long as overall her blood pressure is normal.  Informed her that these blood pressure medicines are not to be taken as needed, and should be taken same time every day.  I am not currently concerned with these elevated readings at home as she is never once had a blood pressure reading recorded in epic above 140.  Doubtful that this is something like pheochromocytoma, more likely an inaccurate blood pressure reading which makes her more anxious and increases her blood pressure and symptoms of anxiety.   -Prescribed the patient amlodipine 10 mg - Discontinued atenolol -Advised patient to follow-up in 1 month

## 2018-08-23 NOTE — Assessment & Plan Note (Signed)
Patiently currently prescribed metformin 850 mg 3 times daily as well as insulin 70/30 mix twice a day.  Had previously advised patient to stop her insulin as it appears she does not needed and often causes her to go into hypoglycemic episodes.  Hypoglycemia has resolved since stopping insulin, but patient's recorded blood sugar levels are still in the 100-200 range with just metformin.  The patient's A1c during this office visit was 6.0 previous values were above 8.  This is consistent with my thoughts that her blood sugar had been too tightly controlled on her current regiment.  Again these medications were not prescribed by [place], metformin was prescribed by this Dr. Deatra Robinson.  Given that she is taking metformin 3 times a day it is likely her nausea is related to this.  On her next follow-up we will recheck her blood sugar levels to see if they maintained normal values while off insulin. - Change metformin from current dose to extended release 1000 mg twice daily. - Told patient to continue taking her blood sugar readings once a day.

## 2018-08-23 NOTE — Assessment & Plan Note (Signed)
>>  ASSESSMENT AND PLAN FOR ANXIETY WRITTEN ON 08/23/2018 10:19 AM BY Sandre Kitty, MD  I believe the patient's current blood pressure issues and symptoms of increased heart rate, feeling agitated and "not well" are related to anxiety.  Uncertain if she has panic attacks or generalized anxiety disorder, but she does have a history of depression for which she is not taking any medications.  She might benefit from a SSRI such as citalopram that will treat both her anxiety and depression.  I will discuss this further with her on follow-up visits. - Have patient fill out gad 7 and PHQ 9 on next visit.

## 2018-08-23 NOTE — Assessment & Plan Note (Signed)
Patient asked to have her cholesterol checked.  She is on simvastatin.  Last lipid panel in 2018 showed an LDL under 100. - Lipid panel -Continue simvastatin

## 2018-08-24 LAB — LIPID PANEL
Chol/HDL Ratio: 3.5 ratio (ref 0.0–4.4)
Cholesterol, Total: 139 mg/dL (ref 100–199)
HDL: 40 mg/dL (ref >39)
LDL Calculated: 73 mg/dL (ref 0–99)
Triglycerides: 132 mg/dL (ref 0–149)
VLDL Cholesterol Cal: 26 mg/dL (ref 5–40)

## 2018-08-24 LAB — TSH: TSH: 3.72 u[IU]/mL (ref 0.450–4.500)

## 2018-08-29 ENCOUNTER — Encounter (HOSPITAL_COMMUNITY): Payer: Self-pay | Admitting: Emergency Medicine

## 2018-08-29 ENCOUNTER — Emergency Department (HOSPITAL_COMMUNITY): Payer: Self-pay

## 2018-08-29 ENCOUNTER — Emergency Department (HOSPITAL_COMMUNITY)
Admission: EM | Admit: 2018-08-29 | Discharge: 2018-08-29 | Disposition: A | Discharge status: Home / Self Care | Payer: Self-pay | Attending: Emergency Medicine | Admitting: Emergency Medicine

## 2018-08-29 ENCOUNTER — Other Ambulatory Visit: Payer: Self-pay

## 2018-08-29 DIAGNOSIS — Z87891 Personal history of nicotine dependence: Secondary | ICD-10-CM | POA: Insufficient documentation

## 2018-08-29 DIAGNOSIS — Z794 Long term (current) use of insulin: Secondary | ICD-10-CM | POA: Insufficient documentation

## 2018-08-29 DIAGNOSIS — E039 Hypothyroidism, unspecified: Secondary | ICD-10-CM | POA: Insufficient documentation

## 2018-08-29 DIAGNOSIS — I10 Essential (primary) hypertension: Secondary | ICD-10-CM | POA: Insufficient documentation

## 2018-08-29 DIAGNOSIS — E162 Hypoglycemia, unspecified: Secondary | ICD-10-CM | POA: Insufficient documentation

## 2018-08-29 DIAGNOSIS — R079 Chest pain, unspecified: Secondary | ICD-10-CM | POA: Insufficient documentation

## 2018-08-29 DIAGNOSIS — Z79899 Other long term (current) drug therapy: Secondary | ICD-10-CM | POA: Insufficient documentation

## 2018-08-29 DIAGNOSIS — R42 Dizziness and giddiness: Secondary | ICD-10-CM | POA: Insufficient documentation

## 2018-08-29 DIAGNOSIS — M79642 Pain in left hand: Secondary | ICD-10-CM | POA: Insufficient documentation

## 2018-08-29 DIAGNOSIS — E1140 Type 2 diabetes mellitus with diabetic neuropathy, unspecified: Secondary | ICD-10-CM | POA: Insufficient documentation

## 2018-08-29 LAB — BASIC METABOLIC PANEL
Anion gap: 11 (ref 5–15)
BUN: 12 mg/dL (ref 6–20)
CO2: 23 mmol/L (ref 22–32)
Calcium: 9.5 mg/dL (ref 8.9–10.3)
Chloride: 99 mmol/L (ref 98–111)
Creatinine, Ser: 0.7 mg/dL (ref 0.44–1.00)
GFR calc Af Amer: >60 mL/min (ref >60)
GFR calc non Af Amer: >60 mL/min (ref >60)
Glucose, Bld: 164 mg/dL — ABNORMAL HIGH (ref 70–99)
Potassium: 4 mmol/L (ref 3.5–5.1)
Sodium: 133 mmol/L — ABNORMAL LOW (ref 135–145)

## 2018-08-29 LAB — CBC
HCT: 34.8 % — ABNORMAL LOW (ref 36.0–46.0)
Hemoglobin: 11.7 g/dL — ABNORMAL LOW (ref 12.0–15.0)
MCH: 32.3 pg (ref 26.0–34.0)
MCHC: 33.6 g/dL (ref 30.0–36.0)
MCV: 96.1 fL (ref 80.0–100.0)
Platelets: 382 10*3/uL (ref 150–400)
RBC: 3.62 MIL/uL — ABNORMAL LOW (ref 3.87–5.11)
RDW: 11.9 % (ref 11.5–15.5)
WBC: 7.9 10*3/uL (ref 4.0–10.5)
nRBC: 0 % (ref 0.0–0.2)

## 2018-08-29 LAB — URINALYSIS, ROUTINE W REFLEX MICROSCOPIC
Bilirubin Urine: NEGATIVE
Glucose, UA: NEGATIVE mg/dL
Hgb urine dipstick: NEGATIVE
Ketones, ur: NEGATIVE mg/dL
Nitrite: NEGATIVE
Protein, ur: NEGATIVE mg/dL
Specific Gravity, Urine: 1.004 — ABNORMAL LOW (ref 1.005–1.030)
pH: 6 (ref 5.0–8.0)

## 2018-08-29 LAB — HEPATIC FUNCTION PANEL
ALT: 14 U/L (ref 0–44)
AST: 16 U/L (ref 15–41)
Albumin: 4.2 g/dL (ref 3.5–5.0)
Alkaline Phosphatase: 55 U/L (ref 38–126)
Bilirubin, Direct: 0.1 mg/dL (ref 0.0–0.2)
Indirect Bilirubin: 0.5 mg/dL (ref 0.3–0.9)
Total Bilirubin: 0.6 mg/dL (ref 0.3–1.2)
Total Protein: 7.4 g/dL (ref 6.5–8.1)

## 2018-08-29 LAB — I-STAT BETA HCG BLOOD, ED (MC, WL, AP ONLY): I-stat hCG, quantitative: <5 m[IU]/mL (ref <5)

## 2018-08-29 LAB — TROPONIN I (HIGH SENSITIVITY)
Troponin I (High Sensitivity): <2 ng/L (ref <18)
Troponin I (High Sensitivity): <2 ng/L (ref <18)

## 2018-08-29 LAB — LIPASE, BLOOD: Lipase: 35 U/L (ref 11–51)

## 2018-08-29 MED ORDER — SODIUM CHLORIDE 0.9 % IV BOLUS
1000.0000 mL | Freq: Once | INTRAVENOUS | Status: AC
  Administered 2018-08-29: 1000 mL via INTRAVENOUS

## 2018-08-29 MED ORDER — ASPIRIN 81 MG PO CHEW
324.0000 mg | CHEWABLE_TABLET | Freq: Once | ORAL | Status: AC
  Administered 2018-08-29: 324 mg via ORAL
  Filled 2018-08-29: qty 4

## 2018-08-29 NOTE — ED Provider Notes (Signed)
[facility] EMERGENCY DEPARTMENT Provider Note   CSN: [id number] Arrival date & time: 08/29/18  0410    History   Chief Complaint Chief Complaint  Patient presents with  . Hyperglycemia    HPI Brittany Pineda is a 63 y.o. female.     HPI  63 year old female presents with a chief complaint of hyperglycemia and hypoglycemia.  History is taken with the Spanish interpreter.  The patient states that over the last 24-48 hours she has felt poorly and noticed her glucose going up and down.  At one point went into the 240s.  She also will feel like it gets very low when she will feel dizzy/lightheaded.  She wonders if her blood pressure is fluctuating as well.  Last felt this way in the middle of the night.  Currently she does not feel dizzy.  She did recently have some chest pain that she felt like was on the skin in between her inferior chest and upper abdomen.  She states it felt like a burning and she had some in her left hand as well.  No shortness of breath.  She denies any headache.  She is most concerned about the fluctuating glucose. No chest pain now.  Past Medical History:  Diagnosis Date  . Diabetes mellitus   . Hyperlipidemia   . Hypertension   . Thyroid disease     Patient Active Problem List   Diagnosis Date Noted  . Anxiety 08/23/2018  . Diabetic polyneuropathy associated with type 2 diabetes mellitus ([place]) 02/23/2017  . Essential hypertension 10/22/2016  . Onychomycosis 04/07/2016  . Depression 04/03/2014  . Hypothyroidism 03/13/2014  . Hyperlipidemia 07/22/2011  . Poorly controlled type 2 diabetes mellitus ([place]) 07/22/2011    Past Surgical History:  Procedure Laterality Date  . ABDOMINAL HYSTERECTOMY    . CHOLECYSTECTOMY       OB History   No obstetric history on file.      Home Medications    Prior to Admission medications   Medication Sig Start Date End Date Taking? Authorizing Provider  amLODipine (NORVASC) 10 MG  tablet Take 1 tablet (10 mg total) by mouth at bedtime. 08/22/18  Yes Benay Pike, MD  levothyroxine (SYNTHROID, LEVOTHROID) 112 MCG tablet Take 1 tablet (112 mcg total) by mouth daily before breakfast. Patient taking differently: Take 50 mcg by mouth daily before breakfast.  03/09/17  Yes Sela Hilding, MD  metFORMIN (GLUCOPHAGE) 850 MG tablet Take 850 mg by mouth 2 (two) times daily with a meal.    Yes [provider]  omeprazole (PRILOSEC) 20 MG capsule Take 1 capsule (20 mg total) by mouth daily. 08/20/18  Yes Petrucelli, Samantha R, PA-C  simvastatin (ZOCOR) 40 MG tablet Take 1 tablet (40 mg total) by mouth at bedtime. 02/23/17  Yes Sela Hilding, MD  Blood Glucose Monitoring Suppl (AGAMATRIX PRESTO) w/Device KIT Use as instructed for PrestoWaveSense meter.  Dispense QS for testing up to thrice daily.  Dx E11.9 04/07/16   Kinnie Feil, MD  dicyclomine (BENTYL) 20 MG tablet Take 1 tablet (20 mg total) by mouth every 8 (eight) hours as needed for spasms (abdominal cramps). Patient not taking: Reported on 08/29/2018 08/20/18   Petrucelli, Samantha R, PA-C  gabapentin (NEURONTIN) 100 MG capsule Take 1 capsule (100 mg total) by mouth at bedtime. Patient not taking: Reported on 05/31/2018 03/09/17   Sela Hilding, MD  glucose blood test strip Use as instructed for PrestoWaveSense meter.  Dispense QS for testing up  to thrice daily.  Dx E11.9 09/11/16   Sela Hilding, MD  insulin NPH-regular Human (70-30) 100 UNIT/ML injection 9 units in the morning and 5 units before her evening meal. Only take if you are eating full meals Patient not taking: Reported on 08/29/2018 08/14/18   Langston Masker B, PA-C  Lancets MISC Dispense quantity sufficient for thrice daily testing. 09/11/16   Sela Hilding, MD  metFORMIN (GLUCOPHAGE-XR) 500 MG 24 hr tablet Take 2 tablets (1,000 mg total) by mouth 2 (two) times a day. 08/22/18   Benay Pike, MD  RELION SHORT PEN NEEDLES 31G X 8 MM  MISC USE AS DIRECTED ONCE DAILY 07/09/15   Rosemarie Ax, MD  sucralfate (CARAFATE) 1 GM/10ML suspension Take 10 mLs (1 g total) by mouth 4 (four) times daily -  with meals and at bedtime. Patient not taking: Reported on 08/29/2018 08/20/18   Petrucelli, Glynda Jaeger, PA-C    Family History Family History  Problem Relation Age of Onset  . Alcohol abuse Father   . Hyperlipidemia Sister   . Depression Brother     Social History Social History   Tobacco Use  . Smoking status: Former Research scientist (life sciences)  . Smokeless tobacco: Never Used  . Tobacco comment: Stopped in 1996  Substance Use Topics  . Alcohol use: No    Alcohol/week: 0.0 standard drinks  . Drug use: No     Allergies   Patient has no known allergies.   Review of Systems Review of Systems  Respiratory: Negative for shortness of breath.   Cardiovascular: Positive for chest pain.  Gastrointestinal: Negative for abdominal pain.  Neurological: Positive for light-headedness. Negative for headaches.  All other systems reviewed and are negative.    Physical Exam Updated Vital Signs BP 112/64   Pulse 80   Temp 97.8 F (36.6 C) (Oral)   Resp 15   SpO2 100%   Physical Exam Vitals signs and nursing note reviewed.  Constitutional:      General: She is not in acute distress.    Appearance: She is well-developed. She is not ill-appearing or diaphoretic.  HENT:     Head: Normocephalic and atraumatic.     Right Ear: External ear normal.     Left Ear: External ear normal.     Nose: Nose normal.  Eyes:     General:        Right eye: No discharge.        Left eye: No discharge.  Cardiovascular:     Rate and Rhythm: Normal rate and regular rhythm.     Heart sounds: Normal heart sounds.  Pulmonary:     Effort: Pulmonary effort is normal.     Breath sounds: Normal breath sounds.  Abdominal:     Palpations: Abdomen is soft.     Tenderness: There is abdominal tenderness (mild) in the epigastric area.  Skin:    General: Skin  is warm and dry.  Neurological:     Mental Status: She is alert.  Psychiatric:        Mood and Affect: Mood is not anxious.      ED Treatments / Results  Labs (all labs ordered are listed, but only abnormal results are displayed) Labs Reviewed  BASIC METABOLIC PANEL - Abnormal; Notable for the following components:      Result Value   Sodium 133 (*)    Glucose, Bld 164 (*)    All other components within normal limits  CBC - Abnormal; Notable for  the following components:   RBC 3.62 (*)    Hemoglobin 11.7 (*)    HCT 34.8 (*)    All other components within normal limits  URINALYSIS, ROUTINE W REFLEX MICROSCOPIC - Abnormal; Notable for the following components:   Color, Urine STRAW (*)    Specific Gravity, Urine 1.004 (*)    Leukocytes,Ua SMALL (*)    Bacteria, UA RARE (*)    All other components within normal limits  HEPATIC FUNCTION PANEL  LIPASE, BLOOD  CBG MONITORING, ED  I-STAT BETA HCG BLOOD, ED ([facility], [facility], [facility] ONLY)  TROPONIN I (HIGH SENSITIVITY)  TROPONIN I (HIGH SENSITIVITY)    EKG EKG Interpretation  Date/Time:  Monday August 29 2018 08:06:05 EDT Ventricular Rate:  76 PR Interval:    QRS Duration: 76 QT Interval:  372 QTC Calculation: 419 R Axis:   -12 Text Interpretation:  Sinus rhythm no acute ST/T changes no significant change since August 20 2018 Confirmed by Sherwood Gambler [phone]) on 08/29/2018 8:20:45 AM   Radiology Dg Chest Portable 1 View  Result Date: 08/29/2018 CLINICAL DATA:  Chest pain EXAM: PORTABLE CHEST 1 VIEW COMPARISON:  08/14/2018; 07/22/2018 FINDINGS: Grossly unchanged cardiac silhouette and mediastinal contours given kyphotic projection. No discrete focal airspace opacities. No pleural effusion or pneumothorax. No evidence of edema. No acute osseous abnormalities. IMPRESSION: No acute cardiopulmonary disease on this AP portable examination. Electronically Signed   By: Sandi Mariscal M.D.   On: 08/29/2018 07:47    Procedures Procedures  (including critical care time)  Medications Ordered in ED Medications  sodium chloride 0.9 % bolus 1,000 mL (0 mLs Intravenous Stopped 08/29/18 0940)  aspirin chewable tablet 324 mg (324 mg Oral Given 08/29/18 0758)     Initial Impression / Assessment and Plan / ED Course  I have reviewed the triage vital signs and the nursing notes.  Pertinent labs & imaging results that were available during my care of the patient were reviewed by me and considered in my medical decision making (see chart for details).        Work-up is fairly unremarkable.  Glucose is only 164.  With her fluctuations, I think it is reasonable for her to follow-up with PCP for outpatient glucose control and medication adjustment.  Vitals are stable.  She endorses some vague upper abdominal/lower chest pain that given she is a diabetic I have ordered troponins and these are negative.  ECG without acute ischemic changes.  I doubt this is ACS, PE, dissection.  Doubt acute intra-abdominal process.  Advised increase fluids and follow-up with PCP.  Return precautions.  Final Clinical Impressions(s) / ED Diagnoses   Final diagnoses:  Dizziness  Nonspecific chest pain    ED Discharge Orders    None       Sherwood Gambler, MD 08/29/18 1140

## 2018-08-29 NOTE — ED Notes (Signed)
Pt given cheese, crackers and Diet Sprite, per Selinda Eon - RN.

## 2018-08-29 NOTE — Discharge Instructions (Addendum)
If you develop recurrent, continued, or worsening chest pain, shortness of breath, fever, vomiting, abdominal or back pain, or any other new/concerning symptoms then return to the ER for evaluation.   Call your doctor for an appointment next week to help adjust your glucose and blood pressure medicines as needed.  Si desarrolla dolor torcico recurrente, continuo o que [place], falta de [place], [place], vmitos, dolor abdominal o de espalda, o cualquier otro sntoma nuevo / preocupante, regrese a la sala de emergencias para su evaluacin.  Llame a su mdico para programar una cita la prxima semana para ayudarlo a ajustar sus medicamentos para la glucosa y la presin arterial segn sea necesario.

## 2018-08-29 NOTE — ED Triage Notes (Signed)
C/o CBG 170 at home, headache, and feeling like she is going to pass out.  States symptoms start when her blood sugar gets high. No neuro deficits.

## 2018-09-01 ENCOUNTER — Telehealth: Payer: Self-pay | Admitting: Family Medicine

## 2018-09-01 ENCOUNTER — Other Ambulatory Visit: Payer: Self-pay

## 2018-09-01 ENCOUNTER — Ambulatory Visit (INDEPENDENT_AMBULATORY_CARE_PROVIDER_SITE_OTHER): Payer: Self-pay | Admitting: Family Medicine

## 2018-09-01 DIAGNOSIS — E1165 Type 2 diabetes mellitus with hyperglycemia: Secondary | ICD-10-CM

## 2018-09-01 DIAGNOSIS — F419 Anxiety disorder, unspecified: Secondary | ICD-10-CM

## 2018-09-01 DIAGNOSIS — I10 Essential (primary) hypertension: Secondary | ICD-10-CM

## 2018-09-01 DIAGNOSIS — F329 Major depressive disorder, single episode, unspecified: Secondary | ICD-10-CM

## 2018-09-01 DIAGNOSIS — F32A Depression, unspecified: Secondary | ICD-10-CM

## 2018-09-01 NOTE — Telephone Encounter (Signed)
Pt came in office requesting to get a call regarding her previous lab results 08/22/2018. Best phone # to contact [phone].

## 2018-09-01 NOTE — Patient Instructions (Signed)
It was great meeting you today!  I think your symptoms are likely physical manifestations of your anxiety.  I will have you meet with our behavioral health expert for further care.  For all of your chronic problems such as your diabetes hypertension I recommend meeting with Dr. Jeannine Kitten.  On the way out please schedule an appointment for the first week of August with him.  Fue un placer conocerte hoy! Creo que sus sntomas son probablemente manifestaciones fsicas de su ansiedad. Tendr que reunirse con nuestro experto en salud del comportamiento para recibir ms atencin. Para todos sus problemas crnicos, como su hipertensin diabtica, recomiendo reunirse con el Dr. Jeannine Kitten. Al salir, programe una cita para la primera semana de agosto con l.

## 2018-09-02 NOTE — Telephone Encounter (Signed)
Called pt using pacific interpreters at the number provided.  No answer.  LVM telling her the labwork from our office visit was normal and we do not need to change any medications based on these results.  If she would like to discuss things further she can do so at our next appointment.

## 2018-09-04 ENCOUNTER — Encounter: Payer: Self-pay | Admitting: Family Medicine
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
## 2018-09-04 NOTE — Assessment & Plan Note (Signed)
Patient reluctant to start metformin ER as opposed to her previously prescribed metformin 875.  Will defer to PCP.  Encourage patient to schedule follow-up in early August.
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
## 2018-09-04 NOTE — Assessment & Plan Note (Signed)
118/68 in clinic.  Continue amlodipine 10 mg daily.

## 2018-09-04 NOTE — Assessment & Plan Note (Signed)
Could potentially be playing a role in patient's recent ED trips and symptomatology.  Patient did not want to perform PHQ 9 does not think these are playing a role.  Again would likely benefit from SSRI, especially given the past medical history.  Place message to behavioral health.

## 2018-09-12 ENCOUNTER — Other Ambulatory Visit: Payer: Self-pay

## 2018-09-12 ENCOUNTER — Encounter: Payer: Self-pay | Admitting: Family Medicine

## 2018-09-12 ENCOUNTER — Ambulatory Visit (INDEPENDENT_AMBULATORY_CARE_PROVIDER_SITE_OTHER): Payer: Self-pay | Admitting: Family Medicine

## 2018-09-12 VITALS — BP 98/58 | HR 85 | Temp 98.1°F | Wt 135.0 lb

## 2018-09-12 DIAGNOSIS — I10 Essential (primary) hypertension: Secondary | ICD-10-CM

## 2018-09-12 DIAGNOSIS — E1142 Type 2 diabetes mellitus with diabetic polyneuropathy: Secondary | ICD-10-CM

## 2018-09-12 DIAGNOSIS — E039 Hypothyroidism, unspecified: Secondary | ICD-10-CM

## 2018-09-12 DIAGNOSIS — E1165 Type 2 diabetes mellitus with hyperglycemia: Secondary | ICD-10-CM

## 2018-09-12 DIAGNOSIS — N898 Other specified noninflammatory disorders of vagina: Secondary | ICD-10-CM

## 2018-09-12 LAB — GLUCOSE, POCT (MANUAL RESULT ENTRY): POC Glucose: 152 mg/dl — AB (ref 70–99)

## 2018-09-12 NOTE — Progress Notes (Signed)
   [facility] Phone: [phone]     Tiffany Calmes - 63 y.o. female MRN [id number]  Date of birth: March 17, 1955  Subjective:   cc: bp, diabetes, vaginal discharge  HPI:  BP: Has been some moments when it gets high,  Patient only taking the amolodipine prescribed at last visit and only taking once a day.    DM: Hasn't taken the metformin XRn I prescribed her.  Just taking the  850 regular pills she had before twice a day.  .  She Feels 'dizzy'  When she thinks her blood sugar getting high.  Gets dizzy until the sugar level decreases. No syncope.   Dizzy now bc she usually eats. Eats every 3-4 hours.  Today she  Ate breakfast and again at noon.  Yogurt with strawberry and then yogurt and tea.  With bread.  Isn't taking her insulin anymore.    Vaginal Discharge: just for a couple of days.  Itching sometimes.  Not sexually active.  No odor.  No dysuria.  Wants a female provider to examine her. .    Thyroid: pt takes her synthroid daily in the mornings.  Cholesterol: taking it for several years. Does not think she needs it.  No muscle pain or weakness.      ROS: See HPI for pertinent positives and negatives  Past Medical History  Family history reviewed for today's visit. No changes.  Social history- patient is a non smoker  Health Maintenance:   Health Maintenance Due  Topic Date Due  . PNEUMOCOCCAL POLYSACCHARIDE VACCINE AGE 45-64 HIGH RISK  11/16/1959  . OPHTHALMOLOGY EXAM  11/16/1967  . TETANUS/TDAP  11/15/1976  . MAMMOGRAM  11/16/2007  . COLONOSCOPY  11/16/2007  . FOOT EXAM  06/25/2017  . URINE MICROALBUMIN  06/25/2017  . INFLUENZA VACCINE  09/10/2018    Objective:   BP (!) 98/58   Pulse 85   Temp 98.1 F (36.7 C) (Oral)   Wt 135 lb (61.2 kg)   SpO2 98%   BMI 25.82 kg/m  Gen: NAD, alert and oriented, cooperative with exam HEENT: NCAT, EOMI, MMM Neck: FROM, supple, no masses CV: normal rate, regular rhythm. No murmurs, no  rubs.  Resp: LCTAB, no wheezes, crackles. normal work of breathing Skin: No rashes, no lesions Psych: quiet.  Looks at the ground often.   Assessment/Plan:   Essential hypertension bp much better today, but more importantly pt states she is only checking her BP once a day. I think this has helped her with her anxiety in regards to her high bp. Continue amlodipine.   Diabetic polyneuropathy associated with type 2 diabetes mellitus ([place]) Pt concerned about switching to her new metformin.  We havew agreed to let her take the 850 regular twice a day as before.  Blood sugar is very well controlled.  No more hypoglycemic events.  But is still associating changes in how she feels with her blood sugar levels.   - repeat a1c in three months.   Hypothyroidism TSH was normal last visit.  No symptoms of hypo/hyperthyroid.  - continue current dose.    Vaginal discharge A few days of whitish discharge with occasoinal pruritus.  Wants female provider for genital exam.   - schedule appt for pelvic exam with female provider.    Orders Placed This Encounter  Procedures  . Glucose (CBG)    Clemetine Marker, MD PGY-2 [facility] Family [facility] Res[facility]

## 2018-09-12 NOTE — Patient Instructions (Signed)
Your blood pressure looks great today.  I am glad that you are only checking your blood pressure once a day.  Continue to take the amlodipine.  We will go back to taking the metformin 850 mg, but only twice a day.  In 2 months I like to check your blood sugar again to see how these changes in medication have affected it.  We may have to increase the dose based on that number at that time.  Continue taking your thyroid and cholesterol medication as prescribed.  Clemetine Marker, MD

## 2018-09-14 DIAGNOSIS — N898 Other specified noninflammatory disorders of vagina: Secondary | ICD-10-CM | POA: Insufficient documentation

## 2018-09-14 NOTE — Assessment & Plan Note (Signed)
A few days of whitish discharge with occasoinal pruritus.  Wants female provider for genital exam.   - schedule appt for pelvic exam with female provider.

## 2018-09-14 NOTE — Assessment & Plan Note (Signed)
bp much better today, but more importantly pt states she is only checking her BP once a day. I think this has helped her with her anxiety in regards to her high bp. Continue amlodipine.

## 2018-09-14 NOTE — Assessment & Plan Note (Signed)
TSH was normal last visit.  No symptoms of hypo/hyperthyroid.  - continue current dose.

## 2018-09-14 NOTE — Assessment & Plan Note (Signed)
Pt concerned about switching to her new metformin.  We havew agreed to let her take the 850 regular twice a day as before.  Blood sugar is very well controlled.  No more hypoglycemic events.  But is still associating changes in how she feels with her blood sugar levels.   - repeat a1c in three months.

## 2018-09-15 NOTE — Patient Instructions (Addendum)
Recomiendo un buen control glucmico para ayudar a prevenir las infecciones por hongos. Es importante continuar haciendo un seguimiento con su mdico de atencin primaria (Dr. Constance Goltzlson) para asegurarse de que su diabetes est bien controlada.    Candidiasis vaginal en los adultos Vaginal Yeast infection, Adult  La infeccin mictica vaginal es una afeccin que causa secrecin vaginal y Educational psychologisttambin dolor, hinchazn y enrojecimiento (inflamacin) de la vagina. Esta es una enfermedad frecuente. Algunas mujeres contraen esta infeccin con frecuencia. Cules son las causas? La causa de la infeccin es un cambio en el equilibrio normal de los hongos (cndida) y las bacterias que viven en la vagina. Esta alteracin deriva en el crecimiento excesivo de los hongos, lo que causa la inflamacin. Qu incrementa el riesgo? La afeccin es ms probable en las mujeres que tienen estas caractersticas:  Toman antibiticos.  Tienen diabetes.  Toman anticonceptivos orales.  Estn embarazadas.  Se hacen duchas vaginales con frecuencia.  Tienen debilitado el sistema de defensa del organismo (sistema inmunitario).  Han estado tomando medicamentos con corticoesteroides durante Con-waymucho tiempo.  Usan ropa ajustada con frecuencia. Cules son los signos o los sntomas? Los sntomas de esta afeccin incluyen los siguientes:  Secrecin vaginal blanca, cremosa y espesa.  Hinchazn, picazn, enrojecimiento e irritacin de la vagina. Los labios de la vagina (vulva) tambin se pueden infectar.  Dolor o ardor al Geographical information systems officerorinar.  Dolor durante las The St. Paul Travelersrelaciones sexuales. Cmo se diagnostica? Esta afeccin se diagnostica en funcin de lo siguiente:  Sus antecedentes mdicos.  Un examen fsico.  Examen plvico. El mdico examinar una muestra de la secrecin vaginal con un microscopio. Probablemente el mdico enve esta muestra al laboratorio para analizarla y confirmar el diagnstico. Cmo se trata? Esta afeccin se  trata con medicamentos. Los United Parcelmedicamentos pueden ser recetados o de [address] lib[address]. Podrn indicarle que use uno o ms de lo siguiente:  Medicamentos que se toman por boca (orales).  Medicamentos que se aplican como una crema (tpicos).  Medicamentos que se colocan directamente en la vagina (vulos vaginales). Siga estas indicaciones en su casa:  Estilo de vida  No tenga relaciones sexuales hasta que el mdico lo autorice. Comunique a su compaero sexual que tiene una infeccin por hongos. Esa persona debera visitar a su mdico y preguntarle si debera recibir tratamiento tambin.  No use ropa ajustada, como pantimedias o pantalones ajustados.  Use ropa interior de algodn, que permite el paso del aire. Indicaciones generales  Tome o aplquese los medicamentos de venta libre y los recetados solamente como se lo haya indicado el mdico.  Consuma ms yogur. Esto puede ayudar a Artistevitar la recurrencia de la candidiasis.  No use tampones hasta que el mdico la autorice.  Intente darse un bao de asiento para Altria Groupaliviar las molestias. Se trata de un bao de agua tibia que se toma mientras se est sentado. El agua solo debe Adult nursellegar hasta las caderas y cubrir las nalgas. Hgalo 3o 4veces al da o como se lo haya indicado el mdico.  No se haga duchas vaginales.  Si tiene diabetes, mantenga bajo control los niveles de Bankerazcar en la sangre.  Concurra a todas las visitas de control como se lo haya indicado el mdico. Esto es importante. Comunquese con un mdico si:  Tiene fiebre.  Los sntomas desaparecen y luego vuelven a Research officer, trade unionaparecer.  Los sntomas no mejoran con Scientist, research (medical)el tratamiento.  Sus sntomas empeoran.  Aparecen nuevos sntomas.  Aparecen ampollas alrededor o adentro de la vagina.  Le sale sangre de la vagina y  no est menstruando.  Siente dolor en el abdomen. Resumen  La infeccin mictica vaginal es una afeccin que causa secrecin y Garment/textile technologist, hinchazn y enrojecimiento  (inflamacin) de la vagina.  Esta afeccin se trata con medicamentos. Los [employer] pueden ser recetados o de venta libre.  Tome o aplquese los medicamentos de venta libre y los recetados solamente como se lo haya indicado el mdico.  No se haga duchas vaginales. No tenga relaciones sexuales ni use tampones hasta que el mdico la autorice.  Comunquese con un mdico si los sntomas no mejoran con el tratamiento o si los sntomas desaparecen y [employer]. Esta informacin no tiene Marine scientist el consejo del mdico. Asegrese de hacerle al mdico cualquier pregunta que tenga. Document Released: 11/05/2004 Document Revised: 08/05/2017 Document Reviewed: 08/05/2017 Elsevier Patient Education  2020 [employer].

## 2018-09-15 NOTE — Progress Notes (Signed)
   Subjective:    Patient ID: Brittany Pineda, female    DOB: [birth year], 63 y.o.   MRN: [id number]   CC: vaginal discharge  HPI: VAGINAL DISCHARGE Onset: 2 weeks   Description: clear, sticky  Odor: no   Itching: yes   Symptoms Dysuria: no  Bleeding: no  Pelvic pain: yes  Back pain: no  Fever: no  Genital sores: no  Rash: no  Dyspareunia: no (no intercourse x6 years) GI Sxs: no  Prior treatment: no   Red Flags: Missed period: no  Pregnancy: no  Recent antibiotics: yes, in May   Sexual activity: no  Possible STD exposure: no  IUD: no  Diabetes: yes    Objective:  BP 122/68   Pulse 81   Wt 138 lb (62.6 kg)   SpO2 100%   BMI 26.39 kg/m  Vitals and nursing note reviewed  General: well nourished, in no acute distress HEENT: normocephalic, no scleral icterus or conjunctival pallor Cardiac: RRR, clear S1 and S2, no murmurs, rubs, or gallops Respiratory: clear to auscultation bilaterally, no increased work of breathing Abdomen: soft, nontender, nondistended, no masses or organomegaly. Bowel sounds present Extremities: no edema or cyanosis. Warm, well perfused.  Skin: warm and dry, no rashes noted Neuro: alert and oriented, no focal deficits Female genitalia: normal external genitalia, vulva, vagina, cervix, uterus and adnexa, thick white discharge noted   Assessment & Plan:    Vaginal yeast infection Vaginal discharge consistent with yeast infection.  Wet prep was negative, however given low sensitivity and specificity of lab with high clinical suspicion will plan to treat as yeast vaginal infection.  Patient has a history of poorly controlled diabetes which is likely contributing.  Will prescribe Diflucan to be used once today and a second pill in 72 hours.  Strict return precautions given.  Follow-up if no improvement.  Advised that patient have strict control of diabetes to help reduce the chances of developing yeast infections.  Advised that  patient follow-up with PCP for diabetes follow-up.    Return if symptoms worsen or fail to improve.   Caroline More, DO, PGY-3

## 2018-09-16 ENCOUNTER — Other Ambulatory Visit: Payer: Self-pay

## 2018-09-16 ENCOUNTER — Ambulatory Visit (INDEPENDENT_AMBULATORY_CARE_PROVIDER_SITE_OTHER): Payer: Self-pay | Admitting: Family Medicine

## 2018-09-16 ENCOUNTER — Encounter: Payer: Self-pay | Admitting: Family Medicine

## 2018-09-16 VITALS — BP 122/68 | HR 81 | Wt 138.0 lb

## 2018-09-16 DIAGNOSIS — N898 Other specified noninflammatory disorders of vagina: Secondary | ICD-10-CM

## 2018-09-16 DIAGNOSIS — B373 Candidiasis of vulva and vagina: Secondary | ICD-10-CM

## 2018-09-16 DIAGNOSIS — B3731 Acute candidiasis of vulva and vagina: Secondary | ICD-10-CM | POA: Insufficient documentation

## 2018-09-16 LAB — POCT WET PREP (WET MOUNT)
Clue Cells Wet Prep Whiff POC: NEGATIVE
Trichomonas Wet Prep HPF POC: ABSENT

## 2018-09-16 MED ORDER — FLUCONAZOLE 150 MG PO TABS: ORAL_TABLET | ORAL | 0 refills | Status: DC

## 2018-09-16 NOTE — Assessment & Plan Note (Signed)
Vaginal discharge consistent with yeast infection.  Wet prep was negative, however given low sensitivity and specificity of lab with high clinical suspicion will plan to treat as yeast vaginal infection.  Patient has a history of poorly controlled diabetes which is likely contributing.  Will prescribe Diflucan to be used once today and a second pill in 72 hours.  Strict return precautions given.  Follow-up if no improvement.  Advised that patient have strict control of diabetes to help reduce the chances of developing yeast infections.  Advised that patient follow-up with PCP for diabetes follow-up.

## 2018-09-26 ENCOUNTER — Ambulatory Visit: Payer: Self-pay | Admitting: Family Medicine

## 2018-09-28 ENCOUNTER — Ambulatory Visit (INDEPENDENT_AMBULATORY_CARE_PROVIDER_SITE_OTHER): Payer: Self-pay | Admitting: Family Medicine

## 2018-09-28 ENCOUNTER — Other Ambulatory Visit: Payer: Self-pay

## 2018-09-28 ENCOUNTER — Encounter: Payer: Self-pay | Admitting: Family Medicine

## 2018-09-28 VITALS — BP 110/64 | HR 75

## 2018-09-28 DIAGNOSIS — E1165 Type 2 diabetes mellitus with hyperglycemia: Secondary | ICD-10-CM

## 2018-09-28 DIAGNOSIS — R42 Dizziness and giddiness: Secondary | ICD-10-CM

## 2018-09-28 DIAGNOSIS — E162 Hypoglycemia, unspecified: Secondary | ICD-10-CM

## 2018-09-28 LAB — GLUCOSE, POCT (MANUAL RESULT ENTRY): POC Glucose: 204 mg/dl — AB (ref 70–99)

## 2018-09-28 NOTE — Progress Notes (Signed)
[facility] Phone: [phone]     Brittany Pineda - 63 y.o. female MRN [id number]  Date of birth: [birth year]  Subjective:   cc: Dizziness  HPI:  Dizziness: Patient states she feels dizzy in the morning even if she has a normal blood sugar.  She states she is more dizzy after taking her blood pressure medication.  She has not had a syncopal episode.  Patient in the morning eats about 16 ounces of water, has an egg, yogurt, and some fruit.  She states the dizziness starts to feel better after lunch.  More she walks the more the sensation of dizziness fades away.  Patient states she drinks 4 bottles of water a day.  Her lunch consists of enchiladas and apple.  Diabetes: When patient was taking the 850 mg of regular metformin she states she could eat everything and would still see a low blood sugars.  Currently she is taking 500 mg of the XR metformin twice a day.  She is started this yesterday morning.  She states some of her blood glucose readings have been in the 20s.  She would like to know what she should do after drinking the juice when this occurs.  Patient also wants to know why her blood sugar increases in the morning before she eats anything.  Her blood sugar she says for example, will go from 155-176 in the morning before she has anything to eat or drink.  ROS: See HPI for pertinent positives and negatives  Past Medical History  Family history reviewed for today's visit. No changes.  Social history- patient is a non-smoker  Health Maintenance:  -  Health Maintenance Due  Topic Date Due  . PNEUMOCOCCAL POLYSACCHARIDE VACCINE AGE 6-64 HIGH RISK  11/16/1959  . OPHTHALMOLOGY EXAM  11/16/1967  . TETANUS/TDAP  11/15/1976  . MAMMOGRAM  11/16/2007  . COLONOSCOPY  11/16/2007  . FOOT EXAM  06/25/2017  . URINE MICROALBUMIN  06/25/2017  . INFLUENZA VACCINE  09/10/2018    Objective:   BP 110/64   Pulse 75   SpO2 98%  Gen: NAD, alert and  oriented, cooperative with exam CV: normal rate, regular rhythm. No murmurs, no rubs.  Resp: LCTAB, no wheezes, crackles. normal work of breathing Psych: Patient generally quiet and often looks at the floor.  Consistent with previous encounters.  Assessment/Plan:   Dizziness Occurs only in the morning and improves with activity.  No syncopal or presyncopal events so far.  No symptoms of vertigo.  Could be due to decreased p.o. intake given the timing of it.  Orthostatic vitals were obtained in the office and were normal. -Advised patient to increase p.o. intake in the morning. -We will reevaluate in 2 weeks after patient has been on new dose of metformin and has been using new glucometer.  Poorly controlled type 2 diabetes mellitus Patient now taking the Metformin XR 500 mg twice a day.  States she has had this multiple CBGs in the 20s at home.  I doubt she is actually having blood sugars in the 20s.  I suspect her glucometer is not functioning. - Continue Metformin XR 500 mg twice a day. - Advised patient to get new glucometer from [facility] for $9 and then bring this to next appointment so that we can look at results.  If patient is really having blood sugars in the 20s while not on any insulin, will need to work her up for insulinoma.   Orders Placed This Encounter  Procedures  . Glucose (CBG)   Return in about 2 weeks (around 10/12/2018) for DM.  Frederic Jerichoan Olson, MD PGY-2 [facility]

## 2018-09-28 NOTE — Patient Instructions (Signed)
  Please do not restrict how much you eat and drink at breakfast, as long as you are eating low carbohydrate foods and avoiding juice unless your blood sugar is very low.  Try to drink 2 bottles of water before lunch.  I would like you to get a new blood sugar meter to make sure that your machine is accurate.  The one below is at [facility] and is $9.  I would like you to come back in 2 weeks so I can see what your blood sugar readings are on the machine.

## 2018-10-01 DIAGNOSIS — R42 Dizziness and giddiness: Secondary | ICD-10-CM | POA: Insufficient documentation

## 2018-10-01 NOTE — Assessment & Plan Note (Addendum)
Occurs only in the morning and improves with activity.  No syncopal or presyncopal events so far.  No symptoms of vertigo.  Could be due to decreased p.o. intake given the timing of it.  Orthostatic vitals were obtained in the office and were normal. -Advised patient to increase p.o. intake in the morning. -We will reevaluate in 2 weeks after patient has been on new dose of metformin and has been using new glucometer.

## 2018-10-01 NOTE — Assessment & Plan Note (Signed)
Patient now taking the Metformin XR 500 mg twice a day.  States she has had this multiple CBGs in the 20s at home.  I doubt she is actually having blood sugars in the 20s.  I suspect her glucometer is not functioning. - Continue Metformin XR 500 mg twice a day. - Advised patient to get new glucometer from [facility] for $9 and then bring this to next appointment so that we can look at results.  If patient is really having blood sugars in the 20s while not on any insulin, will need to work her up for insulinoma.

## 2018-10-26 ENCOUNTER — Encounter: Payer: Self-pay | Admitting: Family Medicine

## 2018-10-26 ENCOUNTER — Other Ambulatory Visit: Payer: Self-pay

## 2018-10-26 ENCOUNTER — Ambulatory Visit (INDEPENDENT_AMBULATORY_CARE_PROVIDER_SITE_OTHER): Payer: Self-pay | Admitting: Family Medicine

## 2018-10-26 DIAGNOSIS — Z599 Problem related to housing and economic circumstances, unspecified: Secondary | ICD-10-CM

## 2018-10-26 DIAGNOSIS — E1165 Type 2 diabetes mellitus with hyperglycemia: Secondary | ICD-10-CM

## 2018-10-26 DIAGNOSIS — Z0000 Encounter for general adult medical examination without abnormal findings: Secondary | ICD-10-CM

## 2018-10-26 DIAGNOSIS — R002 Palpitations: Secondary | ICD-10-CM | POA: Insufficient documentation

## 2018-10-26 DIAGNOSIS — I10 Essential (primary) hypertension: Secondary | ICD-10-CM

## 2018-10-26 NOTE — Progress Notes (Signed)
     SUBJECTIVE CC: Tachycardia  IRS:WNIOEV Brittany Pineda is a 63 y.o. female who presents today with the following problems:  Odd Heart Movement On Monday night, 8:30 pm took blood pressure medication (amlodipine). She reports that she was just sitting down and all of the sudden, her heart had a lot of movement, like violent. She felt that it moved differently three times and then felt like she was going to faint. No chest tightness, pressure. She felt like her blood pressure raised. She took her BP and it was 035 systolic. She has continued to take her BP medication without repeat episode. She has not had any further episodes since.   Pertinent PMFSHx: Anxiety, depression, NIDDM, HTN, HLD ROS: Pertinent ROS included in HPI.    OBJECTIVE Physical Exam:  BP 110/61   Pulse 78   Temp 98.7 F (37.1 C) (Oral)   SpO2 97%  General: NAD, non-toxic, well-appearing, sitting comfortably in chair   HEENT: /AT. PERRLA. EOMI.  Cardiovascular: RRR, normal S1, S2. B/L 2+ RP. No BLEE. <1 s cap refill.  Respiratory: CTAB. No IWOB.  Extremities: Warm and well perfused. Moving spontaneously.  Integumentary: No obvious rashes, lesions, trauma on general exam.    ASSESSMENT & PLAN Problem List Items Addressed This Visit      Unprioritized   Essential hypertension    Would like to discuss with Dr .Jeannine Kitten about decreasing BP medication because she thinks it is a little too strong.       Health care maintenance    Addressed health maintenance with patient and she reports that she is needing to apply for financial assistance.       Heart palpitations    She has a notebook of her blood pressures and they look very good. In the 009-381'W systolic. Last TSH was wnl and in 08/2018.  She denies any rise in heart rate. No SOB. No repeat episodes. Can consider   - Reassured patient about feeling in heart.  - unlikely to be pathologic as patient has not had repeat episodes - if repeat episodes,  recheck TSH and referral to cardiology       Need for financial support    Unable to address health maintenance items 2/2 to no insurance       Poorly controlled type 2 diabetes mellitus ([place])    Would also like to discuss increasing metformin as she used to be on 850 mg and now is on 500 mg. Appears that at last visit, she complained of CBG in the 20's. Her values appear in the mid 100's for the month of September           Follow-up: Return if problem recurs,  worsens, or new problem develops.  Wilber Oliphant, M.D.  PGY-2  Family Medicine  [phone] 10/27/2018 2:56 PM

## 2018-10-26 NOTE — Patient Instructions (Signed)
Dear Brittany Pineda,   It was good to see you! Thank you for taking your time to come in to be seen. Today, we discussed the following:  Please come back to discuss your medication concerns with Dr. Jeannine Kitten. You appt is listed as below:   Future Appointments  Date Time Provider [place]  11/08/2018  3:35 PM Benay Pike, MD [facility]    Be well,   Zettie Cooley, M.D   [place] [phone]  *Sign up for MyChart for instant access to your health profile, labs, orders, upcoming appointments or to contact your provider with questions*  ===================================================================================

## 2018-10-26 NOTE — Assessment & Plan Note (Signed)
She has a notebook of her blood pressures and they look very good. In the 031-594'V systolic. Last TSH was wnl and in 08/2018.  She denies any rise in heart rate. No SOB. No repeat episodes. Can consider   - Reassured patient about feeling in heart.  - unlikely to be pathologic as patient has not had repeat episodes - if repeat episodes, recheck TSH and referral to cardiology

## 2018-10-27 ENCOUNTER — Encounter: Payer: Self-pay | Admitting: Family Medicine

## 2018-10-27 DIAGNOSIS — Z599 Problem related to housing and economic circumstances, unspecified: Secondary | ICD-10-CM | POA: Insufficient documentation

## 2018-10-27 DIAGNOSIS — Z0000 Encounter for general adult medical examination without abnormal findings: Secondary | ICD-10-CM | POA: Insufficient documentation

## 2018-10-27 NOTE — Assessment & Plan Note (Signed)
Would like to discuss with Dr .Jeannine Kitten about decreasing BP medication because she thinks it is a little too strong.

## 2018-10-27 NOTE — Assessment & Plan Note (Signed)
Would also like to discuss increasing metformin as she used to be on 850 mg and now is on 500 mg. Appears that at last visit, she complained of CBG in the 20's. Her values appear in the mid 100's for the month of September

## 2018-10-27 NOTE — Assessment & Plan Note (Signed)
Unable to address health maintenance items 2/2 to no insurance

## 2018-10-27 NOTE — Assessment & Plan Note (Signed)
Addressed health maintenance with patient and she reports that she is needing to apply for financial assistance.

## 2018-11-08 ENCOUNTER — Encounter: Payer: Self-pay | Admitting: Family Medicine

## 2018-11-08 ENCOUNTER — Other Ambulatory Visit: Payer: Self-pay

## 2018-11-08 ENCOUNTER — Ambulatory Visit (INDEPENDENT_AMBULATORY_CARE_PROVIDER_SITE_OTHER): Payer: Self-pay | Admitting: Family Medicine

## 2018-11-08 VITALS — BP 108/59 | HR 85 | Temp 97.8°F | Wt 137.0 lb

## 2018-11-08 DIAGNOSIS — E1165 Type 2 diabetes mellitus with hyperglycemia: Secondary | ICD-10-CM

## 2018-11-08 DIAGNOSIS — R002 Palpitations: Secondary | ICD-10-CM

## 2018-11-08 DIAGNOSIS — Z599 Problem related to housing and economic circumstances, unspecified: Secondary | ICD-10-CM

## 2018-11-08 DIAGNOSIS — I10 Essential (primary) hypertension: Secondary | ICD-10-CM

## 2018-11-08 LAB — POCT GLYCOSYLATED HEMOGLOBIN (HGB A1C): HbA1c, POC (controlled diabetic range): 7.8 % — AB (ref 0.0–7.0)

## 2018-11-08 MED ORDER — METFORMIN HCL 850 MG PO TABS: 850.0000 mg | ORAL_TABLET | Freq: Two times a day (BID) | ORAL | 2 refills | Status: DC

## 2018-11-08 NOTE — Progress Notes (Signed)
[facility] Phone: [phone]     Brittany Pineda - 63 y.o. female MRN [id number]  Date of birth: [birth year]  Subjective:   cc: HTN, DM   HPI:  HTN: She is no longer having complaints of her blood pressure being too high.  She has stopped taking her blood pressure multiple times a day.  DM: The patient states her blood sugar readings have been elevated recently.  She got the new meter from [facility] and no longer has low readings.  Her readings now range from the 1 50-2 20 range.  She is taking metformin 500 twice daily.  She is okay with going up to 850 twice a day.  Chest pain: Patient endorsed 1 episode of arrhythmia that felt like "her heart was doing somersaults" she felt like she was going to "pass out" but did not have a syncopal episode.  It resolved in a minute or so and she has not had any further episodes.  She states she went to the office after this and was checked out and was told everything was normal.  Insurance: Patient does not have insurance.  She used to have the orange card but it lapsed when she had moved away for a while.  She has had difficulty in reapplying.  She would like to know where she needs to send her application.  ROS: See HPI for pertinent positives and negatives  Family history reviewed for today's visit. No changes.  Social history- patient is a former smoker  Objective:   BP (!) 108/59    Pulse 85    Temp 97.8 F (36.6 C) (Oral)    Wt 137 lb (62.1 kg)    SpO2 97%    BMI 26.20 kg/m  Gen: NAD, alert and oriented, cooperative with exam CV: normal rate, regular rhythm. No murmurs, no rubs.  Psych: patient has blunted affect and sparse eye contact with soft voice, consistent with previous exams.    Assessment/Plan:   Poorly controlled type 2 diabetes mellitus Patient no longer having hypoglycemic episodes since getting her new meter which is showed ranges from 150-220.  Has been some confusion between me and  patient with which dose of metformin she is taking.  I have prescribed her doses in the past which she then does not take in we will continue to take her previous doses.  We have discussed this and come to agreement that she will take 850 mg twice a day metformin with the chance that we may have to increase to 1000 twice daily in the future.  Her A1c was 7.8.  This is likely due to only being on 500 mg metformin.  We have limited options and adding an additional agent given her lack of insurance and her tendency to have-hypoglycemia.  We will try to avoid putting her back on any insulin if possible.  After we have maximized metformin, the next agent would likely have to be a low dose sulfonylurea once a day.  Essential hypertension This is much more well controlled now that patient is no longer checking her blood pressure up to 10 times a day.  Her blood pressure today was 108/59.  Continue current regimen  Heart palpitations Patient has already been seen in our clinic for this.  It was one episode that resolved within a minute.  Most likely this was a PVC and I informed the patient of this and that these are very common.  Patient starts to have excessive  PVCs will need to get EKG, consider a Holter monitor if it is affordable and possible referral to cardiology, but the patient does not have insurance at this time.  Need for financial support Discussed with patient the orange card which she is used in the past.  She says she has the application filled out she just does not know who assented to.  I informed her that she can bring it to our office and drop it off with Cleatrice Burke will then call her as necessary.  I spoke with Annice Pih personally about this.  Patient will drop off orange card information with [place].  Frederic Jericho, MD PGY-2 [facility]

## 2018-11-08 NOTE — Patient Instructions (Addendum)
Please schedule an appointment with me for 3 months from now.    I will write you a new prescription for 850mg  Metformin.  Please take it twice a day.    Please bring your orange card to our clinic and drop it off.  Kennyth Lose will then call you if she needs to talk to you about anything.  She will use an interpreter.    Have a great day,   Clemetine Marker, MD.

## 2018-11-08 NOTE — Progress Notes (Deleted)
   [facility] Phone: [phone]     Brittany Pineda - 63 y.o. female MRN [id number]  Date of birth: 1955-09-09  Subjective:   cc: ***  HPI:  ***  ROS: See HPI for pertinent positives and negatives  Past Medical History  Family history reviewed for today's visit. No changes.  Social history- patient is a *** smoker  Health Maintenance:  - *** Health Maintenance Due  Topic Date Due  . PNEUMOCOCCAL POLYSACCHARIDE VACCINE AGE 8-64 HIGH RISK  11/16/1959  . OPHTHALMOLOGY EXAM  11/16/1967  . TETANUS/TDAP  11/15/1976  . MAMMOGRAM  11/16/2007  . COLONOSCOPY  11/16/2007  . FOOT EXAM  06/25/2017  . URINE MICROALBUMIN  06/25/2017    -  reports that she has quit smoking. She has never used smokeless tobacco.  Objective:   BP (!) 108/59   Pulse 85   Temp 97.8 F (36.6 C) (Oral)   Wt 137 lb (62.1 kg)   SpO2 97%   BMI 26.20 kg/m  Gen: NAD, alert and oriented, cooperative with exam HEENT: NCAT, EOMI, MMM Neck: FROM, supple, no masses CV: normal rate, regular rhythm. No murmurs, no rubs.  Resp: LCTAB, no wheezes, crackles. normal work of breathing GI: nontender to palpation, BS present, no guarding or organomegaly Msk: No edema, warm, normal tone, moves UE/LE spontaneously Neuro: CN II-XII grossly intact. no gross deficits Skin: No rashes, no lesions Psych: Appropriate behavior  Assessment/Plan:   No problem-specific Assessment & Plan notes found for this encounter.   No orders of the defined types were placed in this encounter.   No orders of the defined types were placed in this encounter.    Clemetine Marker, MD PGY-2 [facility] Medicine Res[facility]

## 2018-11-09 NOTE — Assessment & Plan Note (Signed)
Discussed with patient the orange card which she is used in the past.  She says she has the application filled out she just does not know who assented to.  I informed her that she can bring it to our office and drop it off with Fenton Foy will then call her as necessary.  I spoke with Kennyth Lose personally about this.  Patient will drop off orange card information with [place].

## 2018-11-09 NOTE — Assessment & Plan Note (Addendum)
Patient no longer having hypoglycemic episodes since getting her new meter which is showed ranges from 150-220.  Has been some confusion between me and patient with which dose of metformin she is taking.  I have prescribed her doses in the past which she then does not take in we will continue to take her previous doses.  We have discussed this and come to agreement that she will take 850 mg twice a day metformin with the chance that we may have to increase to 1000 twice daily in the future.  Her A1c was 7.8.  This is likely due to only being on 500 mg metformin.  We have limited options and adding an additional agent given her lack of insurance and her tendency to have-hypoglycemia.  We will try to avoid putting her back on any insulin if possible.  After we have maximized metformin, the next agent would likely have to be a low dose sulfonylurea once a day.

## 2018-11-09 NOTE — Assessment & Plan Note (Signed)
This is much more well controlled now that patient is no longer checking her blood pressure up to 10 times a day.  Her blood pressure today was 108/59.  Continue current regimen

## 2018-11-09 NOTE — Assessment & Plan Note (Signed)
Patient has already been seen in our clinic for this.  It was one episode that resolved within a minute.  Most likely this was a PVC and I informed the patient of this and that these are very common.  Patient starts to have excessive PVCs will need to get EKG, consider a Holter monitor if it is affordable and possible referral to cardiology, but the patient does not have insurance at this time.

## 2018-11-29 ENCOUNTER — Other Ambulatory Visit: Payer: Self-pay

## 2018-11-29 ENCOUNTER — Telehealth: Payer: Self-pay | Admitting: Family Medicine

## 2018-11-29 DIAGNOSIS — E039 Hypothyroidism, unspecified: Secondary | ICD-10-CM

## 2018-11-29 NOTE — Telephone Encounter (Signed)
Pt only has a few pills left. Please call pt when Rx is sent. Ottis Stain, CMA

## 2018-11-29 NOTE — Telephone Encounter (Signed)
Tried calling pt using [employer] [phone]). No answer and VM not set up. If pt calls, please get the  Information below for Dr. Jeannine Kitten. Ottis Stain, CMA

## 2018-11-29 NOTE — Telephone Encounter (Signed)
Patient came into office to get refill on her medication levothroxine. Please give pt a call, pt still has a few pills but is about to run out.

## 2018-11-29 NOTE — Telephone Encounter (Signed)
Can you call the patient and ask her what her correct dose is for synthroid? It says she reports taking 41mcg but rx is for 1109mcg.

## 2018-11-29 NOTE — Telephone Encounter (Signed)
Refill request sent to Dr. Jeannine Kitten with a request for a call to pt once refill has been sent.Ottis Stain, CMA

## 2018-12-01 ENCOUNTER — Other Ambulatory Visit: Payer: Self-pay | Admitting: Family Medicine

## 2018-12-01 DIAGNOSIS — E039 Hypothyroidism, unspecified: Secondary | ICD-10-CM

## 2018-12-01 MED ORDER — LEVOTHYROXINE SODIUM 50 MCG PO TABS: 50.0000 ug | ORAL_TABLET | Freq: Every day | ORAL | 1 refills | Status: DC

## 2018-12-01 NOTE — Telephone Encounter (Signed)
Called pt, no answer. Unable to leave voicemail.

## 2018-12-01 NOTE — Progress Notes (Signed)
Looks like previously in July she was dispensed 50mcg tabs.  Will continue that dose.

## 2018-12-09 NOTE — Telephone Encounter (Signed)
Called pt, no answer and VM not set up. Ottis Stain, CMA

## 2018-12-13 ENCOUNTER — Encounter: Payer: Self-pay | Admitting: *Deleted

## 2018-12-13 NOTE — Telephone Encounter (Signed)
Due to 3 attempts to contact pt will create a letter requesting her to call our office.Brittany Pineda, CMA

## 2019-02-05 ENCOUNTER — Other Ambulatory Visit: Payer: Self-pay | Admitting: Family Medicine

## 2019-03-03 ENCOUNTER — Encounter: Payer: Self-pay | Admitting: Family Medicine

## 2019-03-03 ENCOUNTER — Ambulatory Visit (INDEPENDENT_AMBULATORY_CARE_PROVIDER_SITE_OTHER): Payer: Self-pay | Admitting: Family Medicine

## 2019-03-03 ENCOUNTER — Other Ambulatory Visit: Payer: Self-pay

## 2019-03-03 VITALS — BP 116/64 | HR 79 | Wt 145.2 lb

## 2019-03-03 DIAGNOSIS — E1165 Type 2 diabetes mellitus with hyperglycemia: Secondary | ICD-10-CM

## 2019-03-03 DIAGNOSIS — E039 Hypothyroidism, unspecified: Secondary | ICD-10-CM

## 2019-03-03 DIAGNOSIS — I10 Essential (primary) hypertension: Secondary | ICD-10-CM

## 2019-03-03 DIAGNOSIS — E782 Mixed hyperlipidemia: Secondary | ICD-10-CM

## 2019-03-03 LAB — POCT GLYCOSYLATED HEMOGLOBIN (HGB A1C): HbA1c, POC (controlled diabetic range): 8.8 % — AB (ref 0.0–7.0)

## 2019-03-03 MED ORDER — SIMVASTATIN 40 MG PO TABS: 40.0000 mg | ORAL_TABLET | Freq: Every day | ORAL | 6 refills | Status: DC

## 2019-03-03 MED ORDER — GLIPIZIDE 5 MG PO TABS: 5.0000 mg | ORAL_TABLET | Freq: Every day | ORAL | 3 refills | Status: DC

## 2019-03-03 NOTE — Patient Instructions (Addendum)
It was nice to see you again today,  Your blood sugar is going up again.  The best thing we can do is avoid eating high carbohydrate foods such as tortillas, rice, bread.  I have also prescribed you a medicine called glipizide.  Please take this in the morning and only take it once a day.  If you ever see that your blood sugars are less than 90 while taking it, please call us and let us know.  I have refilled your statin medication for cholesterol.  I have ordered a TSH to check for your thyroid function.  I will call you with results and tell you if we need to change your medication for thyroid medicine.  Please follow-up in 3 months.  Fue bueno verte de [employer],  Su nivel de azcar en sangre vuelve a subir. Lo mejor que podemos hacer es evitar comer alimentos ricos en carbohidratos como tortillas, arroz, pan.  Tambin le he recetado un medicamento llamado glipizida. Tmelo por la Diannia Ruder y solo tmelo una vez al da. Si alguna vez observa que su nivel de azcar en sangre es inferior a 90 mientras lo toma, llmenos y avsenos.  He rellenado su medicamento con estatinas para el colesterol.  He ordenado una TSH para comprobar su funcin tiroidea. Lo llamar para informarle los resultados y le dir si necesitamos cambiar su medicamento por medicamentos para la tiroides.  Realice un seguimiento en 3 meses.  Have a great day,  Frederic Jericho, MD

## 2019-03-03 NOTE — Progress Notes (Signed)
   [facility] Phone: [phone]     Brittany Pineda - 64 y.o. female MRN [id number]  Date of birth: Mar 30, 1955  Subjective:   cc: Diabetes, HTN, hypothyroid  HPI:  Diabetes: takes her 850 mg Metformin 3 times a day.she has been eating food with lots of rice, bread, tortilla.  She has not had any hypoglycemic episodes.  Denies polyuria, polydipsia.  Checks her blood sugar once a day.  HTN: twice this month she felt it her blood pressure too high, most recent time she checked it and it was 155 systolic.  She felt nauseous.  She is taking amlodipine daily.  Is not taking more than the prescribed dose.  Hypothyroidism: Patient has been taking her Synthroid as prescribed.  Denies fatigue, denies jitteriness, palpitations  ROS: See HPI for pertinent positives and negatives  Family history reviewed for today's visit. No changes.  Social history- patient is a non-smoker   Objective:   BP 116/64   Pulse 79   Wt 145 lb 3.2 oz (65.9 kg)   SpO2 98%   BMI 27.77 kg/m  Gen: Alert and oriented.  No acute distress.  Spanish-speaking. CV: Regular rate and rhythm, no murmurs.  2+ pulses radial bilaterally. Resp: Lungs clear to auscultation bilaterally.  No wheezes, no crackles Psych: Patient appears anxious at baseline.  Often looks down and speaks in a low, soft voice.  This is consistent with previous encounters.  Assessment/Plan:   Poorly controlled type 2 diabetes mellitus A1c is creeping up again.  8.8 today.  This is despite the patient taking her Metformin 3 times instead of 2 times a day as prescribed.  Patient is tolerating this dose well though so I will just make this her normal dose.  We will add glipizide 5 mg every morning with breakfast.  Patient cannot afford other oral medications such as GLP-1's.  I am trying to avoid putting her back on insulin for as long as possible because the patient has poor health literacy and was injecting herself  with too much insulin last time she was on it causing multiple hypoglycemic episodes.  Essential hypertension BP today was 116/64.  Patient keeps a log of blood pressures that she thankfully is still only measuring once a day.  Several of which fluctuate between 95 and 110 systolic.  MAPs still within normal range.  Patient not symptomatic of hypotensive episodes. -Continue current medication, amlodipine 10 mg  Hypothyroidism Patient adherent to medication.  Will get TSH today as it has been 6 months since last measurement.  Will make changes as needed.     Frederic Jericho, MD PGY-2 [facility] Family Medicine Residency

## 2019-03-04 LAB — TSH: TSH: 7.86 u[IU]/mL — ABNORMAL HIGH (ref 0.450–4.500)

## 2019-03-04 NOTE — Assessment & Plan Note (Signed)
Patient adherent to medication.  Will get TSH today as it has been 6 months since last measurement.  Will make changes as needed.

## 2019-03-04 NOTE — Assessment & Plan Note (Signed)
BP today was 116/64.  Patient keeps a log of blood pressures that she thankfully is still only measuring once a day.  Several of which fluctuate between 95 and 110 systolic.  MAPs still within normal range.  Patient not symptomatic of hypotensive episodes. -Continue current medication, amlodipine 10 mg

## 2019-03-04 NOTE — Assessment & Plan Note (Signed)
A1c is creeping up again.  8.8 today.  This is despite the patient taking her Metformin 3 times instead of 2 times a day as prescribed.  Patient is tolerating this dose well though so I will just make this her normal dose.  We will add glipizide 5 mg every morning with breakfast.  Patient cannot afford other oral medications such as GLP-1's.  I am trying to avoid putting her back on insulin for as long as possible because the patient has poor health literacy and was injecting herself with too much insulin last time she was on it causing multiple hypoglycemic episodes.

## 2019-03-06 ENCOUNTER — Other Ambulatory Visit: Payer: Self-pay | Admitting: Family Medicine

## 2019-03-06 DIAGNOSIS — E039 Hypothyroidism, unspecified: Secondary | ICD-10-CM

## 2019-03-06 MED ORDER — LEVOTHYROXINE SODIUM 75 MCG PO TABS: 75.0000 ug | ORAL_TABLET | Freq: Every day | ORAL | 1 refills | Status: DC

## 2019-03-06 NOTE — Progress Notes (Signed)
Increased pt's synthroid from to 75 mcg d/t mildly elevated TSH on last visit.  She will also need to come in for a lab visit in 6 weeks for recheck of TSH.  Can we call her and let her know of this change? She will need a spanish interpreter.

## 2019-03-07 NOTE — Progress Notes (Signed)
Called pt using [employer]. [place], [phone]). Pt did not answer and VM is not set up. Sunday Spillers, CMA

## 2019-04-17 ENCOUNTER — Other Ambulatory Visit: Payer: Self-pay | Admitting: Family Medicine

## 2019-06-14 ENCOUNTER — Telehealth: Payer: Self-pay | Admitting: Family Medicine

## 2019-06-14 NOTE — Telephone Encounter (Signed)
Pt is scheduled with Dr. Raymondo Band on May 10th to discuss meds. And want labs done.  Can an order be put in for labs on same day?

## 2019-06-15 ENCOUNTER — Other Ambulatory Visit: Payer: Self-pay | Admitting: Family Medicine

## 2019-06-15 DIAGNOSIS — E039 Hypothyroidism, unspecified: Secondary | ICD-10-CM

## 2019-06-15 DIAGNOSIS — I10 Essential (primary) hypertension: Secondary | ICD-10-CM

## 2019-06-15 DIAGNOSIS — E1165 Type 2 diabetes mellitus with hyperglycemia: Secondary | ICD-10-CM

## 2019-06-15 NOTE — Telephone Encounter (Signed)
A1c, tsh, and bmp were placed

## 2019-06-16 NOTE — Telephone Encounter (Signed)
Note placed in appointment notes that labs have been ordered for pt to have completed at this visit.Brittany Pineda, CMA

## 2019-06-19 ENCOUNTER — Ambulatory Visit: Payer: Self-pay | Admitting: Pharmacist

## 2019-06-20 ENCOUNTER — Ambulatory Visit (INDEPENDENT_AMBULATORY_CARE_PROVIDER_SITE_OTHER): Payer: Self-pay | Admitting: Pharmacist

## 2019-06-20 ENCOUNTER — Encounter: Payer: Self-pay | Admitting: Pharmacist

## 2019-06-20 ENCOUNTER — Other Ambulatory Visit: Payer: Self-pay

## 2019-06-20 DIAGNOSIS — E039 Hypothyroidism, unspecified: Secondary | ICD-10-CM

## 2019-06-20 DIAGNOSIS — E1165 Type 2 diabetes mellitus with hyperglycemia: Secondary | ICD-10-CM

## 2019-06-20 DIAGNOSIS — I10 Essential (primary) hypertension: Secondary | ICD-10-CM

## 2019-06-20 MED ORDER — TRULICITY 0.75 MG/0.5ML ~~LOC~~ SOAJ: 0.7500 mg | SUBCUTANEOUS | 0 refills | Status: DC

## 2019-06-20 NOTE — Progress Notes (Signed)
S:     Chief Complaint  Patient presents with  . Medication Management    Diabetes    Patient arrives well appearing, no apparent distress, ambulating without assistance.  Presents for diabetes evaluation, education, and management. Visit was conducted with assistance of Spanish video interpretor.  Patient was referred and last seen by Primary Care Provider, Dr.Olson, on 03/03/2019.   Insurance coverage/medication affordability: Patient reports no insurance. Patient reports she has an orange card.  She reports she is not a resident of the [place].   Patient endorses medication assistance.  Current diabetes medications include: glipizide 5mg  daily, metformin 850mg  three times daily Current hypertension medications include: amlodipine 10mg  daily  Current hyperlipidemia medications include: simvastatin 40mg  daily   Patient denies hypoglycemic events.  Patient reported dietary habits: Eats three meals/day with snacks between meals  Breakfast: fruits, meats and vegetables Drinks: water, soda  Patient reports minimal meat consumption. She endorses that she eats bread (mostly wheat bread, sometimes sweet bread) and tortillas (usually three times a day) and knows this impacts her blood glucose.  - fruits, yogurt, jello, strawberries  - Beans, rice, tortillias   Patient-reported exercise habits: walks at home in the morning    O:  Physical Exam Vitals reviewed.  Constitutional:      Appearance: She is normal weight.  Pulmonary:     Effort: Pulmonary effort is normal.  Neurological:     Mental Status: She is alert.  Psychiatric:        Thought Content: Thought content normal.        Judgment: Judgment normal.      Review of Systems  All other systems reviewed and are negative.    Lab Results  Component Value Date   HGBA1C 8.8 (A) 03/03/2019   Vitals:   06/20/19 1021  BP: 122/78  Pulse: 78  SpO2: 98%    Lipid Panel     Component Value Date/Time   CHOL 139  08/23/2018 0859   TRIG 132 08/23/2018 0859   HDL 40 08/23/2018 0859   CHOLHDL 3.5 08/23/2018 0859   CHOLHDL 7.8 (H) 04/07/2016 1104   VLDL NOT CALC 04/07/2016 1104   LDLCALC 73 08/23/2018 0859    Home fasting blood sugars: 118-190 Home blood pressure: 101-116 / 59-66 (averages 100s/60s) Home HR: 70s  The 10-year ASCVD risk score Mikey Bussing DC Jr., et al., 2013) is: 7.9%   Values used to calculate the score:     Age: 64 years     Sex: Female     Is Non-Hispanic African American: No     Diabetic: Yes     Tobacco smoker: No     Systolic Blood Pressure: 706 mmHg     Is BP treated: Yes     HDL Cholesterol: 40 mg/dL     Total Cholesterol: 139 mg/dL  A/P: Diabetes longstanding currently uncontrolled.  Medication adherence appears good. Control is suboptimal due to suboptimal diet, minimal exercise and language barriers. -Started GLP-1 Trulicity (generic name dulaglutide) 0.75mg  SQ weekly. Counseled patient on side effects, including nausea and vomiting. Instructed patient to call us if vomiting happens. Discussed minimal weight loss with patient. Patient concerned about weight loss. Explained that weight loss is minimal with starting dose, we will monitor weight and make adjustments as necessary.  [facility]. Then [employer] PAP - forms provided.  -Extensively discussed pathophysiology of diabetes, recommended lifestyle interventions, dietary effects on blood sugar control -Counseled on s/sx of and  management of hypoglycemia. Patient expressed concern about low blood sugars.  - Instructed patient to stop glipizide 5 mg daily if she has a BG < 100 mg/dL. -Next A1C anticipated today, 06/20/2019.   ASCVD risk - primary prevention in patient with diabetes. Last LDL is controlled. ASCVD risk score is not >20%  - moderate intensity statin indicated. Aspirin is indicated.  -Continued simvastatin 40 mg daily. Patient denies reports of myalgia. Lipid panel ordered  and pending. - Drug-drug interaction between simvastatin and amlodipine.  Plan to switch to atorvastatin 20 mg if LDL < 70 or atorvastatin 40 mg if LDL > 100.    Written patient instructions provided.  Total time in face to face counseling 60 minutes.   Follow up Pharmacist via telephone phone call in 1 week.   Patient seen with Sula Soda, PharmD Candidate and Gerrit Halls, PharmD PGY-1 Resident.

## 2019-06-20 NOTE — Assessment & Plan Note (Signed)
ASCVD risk - primary prevention in patient with diabetes. Last LDL is controlled. ASCVD risk score is not >20%  - moderate intensity statin indicated. Aspirin is indicated.  -Continued simvastatin 40 mg daily. Patient denies reports of myalgia. Lipid panel ordered and pending. - Drug-drug interaction between simvastatin and amlodipine. Plan to switch to atorvastatin 20 mg if LDL < 70 or atorvastatin 40 mg if LDL > 100.

## 2019-06-20 NOTE — Assessment & Plan Note (Signed)
Diabetes longstanding currently uncontrolled.  Medication adherence appears good. Control is suboptimal due to suboptimal diet, minimal exercise and language barriers. -Started GLP-1 Trulicity (generic name dulaglutide) 0.75mg  SQ weekly. Counseled patient on side effects, including nausea and vomiting. Instructed patient to call us if vomiting happens. Discussed minimal weight loss with patient. Patient concerned about weight loss. Explained that weight loss is minimal with starting dose, we will monitor weight and make adjustments as necessary.  -Extensively discussed pathophysiology of diabetes, recommended lifestyle interventions, dietary effects on blood sugar control -Counseled on s/sx of and management of hypoglycemia. Patient expressed concern about low blood sugars. Instructed patient to stop glipizide 5 mg daily if she saw a BG < 100 mg/dL.

## 2019-06-20 NOTE — Patient Instructions (Addendum)
It was great to meet you today!   Your blood glucose at home has been elevated. We will obtain a new A1C today. Your blood pressure was at goal in the office today.   Follow up plan: - Start Trulicity (generic name delalutin) 0.75mg  subcutaneous injection once weekly.  - This medication may make you nauseous. If you vomit please call [facility] (Dr. Raymondo Band) - Please go to the [facility] (located at [address] [place], [place] [address]) to get your sample of Trulicity and administer it how it was demonstrated to you in the office.  - Complete the paperwork given to you by Tresa Endo, the pharmacy technician, and bring completed paperwork to the [facility]. This paperwork will be used to have your Trulicity shipped to your home.   - Follow up with Dr. Constance Goltz in 2-4 weeks    Fue un placer conocerte hoy!  Su nivel de glucosa en sangre en casa se ha elevado. Obtendremos un nuevo A1C hoy. Tu presin arterial estaba en la meta en la oficina hoy.  Plan de seguimiento: - Inicie la inyeccin subcutnea de 0,75 mg de Trulicity (nombre genrico de delalutina) Neomia Dear vez a la semana. - Este medicamento puede provocarle nuseas. Si vomita, llame a [facility] (Dr. Raymondo Band) - Laurena Bering a la [place] de salud y bienestar de la comunidad [place] en [address] [place], [place] [address]) para obtener su [place] de Trulicity y Freight forwarder se Barrister's clerk en la oficina. - Complete la documentacin que le entreg [place], el tcnico de [place], y [employer] la documentacin completa a la [place] de salud y Health visitor de la comunidad. Esta documentacin se utilizar para que le enven su Trulicity a su hogar. - Seguimiento con el Dr. Constance Goltz en 2-4 semanas

## 2019-06-21 LAB — BASIC METABOLIC PANEL WITH GFR
BUN/Creatinine Ratio: 24 (ref 12–28)
BUN: 16 mg/dL (ref 8–27)
CO2: 22 mmol/L (ref 20–29)
Calcium: 9.7 mg/dL (ref 8.7–10.3)
Chloride: 101 mmol/L (ref 96–106)
Creatinine, Ser: 0.66 mg/dL (ref 0.57–1.00)
GFR calc Af Amer: 110 mL/min/1.73
GFR calc non Af Amer: 96 mL/min/1.73
Glucose: 124 mg/dL — ABNORMAL HIGH (ref 65–99)
Potassium: 4.3 mmol/L (ref 3.5–5.2)
Sodium: 139 mmol/L (ref 134–144)

## 2019-06-21 LAB — POCT GLYCOSYLATED HEMOGLOBIN (HGB A1C): HbA1c, POC (controlled diabetic range): 7.7 % — AB (ref 0.0–7.0)

## 2019-06-21 LAB — TSH: TSH: 3.14 u[IU]/mL (ref 0.450–4.500)

## 2019-06-21 NOTE — Progress Notes (Signed)
Reviewed: I agree with the documentation and management of Dr. Koval. 

## 2019-06-26 ENCOUNTER — Telehealth: Payer: Self-pay

## 2019-06-26 NOTE — Telephone Encounter (Signed)
Called via an interpreter to remind pt that [facility] had her Trulicity filled at no charge, ready for pick up.  Called pt's # twice and voicemail not set up [phone]).  Left a voicemail on daughter's #, 11am 06/26/19.  Call serves as a reminder that pt's meds are ready to be picked up, that we still need the pt's signed portion of the [employer] application given to the pt at her last office visit, and to see if she had any questions.

## 2019-07-21 ENCOUNTER — Other Ambulatory Visit: Payer: Self-pay | Admitting: Family Medicine

## 2019-07-22 NOTE — Telephone Encounter (Signed)
Can we call the pt to ask her why she needs fluconazole? If she is having vaginal complaints she may need to be seen for evaluation.  Ask her if she would prefer a female provider bc it is likely we will need to do a wet prep.

## 2019-07-24 NOTE — Telephone Encounter (Signed)
Attempted to call patient using [employer]. Patient did not answer and VM not set up.   To PCP  Veronda Prude, RN

## 2019-07-28 ENCOUNTER — Other Ambulatory Visit: Payer: Self-pay | Admitting: Family Medicine

## 2019-08-11 ENCOUNTER — Other Ambulatory Visit: Payer: Self-pay | Admitting: Family Medicine

## 2019-08-11 DIAGNOSIS — E039 Hypothyroidism, unspecified: Secondary | ICD-10-CM

## 2019-09-06 ENCOUNTER — Other Ambulatory Visit: Payer: Self-pay | Admitting: Family Medicine

## 2019-09-06 DIAGNOSIS — E782 Mixed hyperlipidemia: Secondary | ICD-10-CM

## 2019-11-02 ENCOUNTER — Other Ambulatory Visit: Payer: Self-pay | Admitting: Family Medicine

## 2019-11-05 ENCOUNTER — Other Ambulatory Visit: Payer: Self-pay | Admitting: Family Medicine

## 2019-11-05 DIAGNOSIS — E782 Mixed hyperlipidemia: Secondary | ICD-10-CM

## 2019-11-22 ENCOUNTER — Other Ambulatory Visit: Payer: Self-pay | Admitting: Family Medicine

## 2020-01-12 ENCOUNTER — Emergency Department (HOSPITAL_COMMUNITY)
Admission: EM | Admit: 2020-01-12 | Discharge: 2020-01-12 | Disposition: A | Discharge status: Home / Self Care | Payer: Self-pay | Attending: Emergency Medicine | Admitting: Emergency Medicine

## 2020-01-12 DIAGNOSIS — E1142 Type 2 diabetes mellitus with diabetic polyneuropathy: Secondary | ICD-10-CM | POA: Insufficient documentation

## 2020-01-12 DIAGNOSIS — Z87891 Personal history of nicotine dependence: Secondary | ICD-10-CM | POA: Insufficient documentation

## 2020-01-12 DIAGNOSIS — I10 Essential (primary) hypertension: Secondary | ICD-10-CM | POA: Insufficient documentation

## 2020-01-12 DIAGNOSIS — Z7984 Long term (current) use of oral hypoglycemic drugs: Secondary | ICD-10-CM | POA: Insufficient documentation

## 2020-01-12 DIAGNOSIS — E039 Hypothyroidism, unspecified: Secondary | ICD-10-CM | POA: Insufficient documentation

## 2020-01-12 DIAGNOSIS — N390 Urinary tract infection, site not specified: Secondary | ICD-10-CM | POA: Insufficient documentation

## 2020-01-12 DIAGNOSIS — Z79899 Other long term (current) drug therapy: Secondary | ICD-10-CM | POA: Insufficient documentation

## 2020-01-12 LAB — URINALYSIS, ROUTINE W REFLEX MICROSCOPIC
Bilirubin Urine: NEGATIVE
Glucose, UA: NEGATIVE mg/dL
Ketones, ur: NEGATIVE mg/dL
Leukocytes,Ua: NEGATIVE
Nitrite: POSITIVE — AB
Protein, ur: NEGATIVE mg/dL
Specific Gravity, Urine: 1.003 — ABNORMAL LOW (ref 1.005–1.030)
pH: 6 (ref 5.0–8.0)

## 2020-01-12 LAB — BASIC METABOLIC PANEL
Anion gap: 14 (ref 5–15)
BUN: 17 mg/dL (ref 8–23)
CO2: 22 mmol/L (ref 22–32)
Calcium: 9.4 mg/dL (ref 8.9–10.3)
Chloride: 101 mmol/L (ref 98–111)
Creatinine, Ser: 0.84 mg/dL (ref 0.44–1.00)
GFR, Estimated: >60 mL/min (ref >60)
Glucose, Bld: 89 mg/dL (ref 70–99)
Potassium: 4 mmol/L (ref 3.5–5.1)
Sodium: 137 mmol/L (ref 135–145)

## 2020-01-12 LAB — CBC
HCT: 40 % (ref 36.0–46.0)
Hemoglobin: 13.1 g/dL (ref 12.0–15.0)
MCH: 31.4 pg (ref 26.0–34.0)
MCHC: 32.8 g/dL (ref 30.0–36.0)
MCV: 95.9 fL (ref 80.0–100.0)
Platelets: 343 10*3/uL (ref 150–400)
RBC: 4.17 MIL/uL (ref 3.87–5.11)
RDW: 11.9 % (ref 11.5–15.5)
WBC: 10.7 10*3/uL — ABNORMAL HIGH (ref 4.0–10.5)
nRBC: 0 % (ref 0.0–0.2)

## 2020-01-12 MED ORDER — SULFAMETHOXAZOLE-TRIMETHOPRIM 800-160 MG PO TABS: 1.0000 | ORAL_TABLET | Freq: Two times a day (BID) | ORAL | 0 refills | Status: AC

## 2020-01-12 MED ORDER — SULFAMETHOXAZOLE-TRIMETHOPRIM 800-160 MG PO TABS
1.0000 | ORAL_TABLET | Freq: Once | ORAL | Status: AC
  Administered 2020-01-12: 1 via ORAL
  Filled 2020-01-12: qty 1

## 2020-01-12 NOTE — ED Triage Notes (Signed)
Pt arrived via POV for cc of hypertension, headache. This is an ongoing issue. Pt reports she is also having decreased urine output and painful urination.

## 2020-01-12 NOTE — ED Provider Notes (Signed)
[name] DEPART[facility] Provider Note   CSN: [id number] Arrival date & time: 01/12/20  1031     History Chief Complaint  Patient presents with  . Hypertension  . Headache    Brittany Pineda is a 64 y.o. female.  HPI Patient presents with concern of dysuria.  After initially voicing with concern.  The patient notes that she also has bilateral heaviness in her feet, and headaches when she has blood pressure elevation. She has seemingly been taking all medication as directed including antihypertensives, Metformin, and cholesterol meds. However, she notes that with some consideration of her bilateral leg cramping, she stopped her statin three days ago.  Currently she denies HA, CP, abd pain.  She does have "pressure" sensation in her suprapubic region.     Past Medical History:  Diagnosis Date  . Diabetes mellitus   . Hyperlipidemia   . Hypertension   . Thyroid disease     Patient Active Problem List   Diagnosis Date Noted  . Health care maintenance 10/27/2018  . Need for financial support 10/27/2018  . Heart palpitations 10/26/2018  . Anxiety 08/23/2018  . Diabetic polyneuropathy associated with type 2 diabetes mellitus ([place]) 02/23/2017  . Essential hypertension 10/22/2016  . Onychomycosis 04/07/2016  . Depression 04/03/2014  . Hypothyroidism 03/13/2014  . Hyperlipidemia 07/22/2011  . Poorly controlled type 2 diabetes mellitus ([place]) 07/22/2011    Past Surgical History:  Procedure Laterality Date  . ABDOMINAL HYSTERECTOMY    . CHOLECYSTECTOMY       OB History   No obstetric history on file.     Family History  Problem Relation Age of Onset  . Alcohol abuse Father   . Hyperlipidemia Sister   . Depression Brother     Social History   Tobacco Use  . Smoking status: Former Research scientist (life sciences)  . Smokeless tobacco: Never Used  . Tobacco comment: Stopped in 1996  Vaping Use  . Vaping Use: Never used  Substance Use Topics    . Alcohol use: No    Alcohol/week: 0.0 standard drinks  . Drug use: No    Home Medications Prior to Admission medications   Medication Sig Start Date End Date Taking? Authorizing Provider  acetaminophen (TYLENOL) 500 MG tablet Take 500 mg by mouth every 6 (six) hours as needed for mild pain or headache.    [provider]  amLODipine (NORVASC) 10 MG tablet TAKE 1 TABLET BY MOUTH AT BEDTIME 11/03/19   Benay Pike, MD  Blood Glucose Monitoring Suppl (AGAMATRIX PRESTO) w/Device KIT Use as instructed for PrestoWaveSense meter.  Dispense QS for testing up to thrice daily.  Dx E11.9 04/07/16   Kinnie Feil, MD  dicyclomine (BENTYL) 20 MG tablet Take 1 tablet (20 mg total) by mouth every 8 (eight) hours as needed for spasms (abdominal cramps). Patient not taking: Reported on 08/29/2018 08/20/18   Petrucelli, Samantha R, PA-C  Dulaglutide (TRULICITY) 2.35 TI/1.4ER SOPN Inject 0.75 mg into the skin once a week. 06/20/19   Leavy Cella, RPH-CPP  EUTHYROX 75 MCG tablet TAKE 1 TABLET BY MOUTH ONCE DAILY BEFORE BREAKFAST 08/15/19   Benay Pike, MD  fluconazole (DIFLUCAN) 150 MG tablet Take 1 tablet now and then a second tablet in 72 hrs. 09/16/18   Caroline More, DO  gabapentin (NEURONTIN) 100 MG capsule Take 1 capsule (100 mg total) by mouth at bedtime. Patient not taking: Reported on 05/31/2018 03/09/17   Glenis Smoker, MD  Garlic 1540 MG  CAPS Take 1,000 mg by mouth daily. Take 2-3 capsules per day    [provider]  glipiZIDE (GLUCOTROL) 5 MG tablet Take 1 tablet (5 mg total) by mouth daily. 03/03/19   Benay Pike, MD  glucose blood test strip Use as instructed for PrestoWaveSense meter.  Dispense QS for testing up to thrice daily.  Dx E11.9 09/11/16   Glenis Smoker, MD  Lancets MISC Dispense quantity sufficient for thrice daily testing. 09/11/16   Glenis Smoker, MD  metFORMIN (GLUCOPHAGE) 850 MG tablet TAKE 1 TABLET BY MOUTH TWICE DAILY WITH MEALS  11/23/19   Benay Pike, MD  omeprazole (PRILOSEC) 20 MG capsule Take 1 capsule (20 mg total) by mouth daily. Patient not taking: Reported on 06/20/2019 08/20/18   Petrucelli, Samantha R, PA-C  RELION SHORT PEN NEEDLES 31G X 8 MM MISC USE AS DIRECTED ONCE DAILY 07/09/15   Rosemarie Ax, MD  simvastatin (ZOCOR) 40 MG tablet TAKE 1 TABLET BY MOUTH AT BEDTIME 11/07/19   Benay Pike, MD  sucralfate (CARAFATE) 1 GM/10ML suspension Take 10 mLs (1 g total) by mouth 4 (four) times daily -  with meals and at bedtime. Patient not taking: Reported on 08/29/2018 08/20/18   Petrucelli, Glynda Jaeger, PA-C    Allergies    Patient has no known allergies.  Review of Systems   Review of Systems  Constitutional:       Per HPI, otherwise negative  HENT:       Per HPI, otherwise negative  Respiratory:       Per HPI, otherwise negative  Cardiovascular:       Per HPI, otherwise negative  Gastrointestinal: Negative for vomiting.  Endocrine:       Negative aside from HPI  Genitourinary:       Neg aside from HPI   Musculoskeletal:       Per HPI, otherwise negative  Skin: Negative.   Neurological: Positive for headaches. Negative for syncope.    Physical Exam Updated Vital Signs BP (!) 104/58 (BP Location: Right Arm)   Pulse 77   Temp 98.1 F (36.7 C) (Oral)   Resp 16   SpO2 95%   Physical Exam Vitals and nursing note reviewed.  Constitutional:      General: She is not in acute distress.    Appearance: She is well-developed.  HENT:     Head: Normocephalic and atraumatic.  Eyes:     Conjunctiva/sclera: Conjunctivae normal.  Cardiovascular:     Rate and Rhythm: Normal rate and regular rhythm.  Pulmonary:     Effort: Pulmonary effort is normal. No respiratory distress.     Breath sounds: Normal breath sounds. No stridor.  Abdominal:     General: There is no distension.     Tenderness: There is abdominal tenderness in the suprapubic area.  Skin:    General: Skin is warm and dry.    Neurological:     Mental Status: She is alert and oriented to person, place, and time.     Cranial Nerves: No cranial nerve deficit.     ED Results / Procedures / Treatments   Labs (all labs ordered are listed, but only abnormal results are displayed) Labs Reviewed  URINALYSIS, ROUTINE W REFLEX MICROSCOPIC - Abnormal; Notable for the following components:      Result Value   Color, Urine AMBER (*)    Specific Gravity, Urine 1.003 (*)    Hgb urine dipstick SMALL (*)  Nitrite POSITIVE (*)    Bacteria, UA MANY (*)    All other components within normal limits  CBC - Abnormal; Notable for the following components:   WBC 10.7 (*)    All other components within normal limits  BASIC METABOLIC PANEL    Procedures Procedures (including critical care time)  Medications Ordered in ED Medications  sulfamethoxazole-trimethoprim (BACTRIM DS) 800-160 MG per tablet 1 tablet (has no administration in time range)    ED Course  I have reviewed the triage vital signs and the nursing notes.  Pertinent labs & imaging results that were available during my care of the patient were reviewed by me and considered in my medical decision making (see chart for details).    MDM Rules/Calculators/A&P                          3:04 PM On repeat exam the patient is calm, in no distress. Vital signs remain unremarkable. We discussed findings, including reassuring chemistry panel, no renal dysfunction.  No leukocytosis, no fever, and without substantial tenderness beyond her suprapubic region, no evidence for peritonitis, low suspicion for acute abdominal processes.  However, the patient is found to have nitrite positive urine, given her suprapubic pain, sense of urgency, patient will start therapy for urinary tract infection per Patient appropriate for, amenable to outpatient follow-up, which notes that she has in 3 days. Final Clinical Impression(s) / ED Diagnoses Final diagnoses:  Lower urinary  tract infection, acute    Rx / DC Orders ED Discharge Orders         Ordered    sulfamethoxazole-trimethoprim (BACTRIM DS) 800-160 MG tablet  2 times daily        01/12/20 1508           Carmin Muskrat, MD 01/12/20 [phone]

## 2020-01-12 NOTE — ED Notes (Signed)
Discharged done via Dr Jeraldine Loots

## 2020-01-12 NOTE — Discharge Instructions (Signed)
As discussed, your evaluation today has been largely reassuring.  But, it is important that you monitor your condition carefully, and do not hesitate to return to the ED if you develop new, or concerning changes in your condition. ? ?Otherwise, please follow-up with your physician for appropriate ongoing care. ? ?

## 2020-01-18 ENCOUNTER — Ambulatory Visit: Payer: Self-pay | Admitting: Family Medicine

## 2020-01-24 ENCOUNTER — Other Ambulatory Visit: Payer: Self-pay

## 2020-01-24 ENCOUNTER — Encounter: Payer: Self-pay | Admitting: Family Medicine

## 2020-01-24 ENCOUNTER — Ambulatory Visit (INDEPENDENT_AMBULATORY_CARE_PROVIDER_SITE_OTHER): Payer: Self-pay | Admitting: Family Medicine

## 2020-01-24 VITALS — BP 98/58 | HR 80 | Ht 63.0 in | Wt 141.4 lb

## 2020-01-24 DIAGNOSIS — E119 Type 2 diabetes mellitus without complications: Secondary | ICD-10-CM

## 2020-01-24 DIAGNOSIS — M7989 Other specified soft tissue disorders: Secondary | ICD-10-CM

## 2020-01-24 DIAGNOSIS — I10 Essential (primary) hypertension: Secondary | ICD-10-CM

## 2020-01-24 DIAGNOSIS — R300 Dysuria: Secondary | ICD-10-CM

## 2020-01-24 DIAGNOSIS — E039 Hypothyroidism, unspecified: Secondary | ICD-10-CM

## 2020-01-24 DIAGNOSIS — E782 Mixed hyperlipidemia: Secondary | ICD-10-CM

## 2020-01-24 LAB — POCT GLYCOSYLATED HEMOGLOBIN (HGB A1C): Hemoglobin A1C: 6.7 % — AB (ref 4.0–5.6)

## 2020-01-24 LAB — POCT URINALYSIS DIP (MANUAL ENTRY)
Bilirubin, UA: NEGATIVE
Glucose, UA: 250 mg/dL — AB
Nitrite, UA: NEGATIVE
Protein Ur, POC: NEGATIVE mg/dL
Spec Grav, UA: 1.025 (ref 1.010–1.025)
Urobilinogen, UA: 0.2 E.U./dL
pH, UA: 5.5 (ref 5.0–8.0)

## 2020-01-24 LAB — POCT UA - MICROSCOPIC ONLY

## 2020-01-24 MED ORDER — METFORMIN HCL 850 MG PO TABS: 850.0000 mg | ORAL_TABLET | Freq: Three times a day (TID) | ORAL | 11 refills | Status: DC

## 2020-01-24 MED ORDER — GLIPIZIDE 5 MG PO TABS: 5.0000 mg | ORAL_TABLET | Freq: Every day | ORAL | 3 refills | Status: DC

## 2020-01-24 MED ORDER — LEVOTHYROXINE SODIUM 75 MCG PO TABS
ORAL_TABLET | ORAL | 3 refills | Status: DC
Start: 2020-01-24 — End: 2021-01-15

## 2020-01-24 MED ORDER — SIMVASTATIN 40 MG PO TABS: 40.0000 mg | ORAL_TABLET | Freq: Every day | ORAL | 3 refills | Status: DC

## 2020-01-24 MED ORDER — AMLODIPINE BESYLATE 10 MG PO TABS
10.0000 mg | ORAL_TABLET | Freq: Every day | ORAL | 3 refills | Status: DC
Start: 2020-01-24 — End: 2021-01-29

## 2020-01-24 NOTE — Progress Notes (Signed)
    SUBJECTIVE:   CHIEF COMPLAINT / HPI:   Leg swelling: Patient states she has leg swelling bilaterally over the past 2 weeks.  She states it is painful.  It gets worse the more she is on her feet.  She works all day standing.  She does not elevate her legs at night.  She does not have any dyspnea, PND, orthopnea.  The swelling started before her UTI and visit to the ED.  DM: Patient is taking her Metformin 3 times a day and taking glipizide.  She is never taken the Trulicity.  HTN: Patient taking her amlodipine, but wonders if she should take it more often or increase the dose.  She states her blood pressure has been high recently since her UTI.  Hypothyroidism: Patient is taking her Euthyrox daily.  No side effects.  HLD: Patient wonders if she needs to keep taking the cholesterol medication.  She says she gets upset stomach sometimes when she eats foods that do not agree with her.  She thinks it may be due to this medication.  She denies muscle pain.  UTI symptoms resolved.   PERTINENT  PMH / PSH: DM 2, HLD, HTN,  OBJECTIVE:   BP (!) 98/58   Pulse 80   Ht 5\' 3"  (1.6 m)   Wt 141 lb 6 oz (64.1 kg)   SpO2 97%   BMI 25.04 kg/m   General: Alert, oriented.  No acute distress CV: Regular rate and rhythm, no murmurs Pulmonary: Lungs clear auscultation bilaterally Extremities: No swelling appreciated in the legs bilaterally.  Nontender to palpation. Psych: Patient appears less anxious/depressed today than in previous visits.  Better eye contact, more talkative.  ASSESSMENT/PLAN:   Controlled type 2 diabetes mellitus without complication, without long-term current use of insulin (HCC) Well controlled.  Pt not complaining of hypoglycemic episodes.  Pt not taking the trulicity that was prescribed during her pharmacist visit.   - continue current medications.    Leg swelling Description and exam consistent with venous insufficiency.  Recent bmp shows normal kidney function. Exam  shows no swelling today.  Discussed otc compression stockings that she can pick up at pharmacy.    Hypothyroidism Compliant with medication.  Will get tsh today.    Essential hypertension Pt mentions high bp, but previously recorded bp's have all been low normal in contrast to patient's self reported high values. Pt has anxiety surrounding her bp and glucose levels so I reassured pt that her bp was great and she doesn't need to change any medications today.  - continue amlodipine.    Hyperlipidemia Discussed with patient regarding the reasons for taking statins.  She does not have any myopathy with this medication.   - continue statin - check ldl today     , MD [facility]

## 2020-01-24 NOTE — Patient Instructions (Addendum)
It was nice to see you today,  I have refilled all your medications.  I sent him to [facility].  You can show the picture below to the employees at the [facility] pharmacy if you need help finding the compression stockings.  You should wear the compression stockings when you are on your feet while working all day.  Try to elevate your feet at night when you are resting.  Have a great day,  Frederic Jericho, MD

## 2020-01-25 DIAGNOSIS — M7989 Other specified soft tissue disorders: Secondary | ICD-10-CM | POA: Insufficient documentation

## 2020-01-25 LAB — TSH: TSH: 1.81 u[IU]/mL (ref 0.450–4.500)

## 2020-01-25 LAB — LDL CHOLESTEROL, DIRECT: LDL Direct: 73 mg/dL (ref 0–99)

## 2020-01-25 NOTE — Assessment & Plan Note (Signed)
Compliant with medication.  Will get tsh today.

## 2020-01-25 NOTE — Assessment & Plan Note (Signed)
Description and exam consistent with venous insufficiency.  Recent bmp shows normal kidney function. Exam shows no swelling today.  Discussed otc compression stockings that she can pick up at pharmacy.

## 2020-01-25 NOTE — Assessment & Plan Note (Signed)
Well controlled.  Pt not complaining of hypoglycemic episodes.  Pt not taking the trulicity that was prescribed during her pharmacist visit.   - continue current medications.

## 2020-01-25 NOTE — Assessment & Plan Note (Signed)
Pt mentions high bp, but previously recorded bp's have all been low normal in contrast to patient's self reported high values. Pt has anxiety surrounding her bp and glucose levels so I reassured pt that her bp was great and she doesn't need to change any medications today.  - continue amlodipine.

## 2020-01-25 NOTE — Assessment & Plan Note (Addendum)
Discussed with patient regarding the reasons for taking statins.  She does not have any myopathy with this medication.   - continue statin - check ldl today

## 2020-10-06 ENCOUNTER — Encounter (HOSPITAL_COMMUNITY): Payer: Self-pay

## 2020-10-06 ENCOUNTER — Ambulatory Visit (HOSPITAL_COMMUNITY)
Admission: EM | Admit: 2020-10-06 | Discharge: 2020-10-06 | Disposition: A | Discharge status: Home / Self Care | Payer: Self-pay | Attending: Student | Admitting: Student

## 2020-10-06 DIAGNOSIS — Z789 Other specified health status: Secondary | ICD-10-CM

## 2020-10-06 DIAGNOSIS — I10 Essential (primary) hypertension: Secondary | ICD-10-CM

## 2020-10-06 DIAGNOSIS — N3001 Acute cystitis with hematuria: Secondary | ICD-10-CM

## 2020-10-06 LAB — POCT URINALYSIS DIPSTICK, ED / UC
Bilirubin Urine: NEGATIVE
Glucose, UA: NEGATIVE mg/dL
Ketones, ur: NEGATIVE mg/dL
Nitrite: NEGATIVE
Protein, ur: NEGATIVE mg/dL
Specific Gravity, Urine: <=1.005 (ref 1.005–1.030)
Urobilinogen, UA: 0.2 mg/dL (ref 0.0–1.0)
pH: 5.5 (ref 5.0–8.0)

## 2020-10-06 MED ORDER — CEPHALEXIN 500 MG PO CAPS: 500.0000 mg | ORAL_CAPSULE | Freq: Four times a day (QID) | ORAL | 0 refills | Status: DC

## 2020-10-06 NOTE — ED Triage Notes (Signed)
Pt presents with lower abdominal pain and dysuria x 1 week. Pt states she has also been having HBP.

## 2020-10-06 NOTE — Discharge Instructions (Addendum)
-  Start the antibiotic: Keflex, 4x daily x5 days. You can take this with food if you have a sensitive stomach. -Please check your blood pressure at home or at the pharmacy. If this continues to be >140/90, follow-up with your primary care provider for further blood pressure management/ medication titration. If you develop chest pain, shortness of breath, vision changes, the worst headache of your life- head straight to the ED or call 911.

## 2020-10-06 NOTE — ED Provider Notes (Signed)
[name]    CSN: [id number] Arrival date & time: 10/06/20  1522      History   Chief Complaint Chief Complaint  Patient presents with   Abdominal Pain   Dysuria   Hypertension    HPI Brittany Pineda is a 65 y.o. female presenting with UTI symptoms x7 days. History diabetes, cholecystectomy. Last UTI 01/2020. Notes suprapubic pressure and dysuria, consistent with past UTI symptoms. Denies hematuria, frequency, urgency, back pain, n/v/d, fevers/chills, abdnormal vaginal discharge. Also with BP running 120-150/100 at home, states this is higher than normal.  She is still taking her amlodipine as directed by primary care provider.  Denies headaches, dizziness, vision changes, weakness, chest pain, shortness of breath.   HPI  Past Medical History:  Diagnosis Date   Diabetes mellitus    Hyperlipidemia    Hypertension    Thyroid disease     Patient Active Problem List   Diagnosis Date Noted   Leg swelling 01/25/2020   Health care maintenance 10/27/2018   Need for financial support 10/27/2018   Heart palpitations 10/26/2018   Anxiety 08/23/2018   Diabetic polyneuropathy associated with type 2 diabetes mellitus ([place]) 02/23/2017   Essential hypertension 10/22/2016   Onychomycosis 04/07/2016   Depression 04/03/2014   Hypothyroidism 03/13/2014   Hyperlipidemia 07/22/2011   Controlled type 2 diabetes mellitus without complication, without long-term current use of insulin ([place]) 07/22/2011    Past Surgical History:  Procedure Laterality Date   ABDOMINAL HYSTERECTOMY     CHOLECYSTECTOMY      OB History   No obstetric history on file.      Home Medications    Prior to Admission medications   Medication Sig Start Date End Date Taking? Authorizing Provider  cephALEXin (KEFLEX) 500 MG capsule Take 1 capsule (500 mg total) by mouth 4 (four) times daily. 10/06/20  Yes Hazel Sams, PA-C  acetaminophen (TYLENOL) 500 MG tablet Take 500 mg by  mouth every 6 (six) hours as needed for mild pain or headache.    [provider]  amLODipine (NORVASC) 10 MG tablet Take 1 tablet (10 mg total) by mouth at bedtime. 01/24/20   Benay Pike, MD  Blood Glucose Monitoring Suppl [facility]) w/Device KIT Use as instructed for PrestoWaveSense meter.  Dispense QS for testing up to thrice daily.  Dx E11.9 04/07/16   Kinnie Feil, MD  dicyclomine (BENTYL) 20 MG tablet Take 1 tablet (20 mg total) by mouth every 8 (eight) hours as needed for spasms (abdominal cramps). Patient not taking: Reported on 08/29/2018 08/20/18   Petrucelli, Samantha R, PA-C  Dulaglutide (TRULICITY) 5.32 DJ/2.4QA SOPN Inject 0.75 mg into the skin once a week. 06/20/19   Leavy Cella, RPH-CPP  fluconazole (DIFLUCAN) 150 MG tablet Take 1 tablet now and then a second tablet in 72 hrs. 09/16/18   Caroline More, DO  gabapentin (NEURONTIN) 100 MG capsule Take 1 capsule (100 mg total) by mouth at bedtime. Patient not taking: Reported on 05/31/2018 03/09/17   Glenis Smoker, MD  Garlic 8341 MG CAPS Take 1,000 mg by mouth daily. Take 2-3 capsules per day    [provider]  glipiZIDE (GLUCOTROL) 5 MG tablet Take 1 tablet (5 mg total) by mouth daily. 01/24/20   Benay Pike, MD  glucose blood test strip Use as instructed for PrestoWaveSense meter.  Dispense QS for testing up to thrice daily.  Dx E11.9 09/11/16   Glenis Smoker, MD  Lancets MISC Dispense  quantity sufficient for thrice daily testing. 09/11/16   Glenis Smoker, MD  levothyroxine (EUTHYROX) 75 MCG tablet TAKE 1 TABLET BY MOUTH ONCE DAILY BEFORE BREAKFAST 01/24/20   Benay Pike, MD  metFORMIN (GLUCOPHAGE) 850 MG tablet Take 1 tablet (850 mg total) by mouth with breakfast, with lunch, and with evening meal. 01/24/20 01/18/21  Benay Pike, MD  omeprazole (PRILOSEC) 20 MG capsule Take 1 capsule (20 mg total) by mouth daily. Patient not taking: Reported on 06/20/2019 08/20/18    Petrucelli, Samantha R, PA-C  RELION SHORT PEN NEEDLES 31G X 8 MM MISC USE AS DIRECTED ONCE DAILY 07/09/15   Rosemarie Ax, MD  simvastatin (ZOCOR) 40 MG tablet Take 1 tablet (40 mg total) by mouth at bedtime. 01/24/20   Benay Pike, MD  sucralfate (CARAFATE) 1 GM/10ML suspension Take 10 mLs (1 g total) by mouth 4 (four) times daily -  with meals and at bedtime. Patient not taking: Reported on 08/29/2018 08/20/18   Petrucelli, Glynda Jaeger, PA-C    Family History Family History  Problem Relation Age of Onset   Alcohol abuse Father    Hyperlipidemia Sister    Depression Brother     Social History Social History   Tobacco Use   Smoking status: Former   Smokeless tobacco: Never   Tobacco comments:    Stopped in 1996  Vaping Use   Vaping Use: Never used  Substance Use Topics   Alcohol use: No    Alcohol/week: 0.0 standard drinks   Drug use: No     Allergies   Patient has no known allergies.   Review of Systems Review of Systems  Constitutional:  Negative for appetite change, chills, diaphoresis and fever.  Respiratory:  Negative for shortness of breath.   Cardiovascular:  Negative for chest pain.  Gastrointestinal:  Positive for abdominal pain. Negative for blood in stool, constipation, diarrhea, nausea and vomiting.  Genitourinary:  Positive for dysuria. Negative for decreased urine volume, difficulty urinating, flank pain, frequency, genital sores, hematuria and urgency.  Musculoskeletal:  Negative for back pain.  Neurological:  Negative for dizziness, weakness and light-headedness.  All other systems reviewed and are negative.   Physical Exam Triage Vital Signs ED Triage Vitals  Enc Vitals Group     BP      Pulse      Resp      Temp      Temp src      SpO2      Weight      Height      Head Circumference      Peak Flow      Pain Score      Pain Loc      Pain Edu?      Excl. in [place]?    No data found.  Updated Vital Signs BP (!) 150/77   Pulse 77    Temp 98.2 F (36.8 C) (Oral)   Resp 19   SpO2 98%   Visual Acuity Right Eye Distance:   Left Eye Distance:   Bilateral Distance:    Right Eye Near:   Left Eye Near:    Bilateral Near:     Physical Exam Vitals reviewed.  Constitutional:      General: She is not in acute distress.    Appearance: Normal appearance. She is not ill-appearing.  HENT:     Head: Normocephalic and atraumatic.     Mouth/Throat:     Mouth: Mucous membranes  are moist.     Comments: Moist mucous membranes Eyes:     Extraocular Movements: Extraocular movements intact.     Pupils: Pupils are equal, round, and reactive to light.  Cardiovascular:     Rate and Rhythm: Normal rate and regular rhythm.     Heart sounds: Normal heart sounds.  Pulmonary:     Effort: Pulmonary effort is normal.     Breath sounds: Normal breath sounds. No wheezing, rhonchi or rales.  Abdominal:     General: Bowel sounds are normal. There is no distension.     Palpations: Abdomen is soft. There is no mass.     Tenderness: There is abdominal tenderness in the suprapubic area. There is no right CVA tenderness, left CVA tenderness, guarding or rebound.  Skin:    General: Skin is warm.     Capillary Refill: Capillary refill takes less than 2 seconds.     Comments: Good skin turgor  Neurological:     General: No focal deficit present.     Mental Status: She is alert and oriented to person, place, and time.  Psychiatric:        Mood and Affect: Mood normal.        Behavior: Behavior normal.     UC Treatments / Results  Labs (all labs ordered are listed, but only abnormal results are displayed) Labs Reviewed  POCT URINALYSIS DIPSTICK, ED / UC - Abnormal; Notable for the following components:      Result Value   Hgb urine dipstick SMALL (*)    Leukocytes,Ua SMALL (*)    All other components within normal limits  URINE CULTURE    EKG   Radiology No results found.  Procedures Procedures (including critical care  time)  Medications Ordered in UC Medications - No data to display  Initial Impression / Assessment and Plan / UC Course  I have reviewed the triage vital signs and the nursing notes.  Pertinent labs & imaging results that were available during my care of the patient were reviewed by me and considered in my medical decision making (see chart for details).     This patient is a very pleasant 65 y.o. year old female presenting with acute cystitis.  Suprapubic pressure but no flank pain, afebrile and nontachycardic. last UTI was 01/2020.[place] Kitchen   UA with small blood and small leuk, culture sent.  We will treat for UTI with Keflex as below.  For elevated blood pressure, continue to monitor at home and follow-up with primary care.  ED return precautions discussed. Patient verbalizes understanding and agreement.   Coding Level 4 for review of past notes/labs, order and interpretation of labs today, and prescription drug management  Spoke with this patient using language line.  Final Clinical Impressions(s) / UC Diagnoses   Final diagnoses:  Acute cystitis with hematuria  Language barrier  Essential hypertension     Discharge Instructions      -Start the antibiotic: Keflex, 4x daily x5 days. You can take this with food if you have a sensitive stomach. -Please check your blood pressure at home or at the pharmacy. If this continues to be >140/90, follow-up with your primary care provider for further blood pressure management/ medication titration. If you develop chest pain, shortness of breath, vision changes, the worst headache of your life- head straight to the ED or call 911.      ED Prescriptions     Medication Sig Dispense Auth. Provider   cephALEXin (KEFLEX) 500 MG  capsule Take 1 capsule (500 mg total) by mouth 4 (four) times daily. 20 capsule Hazel Sams, PA-C      PDMP not reviewed this encounter.   Hazel Sams, PA-C 10/06/20 1726

## 2020-10-08 LAB — URINE CULTURE: Culture: >=100000 — AB

## 2021-01-15 ENCOUNTER — Other Ambulatory Visit: Payer: Self-pay | Admitting: Family Medicine

## 2021-01-15 DIAGNOSIS — E039 Hypothyroidism, unspecified: Secondary | ICD-10-CM

## 2021-01-20 ENCOUNTER — Other Ambulatory Visit: Payer: Self-pay

## 2021-01-20 MED ORDER — METFORMIN HCL 850 MG PO TABS: 850.0000 mg | ORAL_TABLET | Freq: Three times a day (TID) | ORAL | 0 refills | Status: DC

## 2021-01-26 ENCOUNTER — Other Ambulatory Visit: Payer: Self-pay | Admitting: Family Medicine

## 2021-01-26 DIAGNOSIS — E782 Mixed hyperlipidemia: Secondary | ICD-10-CM

## 2021-01-29 ENCOUNTER — Other Ambulatory Visit: Payer: Self-pay

## 2021-03-05 NOTE — Progress Notes (Signed)
°  SUBJECTIVE:   CHIEF COMPLAINT / HPI:   T2DM: home medications include metformin 850 mg TID, glipizide 5mg  daily.   HTN: home medications include amlodipine 10mg  qhs. Denies chest pain. She does not exercise but states she walks about 10 hours a day because she cleans.   Hypothyroidism: home medications include synthroid daily.   HLD: home medications include zocor 40mg  qhs.  Requesting refills to all medications above today.  Other: She would like to know if her supplements of Ashwagandha and garlic are okay to continue taking.   PERTINENT  PMH / PSH: HTN, HLD, hypothyroidism, T2DM, anxiety/depression  OBJECTIVE:  BP 130/69    Pulse 81    Ht 5\' 3"  (1.6 m)    Wt 146 lb 8 oz (66.5 kg)    SpO2 98%    BMI 25.95 kg/m    General: NAD, pleasant, able to participate in exam Cardiac: RRR, no murmurs. Respiratory: CTAB, normal effort, No wheezes, rales or rhonchi  ASSESSMENT/PLAN:  Hypothyroidism Compliant with medication, will get TSH today and refilled levothyroxine.  Hyperlipidemia Compliant with medication, will check lipid panel today and refilled Zocor 40 mg.  Essential hypertension Blood pressure 130/69.  Compliant with medications, refilled amlodipine 10 mg today.  Controlled type 2 diabetes mellitus without complication, without long-term current use of insulin (HCC) Compliant with medications, refilled metformin and glipizide, recheck A1c today.  Check BMP today for kidney functioning.  Need for financial support Checkout note placed to give patient [place] financial support packet.  Patient has out of date for several care gaps and will likely need support to have these completed (ophthalmology exam for diabetic eye screening, colonoscopy, mammogram).  Orders Placed This Encounter  Procedures   TSH   Lipid panel    Order Specific Question:   Has the patient fasted?    Answer:   No   Basic Metabolic Panel    Order Specific Question:   Has the patient  fasted?    Answer:   No   POCT HgB A1C   Meds ordered this encounter  Medications   glipiZIDE (GLUCOTROL) 5 MG tablet    Sig: Take 1 tablet (5 mg total) by mouth daily.    Dispense:  30 tablet    Refill:  2   levothyroxine (SYNTHROID) 75 MCG tablet    Sig: TAKE 1 TABLET BY MOUTH ONCE DAILY BEFORE BREAKFAST.    Dispense:  30 tablet    Refill:  2   metFORMIN (GLUCOPHAGE) 850 MG tablet    Sig: Take 1 tablet (850 mg total) by mouth with breakfast, with lunch, and with evening meal. Please make office appointment. 1st attempt    Dispense:  90 tablet    Refill:  2    Pt needs to make an appointment with clinic for further refills.   amLODipine (NORVASC) 10 MG tablet    Sig: Take 1 tablet (10 mg total) by mouth at bedtime.    Dispense:  90 tablet    Refill:  0   simvastatin (ZOCOR) 40 MG tablet    Sig: Take 1 tablet (40 mg total) by mouth at bedtime for 90 doses. Please make clinic appointment for additional refills.  First attempt    Dispense:  30 tablet    Refill:  2   Return in about 1 month (around 04/06/2021) for annual physical.   , DO 03/06/2021, 7:17 PM PGY-1, [facility]

## 2021-03-06 ENCOUNTER — Ambulatory Visit (INDEPENDENT_AMBULATORY_CARE_PROVIDER_SITE_OTHER): Payer: Self-pay | Admitting: Student

## 2021-03-06 ENCOUNTER — Other Ambulatory Visit: Payer: Self-pay

## 2021-03-06 ENCOUNTER — Encounter: Payer: Self-pay | Admitting: Student

## 2021-03-06 VITALS — BP 130/69 | HR 81 | Ht 63.0 in | Wt 146.5 lb

## 2021-03-06 DIAGNOSIS — Z599 Problem related to housing and economic circumstances, unspecified: Secondary | ICD-10-CM

## 2021-03-06 DIAGNOSIS — I10 Essential (primary) hypertension: Secondary | ICD-10-CM

## 2021-03-06 DIAGNOSIS — E039 Hypothyroidism, unspecified: Secondary | ICD-10-CM

## 2021-03-06 DIAGNOSIS — E119 Type 2 diabetes mellitus without complications: Secondary | ICD-10-CM

## 2021-03-06 DIAGNOSIS — E782 Mixed hyperlipidemia: Secondary | ICD-10-CM

## 2021-03-06 MED ORDER — GLIPIZIDE 5 MG PO TABS: 5.0000 mg | ORAL_TABLET | Freq: Every day | ORAL | 2 refills | Status: DC

## 2021-03-06 MED ORDER — LEVOTHYROXINE SODIUM 75 MCG PO TABS: ORAL_TABLET | ORAL | 2 refills | Status: DC

## 2021-03-06 MED ORDER — METFORMIN HCL 850 MG PO TABS: 850.0000 mg | ORAL_TABLET | Freq: Three times a day (TID) | ORAL | 2 refills | Status: DC

## 2021-03-06 MED ORDER — SIMVASTATIN 40 MG PO TABS: 40.0000 mg | ORAL_TABLET | Freq: Every day | ORAL | 2 refills | Status: DC

## 2021-03-06 MED ORDER — AMLODIPINE BESYLATE 10 MG PO TABS: 10.0000 mg | ORAL_TABLET | Freq: Every day | ORAL | 0 refills | Status: DC

## 2021-03-06 NOTE — Assessment & Plan Note (Signed)
Compliant with medication, will check lipid panel today and refilled Zocor 40 mg.

## 2021-03-06 NOTE — Patient Instructions (Signed)
It was great to see you today! Thank you for choosing [facility] for your primary care. Brittany Pineda was seen for medication refill.  Today we addressed: Refilled medications for diabetes, hypertension, hypothyroidism, hyperlipidemia.  We are also checking labs for these conditions. It is safe to take your supplements of garlic and Ashwagandha Please follow-up in 1 month for your annual physical exam to address care gaps such as vaccines and general screening.   Orders Placed This Encounter  Procedures   TSH   Lipid panel    Order Specific Question:   Has the patient fasted?    Answer:   No   Basic Metabolic Panel    Order Specific Question:   Has the patient fasted?    Answer:   No   POCT HgB A1C   Meds ordered this encounter  Medications   glipiZIDE (GLUCOTROL) 5 MG tablet    Sig: Take 1 tablet (5 mg total) by mouth daily.    Dispense:  30 tablet    Refill:  2   levothyroxine (SYNTHROID) 75 MCG tablet    Sig: TAKE 1 TABLET BY MOUTH ONCE DAILY BEFORE BREAKFAST.    Dispense:  30 tablet    Refill:  2   metFORMIN (GLUCOPHAGE) 850 MG tablet    Sig: Take 1 tablet (850 mg total) by mouth with breakfast, with lunch, and with evening meal. Please make office appointment. 1st attempt    Dispense:  90 tablet    Refill:  2    Pt needs to make an appointment with clinic for further refills.   amLODipine (NORVASC) 10 MG tablet    Sig: Take 1 tablet (10 mg total) by mouth at bedtime.    Dispense:  90 tablet    Refill:  0   simvastatin (ZOCOR) 40 MG tablet    Sig: Take 1 tablet (40 mg total) by mouth at bedtime for 90 doses. Please make clinic appointment for additional refills.  First attempt    Dispense:  30 tablet    Refill:  2    We are checking some labs today. If they are abnormal, I will call you. If they are normal, I will send you a MyChart message (if it is active) or a letter in the mail. If you do not hear about your labs in the next 2 weeks,  please call the office.  You should return to our clinic Return in about 1 month (around 04/06/2021) for annual physical..  I recommend that you always bring your medications to each appointment as this makes it easy to ensure you are on the correct medications and helps [place] not miss refills when you need them.  Please arrive 15 minutes before your appointment to ensure smooth check in process.  We appreciate your efforts in making this happen.  Take care and seek immediate care sooner if you develop any concerns.   Thank you for allowing me to participate in your care, Shelby Mattocks, DO 03/06/2021, 4:38 PM PGY-1, [facility]

## 2021-03-06 NOTE — Assessment & Plan Note (Signed)
Checkout note placed to give patient Brittany Pineda financial support packet.  Patient has out of date for several care gaps and will likely need support to have these completed (ophthalmology exam for diabetic eye screening, colonoscopy, mammogram).

## 2021-03-06 NOTE — Assessment & Plan Note (Signed)
Blood pressure 130/69.  Compliant with medications, refilled amlodipine 10 mg today.

## 2021-03-06 NOTE — Assessment & Plan Note (Addendum)
Compliant with medications, refilled metformin and glipizide, recheck A1c today.  Check BMP today for kidney functioning.

## 2021-03-06 NOTE — Assessment & Plan Note (Signed)
Compliant with medication, will get TSH today and refilled levothyroxine.

## 2021-03-07 ENCOUNTER — Other Ambulatory Visit: Payer: Self-pay | Admitting: Student

## 2021-03-07 DIAGNOSIS — I10 Essential (primary) hypertension: Secondary | ICD-10-CM

## 2021-03-07 DIAGNOSIS — E782 Mixed hyperlipidemia: Secondary | ICD-10-CM

## 2021-03-07 DIAGNOSIS — E039 Hypothyroidism, unspecified: Secondary | ICD-10-CM

## 2021-03-07 DIAGNOSIS — E119 Type 2 diabetes mellitus without complications: Secondary | ICD-10-CM

## 2021-03-11 ENCOUNTER — Other Ambulatory Visit: Payer: Self-pay

## 2021-03-11 DIAGNOSIS — E119 Type 2 diabetes mellitus without complications: Secondary | ICD-10-CM

## 2021-03-11 DIAGNOSIS — E039 Hypothyroidism, unspecified: Secondary | ICD-10-CM

## 2021-03-11 DIAGNOSIS — I10 Essential (primary) hypertension: Secondary | ICD-10-CM

## 2021-03-11 DIAGNOSIS — E782 Mixed hyperlipidemia: Secondary | ICD-10-CM

## 2021-03-11 NOTE — Telephone Encounter (Signed)
Called pharmacy to verify that patient already has refills submitted from her visit on 03/06/21. Refills sent from pharmacy not appropriate at this time.

## 2021-03-11 NOTE — Telephone Encounter (Signed)
Pt has an appt on 2/28 with PCP. Sunday Spillers, CMA

## 2021-03-12 ENCOUNTER — Ambulatory Visit (INDEPENDENT_AMBULATORY_CARE_PROVIDER_SITE_OTHER): Payer: Self-pay

## 2021-03-12 ENCOUNTER — Ambulatory Visit (INDEPENDENT_AMBULATORY_CARE_PROVIDER_SITE_OTHER): Payer: Self-pay | Admitting: Family Medicine

## 2021-03-12 ENCOUNTER — Encounter: Payer: Self-pay | Admitting: Family Medicine

## 2021-03-12 ENCOUNTER — Other Ambulatory Visit: Payer: Self-pay

## 2021-03-12 VITALS — Ht 63.0 in | Wt 148.2 lb

## 2021-03-12 DIAGNOSIS — E782 Mixed hyperlipidemia: Secondary | ICD-10-CM

## 2021-03-12 DIAGNOSIS — E039 Hypothyroidism, unspecified: Secondary | ICD-10-CM

## 2021-03-12 DIAGNOSIS — R32 Unspecified urinary incontinence: Secondary | ICD-10-CM

## 2021-03-12 DIAGNOSIS — Z23 Encounter for immunization: Secondary | ICD-10-CM

## 2021-03-12 DIAGNOSIS — I10 Essential (primary) hypertension: Secondary | ICD-10-CM

## 2021-03-12 DIAGNOSIS — E119 Type 2 diabetes mellitus without complications: Secondary | ICD-10-CM

## 2021-03-12 DIAGNOSIS — R8290 Unspecified abnormal findings in urine: Secondary | ICD-10-CM

## 2021-03-12 DIAGNOSIS — R109 Unspecified abdominal pain: Secondary | ICD-10-CM

## 2021-03-12 LAB — POCT URINALYSIS DIP (MANUAL ENTRY)
Bilirubin, UA: NEGATIVE
Glucose, UA: NEGATIVE mg/dL
Nitrite, UA: NEGATIVE
Spec Grav, UA: 1.02 (ref 1.010–1.025)
Urobilinogen, UA: 0.2 E.U./dL
pH, UA: 5.5 (ref 5.0–8.0)

## 2021-03-12 LAB — POCT UA - MICROSCOPIC ONLY

## 2021-03-12 NOTE — Patient Instructions (Signed)
Vamos a revisar sus laboratorios hoy.  Su orina no pareca estar infectada, pero la hemos enviado para que la analicen ms a fondo para ver si hay alguna bacteria y, por ahora, suspenderemos los antibiticos.  Tambin estoy derivando a urologa ya que ha tenido algo de [employer] en la orina en los ltimos aos y ha tenido algunos episodios de incontinencia urinaria.

## 2021-03-12 NOTE — Progress Notes (Signed)
° ° °  SUBJECTIVE:   CHIEF COMPLAINT / HPI:   Spanish interpreter Olegario Shearer [phone]  UTI concern Patient reports that 3 days ago her blood pressure was elevated at home and that when she has had high blood pressures in the past she has noted she has had a UTI.  She denies symptoms such as abdominal pain, frequency, hesitancy, dribbling. She does note that she has had recent episodes of urinary incontinence that do not seem to have a trigger (such as increased abdominal pressure).   Missed labs Patient reports she was not able to come to the clinic to get the labs that were ordered at her last visit and would like to get them now.   PERTINENT  PMH / PSH: Reviewed  OBJECTIVE:   Ht 5\' 3"  (1.6 m)    Wt 148 lb 3.2 oz (67.2 kg)    BMI 26.25 kg/m   Gen: well-appearing, NAD CV: RRR, no m/r/g appreciated, no peripheral edema Pulm: CTAB, no wheezes/crackles GI: soft, non-tender, non-distended  ASSESSMENT/PLAN:   Concern for UTI UA today with only trace leukocytes, will send out for culture and hold off on antibiotics.   Urinary incontinence Patient has had new episodes of urinary incontinence that does not appear to have a trigger upon discussion. UA does also show some trace hematuria, though microscopic evaluation is only 0-2 RBC's. Given the new incontinence episodes, will refer to urology to determine if cystoscopy would be appropriate or not.   Chronic conditions labs Patient did not get labs from last visit. Will obtain the following: - BMP for HTN and DM - HbA1c for DM - TSH for hypothryoidism - Lipid panel for HDL and DM   Jeshurun Oaxaca, DO [place]

## 2021-03-12 NOTE — Assessment & Plan Note (Deleted)
TSH today 

## 2021-03-13 LAB — LIPID PANEL
Chol/HDL Ratio: 2.4 ratio (ref 0.0–4.4)
Cholesterol, Total: 131 mg/dL (ref 100–199)
HDL: 54 mg/dL (ref >39)
LDL Chol Calc (NIH): 57 mg/dL (ref 0–99)
Triglycerides: 110 mg/dL (ref 0–149)
VLDL Cholesterol Cal: 20 mg/dL (ref 5–40)

## 2021-03-13 LAB — BASIC METABOLIC PANEL
BUN/Creatinine Ratio: 31 — ABNORMAL HIGH (ref 12–28)
BUN: 22 mg/dL (ref 8–27)
CO2: 21 mmol/L (ref 20–29)
Calcium: 9.5 mg/dL (ref 8.7–10.3)
Chloride: 104 mmol/L (ref 96–106)
Creatinine, Ser: 0.72 mg/dL (ref 0.57–1.00)
Glucose: 72 mg/dL (ref 70–99)
Potassium: 4.3 mmol/L (ref 3.5–5.2)
Sodium: 142 mmol/L (ref 134–144)
eGFR: 94 mL/min/{1.73_m2} (ref >59)

## 2021-03-13 LAB — HEMOGLOBIN A1C
Est. average glucose Bld gHb Est-mCnc: 154 mg/dL
Hgb A1c MFr Bld: 7 % — ABNORMAL HIGH (ref 4.8–5.6)

## 2021-03-13 LAB — TSH: TSH: 3.88 u[IU]/mL (ref 0.450–4.500)

## 2021-03-14 LAB — URINE CULTURE

## 2021-04-07 NOTE — Progress Notes (Signed)
°  SUBJECTIVE:   CHIEF COMPLAINT / HPI:   Patient presents today requesting discussion for most recent labs for hypothyroidism, T2DM, HTN, HLD.  Additionally returning for discussion for healthcare maintenance as discussed in prior visit with myself.  PERTINENT  PMH / PSH:   Past Medical History:  Diagnosis Date   Diabetes mellitus    Hyperlipidemia    Hypertension    Thyroid disease     OBJECTIVE:  BP 140/74    Pulse 81    Wt 148 lb 9.6 oz (67.4 kg)    SpO2 98%    BMI 26.32 kg/m   General: NAD, pleasant, able to participate in exam Cardiac: RRR, no murmurs auscultated. Respiratory: CTAB, normal effort, no wheezes, rales or rhonchi Neuro: alert, no obvious focal deficits, speech normal, normal and appropriate gait  ASSESSMENT/PLAN:  Hypothyroidism TSH level normal, no changes made.  Hyperlipidemia Lipid panel unremarkable, no changes made to medications.  Controlled type 2 diabetes mellitus without complication, without long-term current use of insulin (HCC) A1c elevated from 6.7 to 7.0.  Additionally, due for urine microalbumin.  We will check this today and consider initiating SGLT2 in the future given patient has diabetes and hypertension.  Further, advised patient to seek optometry/ophthalmology for diabetic eye exam.  Return in 1 month for diabetes follow-up.  Health care maintenance Referral placed and handouts given for mammogram and colonoscopy.  Further, advised patient receive vaccines at health department where they may be cheaper for her.   Orders Placed This Encounter  Procedures   MM DIGITAL SCREENING BILATERAL    Standing Status:   Future    Standing Expiration Date:   04/07/2022    Order Specific Question:   Reason for Exam (SYMPTOM  OR DIAGNOSIS REQUIRED)    Answer:   Screening mammogram    Order Specific Question:   Preferred imaging location?    Answer:   [facility] / creatinine, urine ratio   Ambulatory referral to  Gastroenterology    Referral Priority:   Routine    Referral Type:   Consultation    Referral Reason:   Specialty Services Required    Number of Visits Requested:   1   Return in about 1 month (around 05/06/2021) for Diabetes follow-up. Wells Guiles, DO 04/08/2021, 7:21 PM PGY-1, [place]

## 2021-04-08 ENCOUNTER — Other Ambulatory Visit: Payer: Self-pay

## 2021-04-08 ENCOUNTER — Encounter: Payer: Self-pay | Admitting: Student

## 2021-04-08 ENCOUNTER — Ambulatory Visit (INDEPENDENT_AMBULATORY_CARE_PROVIDER_SITE_OTHER): Payer: Self-pay | Admitting: Student

## 2021-04-08 VITALS — BP 140/74 | HR 81 | Wt 148.6 lb

## 2021-04-08 DIAGNOSIS — Z0000 Encounter for general adult medical examination without abnormal findings: Secondary | ICD-10-CM | POA: Insufficient documentation

## 2021-04-08 DIAGNOSIS — E119 Type 2 diabetes mellitus without complications: Secondary | ICD-10-CM

## 2021-04-08 DIAGNOSIS — E039 Hypothyroidism, unspecified: Secondary | ICD-10-CM

## 2021-04-08 DIAGNOSIS — E782 Mixed hyperlipidemia: Secondary | ICD-10-CM

## 2021-04-08 NOTE — Assessment & Plan Note (Signed)
Lipid panel unremarkable, no changes made to medications.

## 2021-04-08 NOTE — Assessment & Plan Note (Signed)
TSH level normal, no changes made.

## 2021-04-08 NOTE — Patient Instructions (Signed)
It was great to see you today! Thank you for choosing [facility] for your primary care. Brittany Pineda was seen for annual wellness exam and discussion of previous labs.  Today we addressed: If you are in need of any vaccines or financial assistance for medical needs, the health department is a great resource. I advise you to look into seeing an eye doctor for your glasses and diabetic eye exams.  Please let them know that you have diabetes and need a diabetic eye exam and for them to send their records to [place]. I have placed referrals for a mammogram and colonoscopy to screen for breast and colon cancer. We are checking for protein in your urine as that can be a complication of diabetes.  As mentioned previously, I would like you to return in about 1 month to discuss your diabetes and see if we need to make any further changes regarding her medications.   Orders Placed This Encounter  Procedures   MM DIGITAL SCREENING BILATERAL    Standing Status:   Future    Standing Expiration Date:   04/07/2022    Order Specific Question:   Reason for Exam (SYMPTOM  OR DIAGNOSIS REQUIRED)    Answer:   Screening mammogram    Order Specific Question:   Preferred imaging location?    Answer:   [facility] / creatinine, urine ratio   Ambulatory referral to Gastroenterology    Referral Priority:   Routine    Referral Type:   Consultation    Referral Reason:   Specialty Services Required    Number of Visits Requested:   1    We are checking some labs today. If they are abnormal, I will call you. If they are normal, I will send you a MyChart message (if it is active) or a letter in the mail. If you do not hear about your labs in the next 2 weeks, please call the office.  You should return to our clinic Return in about 1 month (around 05/06/2021) for Diabetes follow-up..  I recommend that you always bring your medications to each appointment as this makes it easy to  ensure you are on the correct medications and helps [place] not miss refills when you need them.  Please arrive 15 minutes before your appointment to ensure smooth check in process.  We appreciate your efforts in making this happen.  Take care and seek immediate care sooner if you develop any concerns.   Thank you for allowing me to participate in your care, Shelby Mattocks, DO 04/08/2021, 4:48 PM PGY-1, [facility]

## 2021-04-08 NOTE — Assessment & Plan Note (Addendum)
A1c elevated from 6.7 to 7.0.  Additionally, due for urine microalbumin.  We will check this today and consider initiating SGLT2 in the future given patient has diabetes and hypertension.  Further, advised patient to seek optometry/ophthalmology for diabetic eye exam.  Return in 1 month for diabetes follow-up.

## 2021-04-08 NOTE — Assessment & Plan Note (Signed)
Referral placed and handouts given for mammogram and colonoscopy.  Further, advised patient receive vaccines at health department where they may be cheaper for her.

## 2021-04-09 LAB — MICROALBUMIN / CREATININE URINE RATIO
Creatinine, Urine: 16.2 mg/dL
Microalb/Creat Ratio: 171 mg/g creat — ABNORMAL HIGH (ref 0–29)
Microalbumin, Urine: 27.7 ug/mL

## 2021-04-13 IMAGING — CR CHEST - 2 VIEW
2 series · 2 of 2 positions shown · non-contrast
Comparison: Portable chest 06/27/2018.

CLINICAL DATA: 62-year-old female with chest pain and shortness of
breath.

EXAM:
CHEST - 2 VIEW

[chest pa]
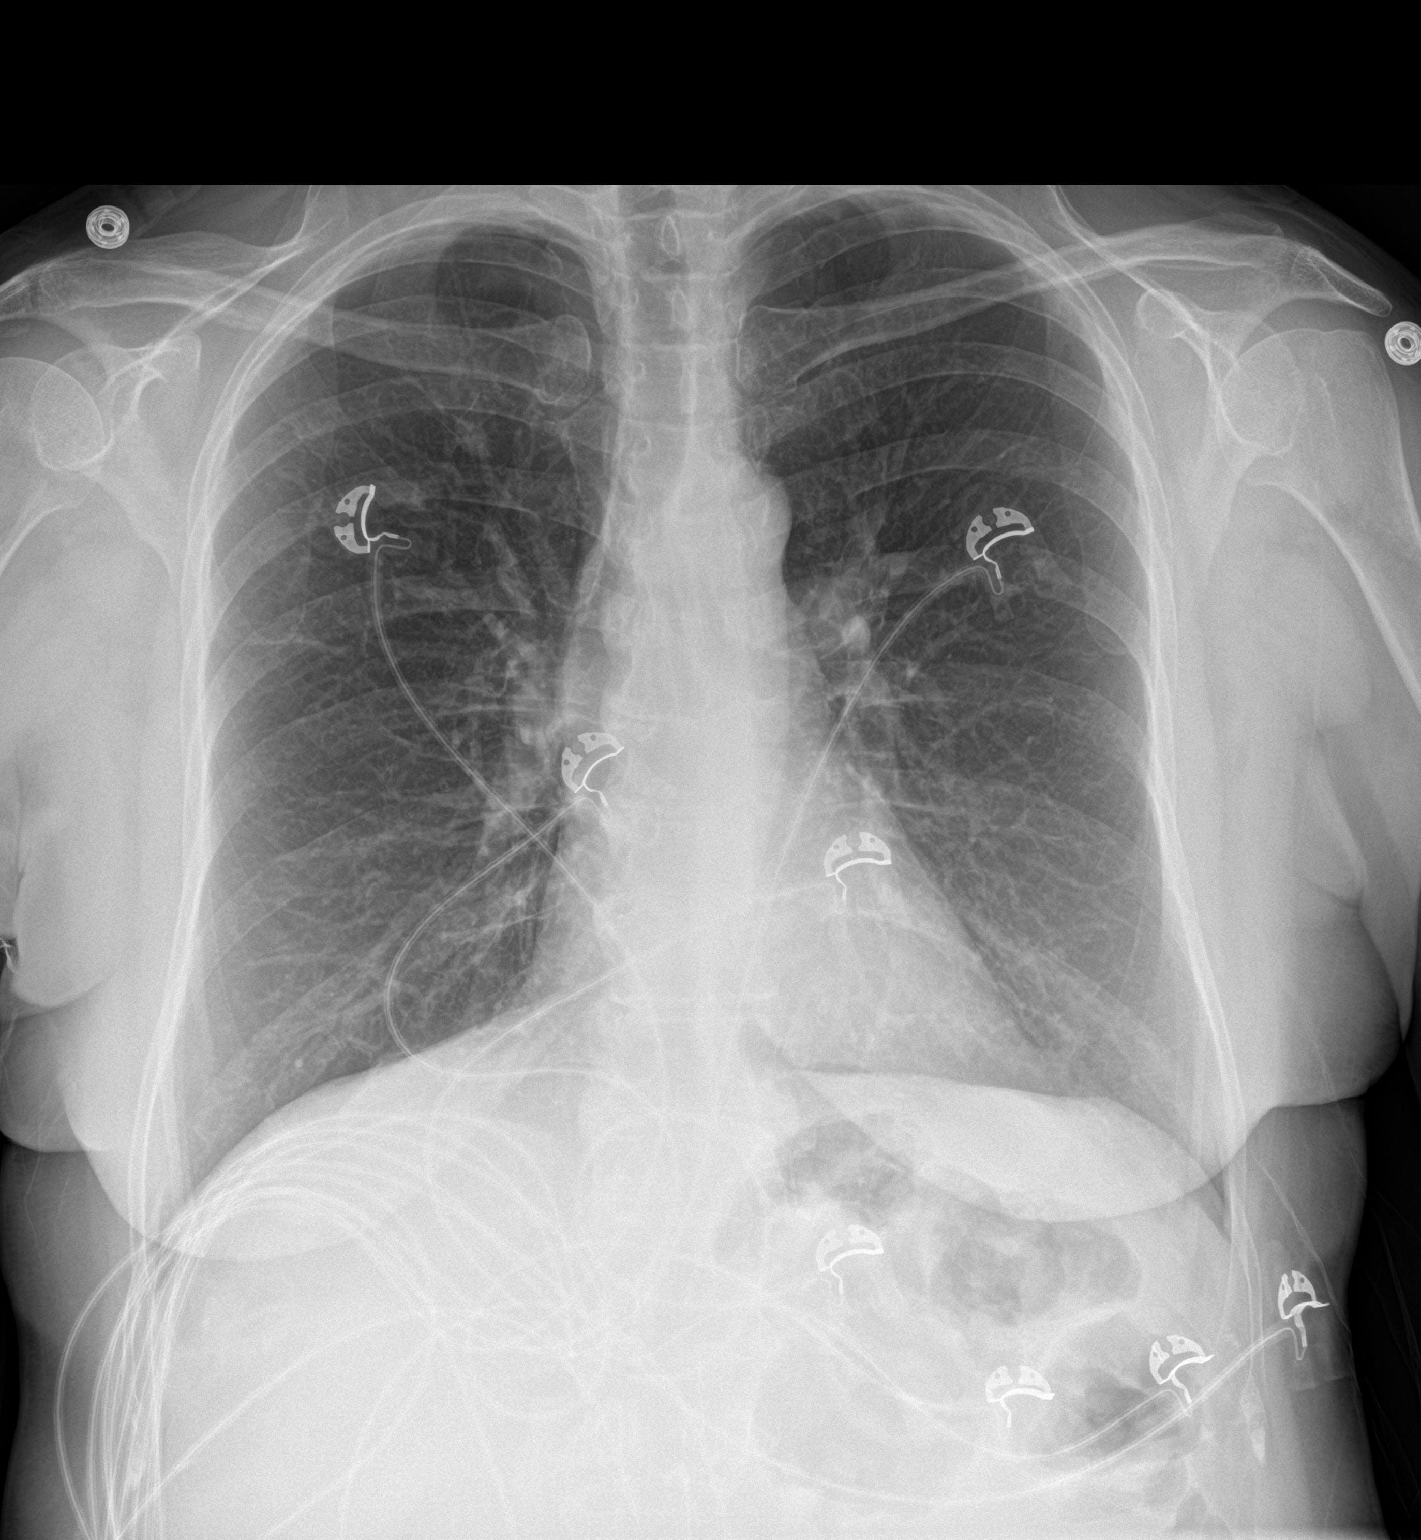

[chest lat]
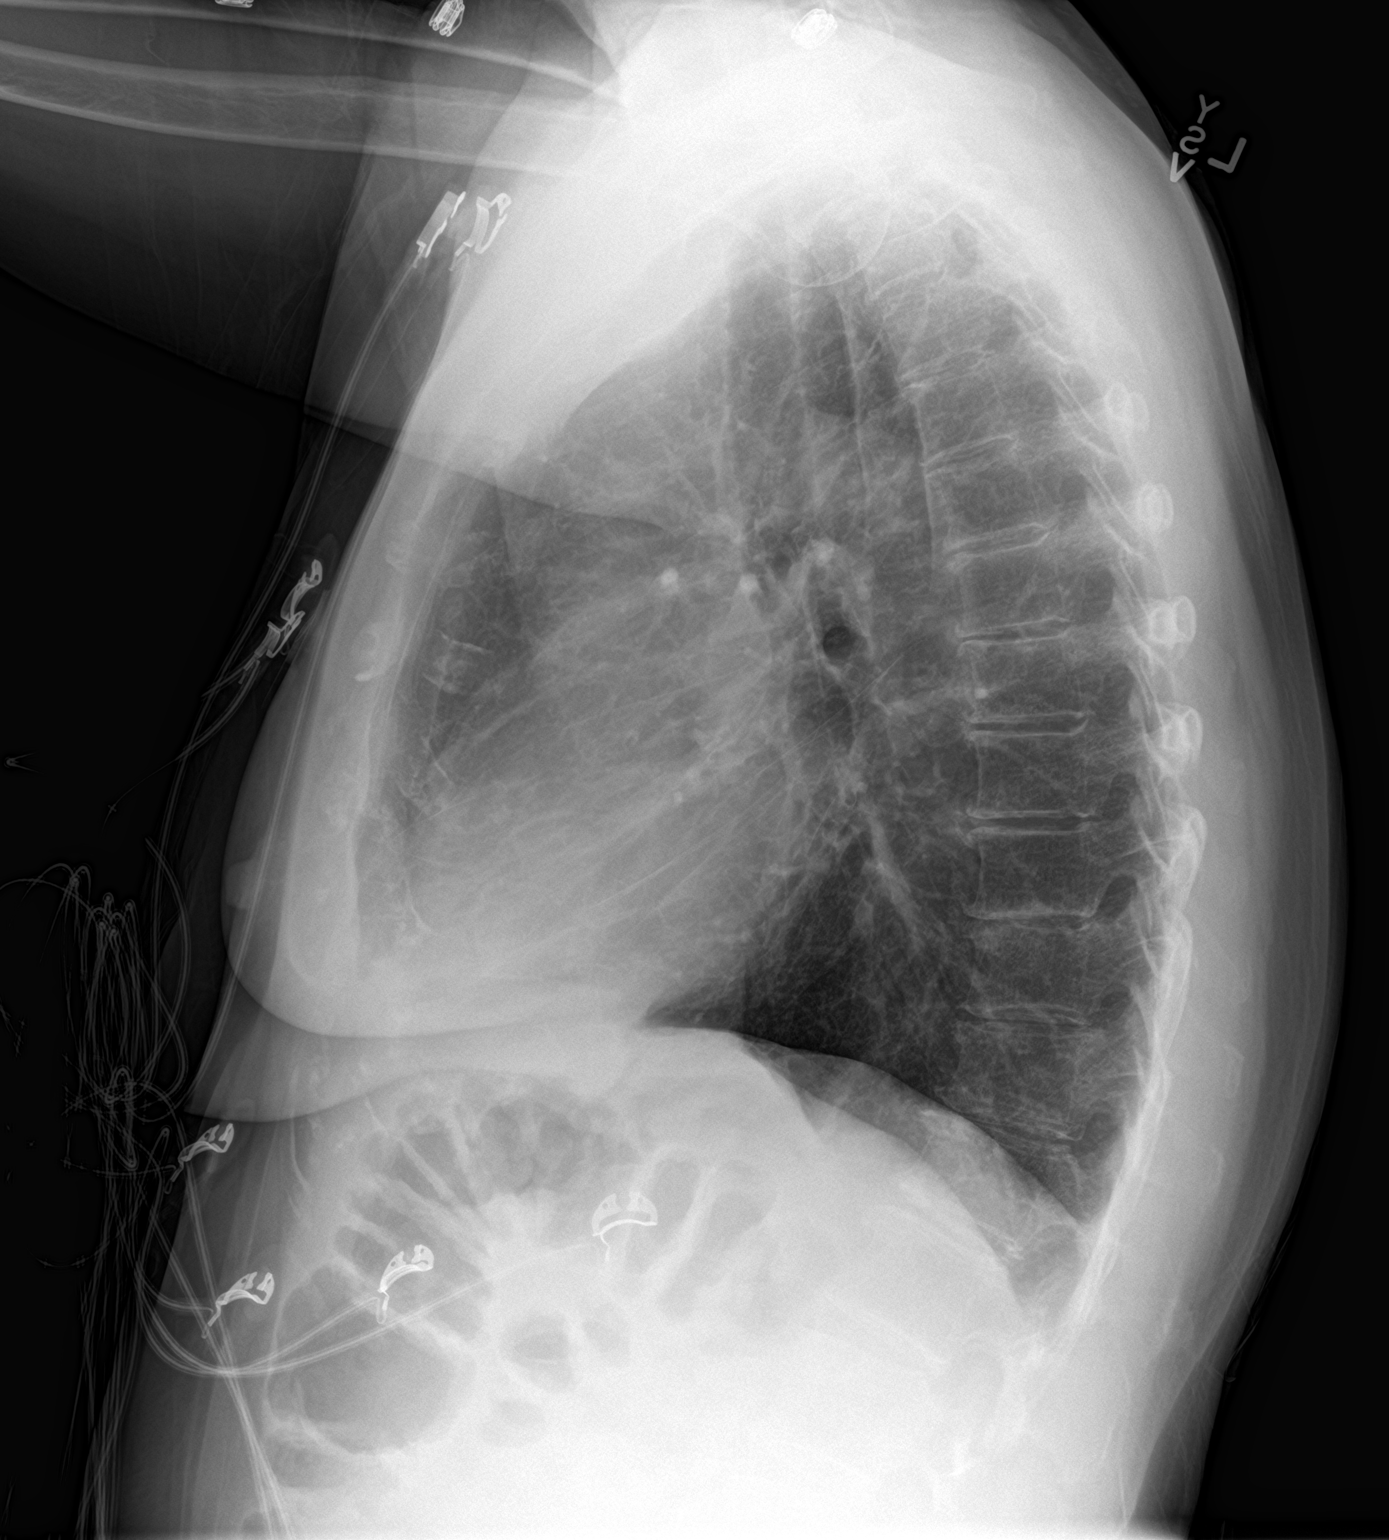

[2 of 2 positions shown; findings below may reference images not displayed]

FINDINGS: PA and lateral views. Lung volumes are at the upper limits of
normal. Normal cardiac size and mediastinal contours. Visualized
tracheal air column is within normal limits. Both lungs appear
clear. No pneumothorax or pleural effusion. No acute osseous
abnormality identified. Negative visible bowel gas pattern.
IMPRESSION: No acute cardiopulmonary abnormality.

## 2021-04-15 ENCOUNTER — Other Ambulatory Visit: Payer: Self-pay | Admitting: Student

## 2021-04-15 DIAGNOSIS — E119 Type 2 diabetes mellitus without complications: Secondary | ICD-10-CM

## 2021-04-15 DIAGNOSIS — E782 Mixed hyperlipidemia: Secondary | ICD-10-CM

## 2021-04-15 DIAGNOSIS — I10 Essential (primary) hypertension: Secondary | ICD-10-CM

## 2021-04-15 DIAGNOSIS — E039 Hypothyroidism, unspecified: Secondary | ICD-10-CM

## 2021-04-21 ENCOUNTER — Telehealth: Payer: Self-pay | Admitting: Student

## 2021-04-21 ENCOUNTER — Other Ambulatory Visit: Payer: Self-pay | Admitting: *Deleted

## 2021-04-21 DIAGNOSIS — E119 Type 2 diabetes mellitus without complications: Secondary | ICD-10-CM

## 2021-04-21 MED ORDER — METFORMIN HCL 850 MG PO TABS: 850.0000 mg | ORAL_TABLET | Freq: Three times a day (TID) | ORAL | 5 refills | Status: DC

## 2021-04-21 MED ORDER — LOSARTAN POTASSIUM 25 MG PO TABS: 25.0000 mg | ORAL_TABLET | Freq: Every day | ORAL | 3 refills | Status: DC

## 2021-04-21 NOTE — Telephone Encounter (Signed)
Spoke with patient about proteinuria. Advised starting losartan vs rechecking urine at a later date. Pt amenable to starting medication. Rx sent for Losartan 25mg  daily. Pt scheduled for visit on March 30th @1545 . ?

## 2021-05-06 IMAGING — CR PORTABLE CHEST - 1 VIEW
1 series · 1 of 1 positions shown · non-contrast
Comparison: July 22, 2018

CLINICAL DATA: Chest pain and nausea

EXAM:
PORTABLE CHEST 1 VIEW

[AP]
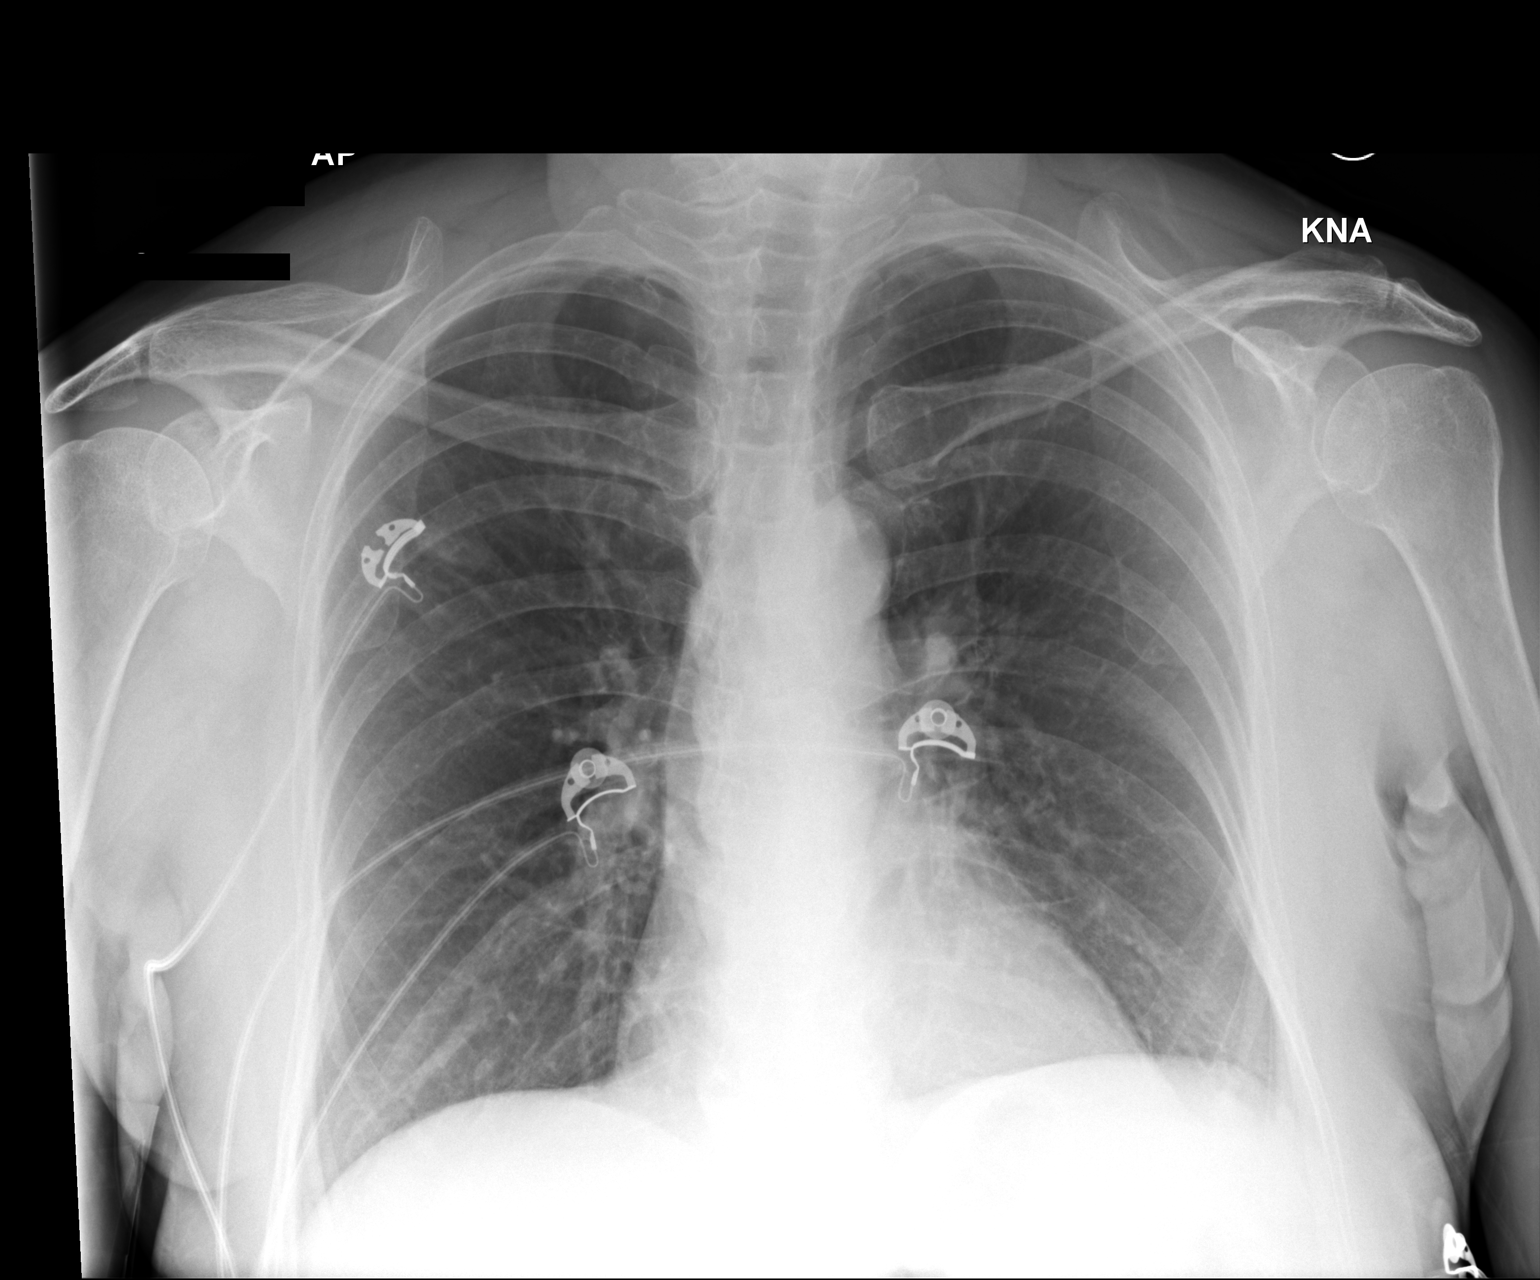

[1 of 1 positions shown; findings below may reference images not displayed]

FINDINGS: The heart size and mediastinal contours are within normal limits.
Both lungs are clear. The visualized skeletal structures are
unremarkable.
IMPRESSION: No active disease.

## 2021-05-08 ENCOUNTER — Encounter: Payer: Self-pay | Admitting: Student

## 2021-05-08 ENCOUNTER — Ambulatory Visit (INDEPENDENT_AMBULATORY_CARE_PROVIDER_SITE_OTHER): Payer: Self-pay | Admitting: Student

## 2021-05-08 VITALS — BP 130/80 | HR 79 | Ht 63.0 in | Wt 139.4 lb

## 2021-05-08 DIAGNOSIS — I10 Essential (primary) hypertension: Secondary | ICD-10-CM

## 2021-05-08 DIAGNOSIS — E1142 Type 2 diabetes mellitus with diabetic polyneuropathy: Secondary | ICD-10-CM

## 2021-05-08 NOTE — Patient Instructions (Signed)
It was great to see you today! Thank you for choosing [facility] for your primary care. Brittany Pineda was seen for type 2 diabetes. ? ?Today we addressed: ?We stopped your glipizide and please return to taking metformin 3 times daily.  ?You have considerable numbness in your feet and may benefit from seeing podiatry for well fitted shoes.  Please check your feet daily for ulcers as regular foot care is important when beginning to develop numbness in the feet. ?Please do your best to see an eye doctor in the near future as this is another important preventative measure for diabetics. ? ?You should return to our clinic Return in about 2 months (around 07/08/2021) for T2DM follow up. ? ?I recommend that you always bring your medications to each appointment as this makes it easy to ensure you are on the correct medications and helps [place] not miss refills when you need them. ? ?Please arrive 15 minutes before your appointment to ensure smooth check in process.  We appreciate your efforts in making this happen. ? ?Take care and seek immediate care sooner if you develop any concerns.  ? ?Thank you for allowing me to participate in your care, ?Wells Guiles, DO ?05/08/2021, 4:24 PM ?PGY-1, [place] ? ? ?

## 2021-05-08 NOTE — Assessment & Plan Note (Deleted)
Started Losartan 25mg  daily for renal protection after proteinuria detected on last urine results.  ? ?Continue metformin 850mg  TID, Glipizide 5mg  daily, and losartan for renal protection.  ?

## 2021-05-08 NOTE — Assessment & Plan Note (Signed)
Well-controlled at 130/80.  Continue amlodipine 10 mg daily and losartan 25 mg daily. ?

## 2021-05-08 NOTE — Assessment & Plan Note (Addendum)
Metformin 850 mg 3 times daily.  Discontinued glipizide.  Patient does not have insurance therefore SGLT2 inhibitor likely too expensive for her. Peripheral neuropathy without skin breakdown, recommended podiatry.  Further recommended ophthalmology. Recheck microalbumin urine and A1c in 2 months.  ?

## 2021-05-08 NOTE — Progress Notes (Signed)
?  SUBJECTIVE:  ? ?CHIEF COMPLAINT / HPI:  ? ?T2DM: She has decided to cut back on her sugar intake and no longer eat sweets. She is also only taking her metformin twice a day instead of 3x daily. She recalls that sometimes her blood sugar drops too low 115-150. She notices that when she takes glipizide she drops to 75 and that makes her nervous. She is also nervous about a foot exam because she knows she cannot feel portions of her feet/toes. ? ?PERTINENT  PMH / PSH:  ? ?Past Medical History:  ?Diagnosis Date  ? Diabetes mellitus   ? Hyperlipidemia   ? Hypertension   ? Thyroid disease   ? ? ?OBJECTIVE:  ?BP 130/80   Pulse 79   Ht 5\' 3"  (1.6 m)   Wt 139 lb 6.4 oz (63.2 kg)   SpO2 98%   BMI 24.69 kg/m?  ? ?General: NAD, pleasant, able to participate in exam ?Cardiac: RRR, no murmurs auscultated. ?Respiratory: CTAB, normal effort, no wheezes, rales or rhonchi ?Foot Exam: no sensation in 3rd and 5th toes bilaterally, no ulcers or skin breakdown appreciated, thickened toenails ? ?ASSESSMENT/PLAN:  ?Diabetic polyneuropathy associated with type 2 diabetes mellitus ([place]) ?Metformin 150 mg 3 times daily.  Discontinued glipizide.  Patient does not have insurance therefore SGLT2 inhibitor likely too expensive for her. Peripheral neuropathy without skin breakdown, recommended podiatry.  Further recommended ophthalmology. Recheck microalbumin urine and A1c in 2 months.  ? ?Essential hypertension ?Well-controlled at 130/80.  Continue amlodipine 10 mg daily and losartan 25 mg daily. ? ?Return in about 2 months (around 07/08/2021) for T2DM follow up. ?Wells Guiles, DO ?05/08/2021, 4:56 PM ?PGY-1, [place] ? ? ?

## 2021-09-23 ENCOUNTER — Encounter: Payer: Self-pay | Admitting: Family Medicine

## 2021-12-25 ENCOUNTER — Other Ambulatory Visit: Payer: Self-pay | Admitting: Student

## 2021-12-25 DIAGNOSIS — E119 Type 2 diabetes mellitus without complications: Secondary | ICD-10-CM

## 2021-12-25 DIAGNOSIS — I10 Essential (primary) hypertension: Secondary | ICD-10-CM

## 2021-12-25 DIAGNOSIS — E039 Hypothyroidism, unspecified: Secondary | ICD-10-CM

## 2021-12-26 ENCOUNTER — Other Ambulatory Visit: Payer: Self-pay | Admitting: Student

## 2021-12-26 DIAGNOSIS — I10 Essential (primary) hypertension: Secondary | ICD-10-CM

## 2022-02-16 ENCOUNTER — Other Ambulatory Visit: Payer: Self-pay | Admitting: Student

## 2022-02-16 DIAGNOSIS — E119 Type 2 diabetes mellitus without complications: Secondary | ICD-10-CM

## 2022-03-24 ENCOUNTER — Other Ambulatory Visit: Payer: Self-pay | Admitting: Student

## 2022-03-24 DIAGNOSIS — E039 Hypothyroidism, unspecified: Secondary | ICD-10-CM

## 2022-03-24 DIAGNOSIS — E119 Type 2 diabetes mellitus without complications: Secondary | ICD-10-CM

## 2022-03-24 DIAGNOSIS — E782 Mixed hyperlipidemia: Secondary | ICD-10-CM

## 2022-03-25 ENCOUNTER — Other Ambulatory Visit: Payer: Self-pay | Admitting: Student

## 2022-06-08 ENCOUNTER — Encounter: Payer: Self-pay | Admitting: Student

## 2022-06-08 ENCOUNTER — Ambulatory Visit (INDEPENDENT_AMBULATORY_CARE_PROVIDER_SITE_OTHER): Payer: Self-pay | Admitting: Student

## 2022-06-08 VITALS — BP 110/62 | HR 93 | Temp 99.8°F | Ht 63.0 in | Wt 123.4 lb

## 2022-06-08 DIAGNOSIS — E782 Mixed hyperlipidemia: Secondary | ICD-10-CM

## 2022-06-08 DIAGNOSIS — E119 Type 2 diabetes mellitus without complications: Secondary | ICD-10-CM

## 2022-06-08 DIAGNOSIS — R531 Weakness: Principal | ICD-10-CM

## 2022-06-08 DIAGNOSIS — R5383 Other fatigue: Secondary | ICD-10-CM | POA: Insufficient documentation

## 2022-06-08 LAB — POCT URINALYSIS DIP (MANUAL ENTRY)
Glucose, UA: NEGATIVE mg/dL
Nitrite, UA: NEGATIVE
Protein Ur, POC: 100 mg/dL — AB
Spec Grav, UA: 1.025 (ref 1.010–1.025)
Urobilinogen, UA: 4 E.U./dL — AB
pH, UA: 5.5 (ref 5.0–8.0)

## 2022-06-08 LAB — POCT UA - MICROSCOPIC ONLY

## 2022-06-08 LAB — POCT GLYCOSYLATED HEMOGLOBIN (HGB A1C): HbA1c, POC (controlled diabetic range): 6.2 % (ref 0.0–7.0)

## 2022-06-08 NOTE — Progress Notes (Signed)
  SUBJECTIVE:   CHIEF COMPLAINT / HPI:   Feeling sick: 1 week feeling weak, chills, itching all over her body, pain all over. She has been able to sleep. Not been able to eat since yesterday, been so weak she could not prepare her own food. Denies fever, abdominal pain, nausea/vomiting, dysuria. She knows she's lost weight but states this was over time accidentally, not intentional.  *Of note, she has lost 16lbs since her visit ~1 year ago.   Spanish video interpreter used throughout encounter  PERTINENT  PMH / PSH: T2DM, HTN, HLD, hypothyroidism  Patient Care Team: Shelby Mattocks, DO as PCP - General (Family Medicine) Linna Darner, RD as Dietitian (Family Medicine) OBJECTIVE:  BP 110/62   Pulse 93   Temp 99.8 F (37.7 C) (Oral)   Ht 5\' 3"  (1.6 m)   Wt 123 lb 6.4 oz (56 kg)   SpO2 97%   BMI 21.86 kg/m  General: Fatigued appearing, NAD HEENT: Patient declined oral exam, no thyromegaly, pale conjunctiva CV: RRR, no murmurs auscultated Pulm: CTAB, normal WOB Abdomen: Soft, nontender, normoactive bowel sounds Skin: Warm, dry, no rashes appreciated Psych: Flat affect, appropriate speech  ASSESSMENT/PLAN:  Fatigue, unspecified type Assessment & Plan: Fatigue with general lack of further symptomatology, although her complaint of itching is an interesting addition.  Vitals and physical exam reassuring the exception of pale conjunctiva.  Differential diagnosis includes anemia, hypothyroidism, HIV, malignancy (never had colonoscopy or mammogram), UTI, poorly controlled diabetes, depression.  It is concerning that she has lost 16 pounds in the last year without intention.  Follow-up in 2 weeks for continued workup.  Orders: -     CBC with Differential/Platelet -     TSH -     HIV Antibody (routine testing w rflx) -     Comprehensive metabolic panel -     POCT urinalysis dipstick -     POCT UA - Microscopic Only  Mixed hyperlipidemia -     Lipid panel  Controlled type 2 diabetes  mellitus without complication, without long-term current use of insulin (HCC) -     POCT glycosylated hemoglobin (Hb A1C) -     Microalbumin / creatinine urine ratio  Return in about 2 weeks (around 06/22/2022) for fatigue follow up. Shelby Mattocks, DO 06/08/2022, 5:41 PM PGY-2, [facility]

## 2022-06-08 NOTE — Assessment & Plan Note (Addendum)
Fatigue with general lack of further symptomatology, although her complaint of itching is an interesting addition.  Vitals and physical exam reassuring the exception of pale conjunctiva.  Differential diagnosis includes anemia, hypothyroidism, HIV, malignancy (never had colonoscopy or mammogram), UTI, poorly controlled diabetes, depression.  It is concerning that she has lost 16 pounds in the last year without intention.  Follow-up in 2 weeks for continued workup.

## 2022-06-08 NOTE — Patient Instructions (Addendum)
  Fue genial verte hoy! Karl Pock por elegir [facility] para su atencin primaria. Narda Rutherford Velzquez-Cruz fue atendida por fatiga.  Hoy abordamos: 1. Hagmonos un anlisis de sangre hoy para investigar esto. Me preocupa que haya perdido peso y que acompae los sntomas. Si estos vuelven a ser normales, le sugiero encarecidamente que realicemos pruebas de deteccin de cncer de mama y de colon con Thereasa Solo y Neomia Dear colonoscopia.  Si an no lo ha hecho, regstrese en My Chart para tener fcil acceso a los resultados de sus laboratorios y Engineer, materials con su mdico de Marine scientist.  Estamos revisando algunos laboratorios hoy. Si son anormales, te Freight forwarder. Si son normales, Transport planner un mensaje de MyChart (si est Musician) o una carta por correo. Si no recibe informacin sobre sus laboratorios en las prximas 2 semanas, llame a la oficina.  Debe regresar a Armed forces logistics/support/administrative officer en aproximadamente 2 semanas (alrededor del 13/06/2022) para un seguimiento de Control and instrumentation engineer. Llegue 15 minutos antes de su cita para garantizar un proceso de registro sin problemas. Apreciamos sus esfuerzos para que esto suceda.  Gracias por permitirme participar en su Lewayne Bunting 29/05/2022 14:47 PGY-2, Medicina familiar de [facility]

## 2022-06-08 NOTE — Assessment & Plan Note (Signed)
>>  ASSESSMENT AND PLAN FOR FATIGUE WRITTEN ON 06/08/2022  5:41 PM BY Aariz Maish, DO  Fatigue with general lack of further symptomatology, although her complaint of itching is an interesting addition.  Vitals and physical exam reassuring the exception of pale conjunctiva.  Differential diagnosis includes anemia, hypothyroidism, HIV, malignancy (never had colonoscopy or mammogram), UTI, poorly controlled diabetes, depression.  It is concerning that she has lost 16 pounds in the last year without intention.  Follow-up in 2 weeks for continued workup.

## 2022-06-09 LAB — CBC WITH DIFFERENTIAL/PLATELET
Basophils Absolute: 0.1 10*3/uL (ref 0.0–0.2)
Basos: 1 %
EOS (ABSOLUTE): 0.3 10*3/uL (ref 0.0–0.4)
Eos: 2 %
Hematocrit: 32.1 % — ABNORMAL LOW (ref 34.0–46.6)
Hemoglobin: 10.7 g/dL — ABNORMAL LOW (ref 11.1–15.9)
Immature Grans (Abs): 0 10*3/uL (ref 0.0–0.1)
Immature Granulocytes: 0 %
Lymphocytes Absolute: 0.8 10*3/uL (ref 0.7–3.1)
Lymphs: 6 %
MCH: 31.7 pg (ref 26.6–33.0)
MCHC: 33.3 g/dL (ref 31.5–35.7)
MCV: 95 fL (ref 79–97)
Monocytes Absolute: 1 10*3/uL — ABNORMAL HIGH (ref 0.1–0.9)
Monocytes: 7 %
Neutrophils Absolute: 11.7 10*3/uL — ABNORMAL HIGH (ref 1.4–7.0)
Neutrophils: 84 %
Platelets: 490 10*3/uL — ABNORMAL HIGH (ref 150–450)
RBC: 3.38 x10E6/uL — ABNORMAL LOW (ref 3.77–5.28)
RDW: 11.5 % — ABNORMAL LOW (ref 11.7–15.4)
WBC: 14 10*3/uL — ABNORMAL HIGH (ref 3.4–10.8)

## 2022-06-09 LAB — TSH: TSH: 5.83 u[IU]/mL — ABNORMAL HIGH (ref 0.450–4.500)

## 2022-06-09 LAB — LIPID PANEL
Chol/HDL Ratio: 3.1 ratio (ref 0.0–4.4)
Cholesterol, Total: 138 mg/dL (ref 100–199)
HDL: 44 mg/dL (ref >39)
LDL Chol Calc (NIH): 80 mg/dL (ref 0–99)
Triglycerides: 67 mg/dL (ref 0–149)
VLDL Cholesterol Cal: 14 mg/dL (ref 5–40)

## 2022-06-09 LAB — MICROALBUMIN / CREATININE URINE RATIO
Creatinine, Urine: 246.8 mg/dL
Microalb/Creat Ratio: 94 mg/g creat — ABNORMAL HIGH (ref 0–29)
Microalbumin, Urine: 231 ug/mL

## 2022-06-09 LAB — COMPREHENSIVE METABOLIC PANEL
ALT: 9 IU/L (ref 0–32)
AST: 12 IU/L (ref 0–40)
Albumin/Globulin Ratio: 1.2 (ref 1.2–2.2)
Albumin: 3.7 g/dL — ABNORMAL LOW (ref 3.9–4.9)
Alkaline Phosphatase: 88 IU/L (ref 44–121)
BUN/Creatinine Ratio: 21 (ref 12–28)
BUN: 16 mg/dL (ref 8–27)
Bilirubin Total: 0.5 mg/dL (ref 0.0–1.2)
CO2: 21 mmol/L (ref 20–29)
Calcium: 9 mg/dL (ref 8.7–10.3)
Chloride: 97 mmol/L (ref 96–106)
Creatinine, Ser: 0.78 mg/dL (ref 0.57–1.00)
Globulin, Total: 3.1 g/dL (ref 1.5–4.5)
Glucose: 184 mg/dL — ABNORMAL HIGH (ref 70–99)
Potassium: 4.2 mmol/L (ref 3.5–5.2)
Sodium: 133 mmol/L — ABNORMAL LOW (ref 134–144)
Total Protein: 6.8 g/dL (ref 6.0–8.5)
eGFR: 85 mL/min/{1.73_m2} (ref >59)

## 2022-06-09 LAB — HIV ANTIBODY (ROUTINE TESTING W REFLEX): HIV Screen 4th Generation wRfx: NONREACTIVE

## 2022-06-13 ENCOUNTER — Inpatient Hospital Stay (HOSPITAL_COMMUNITY)
Admission: EM | Admit: 2022-06-13 | Discharge: 2022-06-19 | DRG: 872 | Disposition: A | Discharge status: Home Health | Payer: Self-pay | Attending: Family Medicine | Admitting: Family Medicine

## 2022-06-13 ENCOUNTER — Emergency Department (HOSPITAL_COMMUNITY): Payer: Self-pay

## 2022-06-13 ENCOUNTER — Encounter (HOSPITAL_COMMUNITY): Payer: Self-pay

## 2022-06-13 ENCOUNTER — Other Ambulatory Visit: Payer: Self-pay

## 2022-06-13 DIAGNOSIS — D519 Vitamin B12 deficiency anemia, unspecified: Secondary | ICD-10-CM | POA: Diagnosis present

## 2022-06-13 DIAGNOSIS — Z7984 Long term (current) use of oral hypoglycemic drugs: Secondary | ICD-10-CM

## 2022-06-13 DIAGNOSIS — Z800 Family history of malignant neoplasm of digestive organs: Secondary | ICD-10-CM

## 2022-06-13 DIAGNOSIS — E039 Hypothyroidism, unspecified: Secondary | ICD-10-CM | POA: Diagnosis present

## 2022-06-13 DIAGNOSIS — Z83438 Family history of other disorder of lipoprotein metabolism and other lipidemia: Secondary | ICD-10-CM

## 2022-06-13 DIAGNOSIS — Z811 Family history of alcohol abuse and dependence: Secondary | ICD-10-CM

## 2022-06-13 DIAGNOSIS — R4589 Other symptoms and signs involving emotional state: Secondary | ICD-10-CM | POA: Insufficient documentation

## 2022-06-13 DIAGNOSIS — E871 Hypo-osmolality and hyponatremia: Secondary | ICD-10-CM | POA: Diagnosis present

## 2022-06-13 DIAGNOSIS — D721 Eosinophilia, unspecified: Secondary | ICD-10-CM | POA: Diagnosis present

## 2022-06-13 DIAGNOSIS — A419 Sepsis, unspecified organism: Principal | ICD-10-CM | POA: Diagnosis present

## 2022-06-13 DIAGNOSIS — D75839 Thrombocytosis, unspecified: Secondary | ICD-10-CM | POA: Insufficient documentation

## 2022-06-13 DIAGNOSIS — Z87891 Personal history of nicotine dependence: Secondary | ICD-10-CM

## 2022-06-13 DIAGNOSIS — E1165 Type 2 diabetes mellitus with hyperglycemia: Secondary | ICD-10-CM | POA: Diagnosis present

## 2022-06-13 DIAGNOSIS — Z79899 Other long term (current) drug therapy: Secondary | ICD-10-CM

## 2022-06-13 DIAGNOSIS — D638 Anemia in other chronic diseases classified elsewhere: Secondary | ICD-10-CM | POA: Diagnosis present

## 2022-06-13 DIAGNOSIS — K2970 Gastritis, unspecified, without bleeding: Secondary | ICD-10-CM | POA: Diagnosis present

## 2022-06-13 DIAGNOSIS — Z1152 Encounter for screening for COVID-19: Secondary | ICD-10-CM

## 2022-06-13 DIAGNOSIS — R531 Weakness: Secondary | ICD-10-CM

## 2022-06-13 DIAGNOSIS — E785 Hyperlipidemia, unspecified: Secondary | ICD-10-CM | POA: Diagnosis present

## 2022-06-13 DIAGNOSIS — D531 Other megaloblastic anemias, not elsewhere classified: Secondary | ICD-10-CM

## 2022-06-13 DIAGNOSIS — K222 Esophageal obstruction: Secondary | ICD-10-CM | POA: Diagnosis present

## 2022-06-13 DIAGNOSIS — Z603 Acculturation difficulty: Secondary | ICD-10-CM | POA: Diagnosis present

## 2022-06-13 DIAGNOSIS — K922 Gastrointestinal hemorrhage, unspecified: Secondary | ICD-10-CM | POA: Diagnosis present

## 2022-06-13 DIAGNOSIS — E538 Deficiency of other specified B group vitamins: Secondary | ICD-10-CM

## 2022-06-13 DIAGNOSIS — I10 Essential (primary) hypertension: Secondary | ICD-10-CM | POA: Diagnosis present

## 2022-06-13 DIAGNOSIS — D649 Anemia, unspecified: Secondary | ICD-10-CM | POA: Insufficient documentation

## 2022-06-13 DIAGNOSIS — R634 Abnormal weight loss: Secondary | ICD-10-CM

## 2022-06-13 DIAGNOSIS — Z818 Family history of other mental and behavioral disorders: Secondary | ICD-10-CM

## 2022-06-13 DIAGNOSIS — K921 Melena: Secondary | ICD-10-CM | POA: Diagnosis present

## 2022-06-13 DIAGNOSIS — Z79890 Hormone replacement therapy (postmenopausal): Secondary | ICD-10-CM

## 2022-06-13 DIAGNOSIS — R630 Anorexia: Secondary | ICD-10-CM

## 2022-06-13 DIAGNOSIS — N390 Urinary tract infection, site not specified: Secondary | ICD-10-CM | POA: Insufficient documentation

## 2022-06-13 DIAGNOSIS — K449 Diaphragmatic hernia without obstruction or gangrene: Secondary | ICD-10-CM | POA: Diagnosis present

## 2022-06-13 DIAGNOSIS — E119 Type 2 diabetes mellitus without complications: Secondary | ICD-10-CM

## 2022-06-13 DIAGNOSIS — D62 Acute posthemorrhagic anemia: Secondary | ICD-10-CM | POA: Diagnosis present

## 2022-06-13 DIAGNOSIS — F32A Depression, unspecified: Secondary | ICD-10-CM | POA: Diagnosis present

## 2022-06-13 DIAGNOSIS — Z9049 Acquired absence of other specified parts of digestive tract: Secondary | ICD-10-CM

## 2022-06-13 HISTORY — DX: Other specified postprocedural states: Z98.890

## 2022-06-13 HISTORY — DX: Nausea with vomiting, unspecified: R11.2

## 2022-06-13 LAB — BASIC METABOLIC PANEL
Anion gap: 12 (ref 5–15)
BUN: 14 mg/dL (ref 8–23)
CO2: 23 mmol/L (ref 22–32)
Calcium: 8.5 mg/dL — ABNORMAL LOW (ref 8.9–10.3)
Chloride: 96 mmol/L — ABNORMAL LOW (ref 98–111)
Creatinine, Ser: 0.87 mg/dL (ref 0.44–1.00)
GFR, Estimated: >60 mL/min (ref >60)
Glucose, Bld: 232 mg/dL — ABNORMAL HIGH (ref 70–99)
Potassium: 4.4 mmol/L (ref 3.5–5.1)
Sodium: 131 mmol/L — ABNORMAL LOW (ref 135–145)

## 2022-06-13 LAB — CBC
HCT: 29.4 % — ABNORMAL LOW (ref 36.0–46.0)
Hemoglobin: 9.9 g/dL — ABNORMAL LOW (ref 12.0–15.0)
MCH: 31.9 pg (ref 26.0–34.0)
MCHC: 33.7 g/dL (ref 30.0–36.0)
MCV: 94.8 fL (ref 80.0–100.0)
Platelets: 701 10*3/uL — ABNORMAL HIGH (ref 150–400)
RBC: 3.1 MIL/uL — ABNORMAL LOW (ref 3.87–5.11)
RDW: 11.8 % (ref 11.5–15.5)
WBC: 20.4 10*3/uL — ABNORMAL HIGH (ref 4.0–10.5)
nRBC: 0 % (ref 0.0–0.2)

## 2022-06-13 LAB — PROTEIN / CREATININE RATIO, URINE
Creatinine, Urine: 207 mg/dL
Protein Creatinine Ratio: 0.55 mg/mg{Cre} — ABNORMAL HIGH (ref 0.00–0.15)
Total Protein, Urine: 113 mg/dL

## 2022-06-13 LAB — URINALYSIS, ROUTINE W REFLEX MICROSCOPIC
Bilirubin Urine: NEGATIVE
Glucose, UA: NEGATIVE mg/dL
Ketones, ur: NEGATIVE mg/dL
Nitrite: NEGATIVE
Protein, ur: 100 mg/dL — AB
Specific Gravity, Urine: 1.019 (ref 1.005–1.030)
pH: 5 (ref 5.0–8.0)

## 2022-06-13 LAB — LACTIC ACID, PLASMA: Lactic Acid, Venous: 1.5 mmol/L (ref 0.5–1.9)

## 2022-06-13 LAB — RESP PANEL BY RT-PCR (RSV, FLU A&B, COVID)  RVPGX2
Influenza A by PCR: NEGATIVE
Influenza B by PCR: NEGATIVE
Resp Syncytial Virus by PCR: NEGATIVE
SARS Coronavirus 2 by RT PCR: NEGATIVE

## 2022-06-13 LAB — GLUCOSE, CAPILLARY: Glucose-Capillary: 160 mg/dL — ABNORMAL HIGH (ref 70–99)

## 2022-06-13 MED ORDER — CETAPHIL MOISTURIZING EX LOTN
TOPICAL_LOTION | CUTANEOUS | Status: DC | PRN
  Filled 2022-06-13: qty 473

## 2022-06-13 MED ORDER — SODIUM CHLORIDE 0.9 % IV SOLN: INTRAVENOUS | Status: DC

## 2022-06-13 MED ORDER — HYDROXYZINE HCL 25 MG PO TABS
25.0000 mg | ORAL_TABLET | Freq: Three times a day (TID) | ORAL | Status: DC | PRN
  Administered 2022-06-15: 25 mg via ORAL
  Filled 2022-06-13: qty 1

## 2022-06-13 MED ORDER — CALAMINE EX LOTN
1.0000 | TOPICAL_LOTION | CUTANEOUS | Status: DC | PRN
  Filled 2022-06-13: qty 177

## 2022-06-13 MED ORDER — SODIUM CHLORIDE 0.9 % IV BOLUS
1000.0000 mL | Freq: Once | INTRAVENOUS | Status: AC
  Administered 2022-06-13: 1000 mL via INTRAVENOUS

## 2022-06-13 MED ORDER — SODIUM CHLORIDE 0.9 % IV BOLUS
500.0000 mL | Freq: Once | INTRAVENOUS | Status: AC
  Administered 2022-06-13: 500 mL via INTRAVENOUS

## 2022-06-13 MED ORDER — LEVOTHYROXINE SODIUM 75 MCG PO TABS: 75.0000 ug | ORAL_TABLET | Freq: Every day | ORAL | Status: DC

## 2022-06-13 MED ORDER — IOHEXOL 350 MG/ML SOLN
75.0000 mL | Freq: Once | INTRAVENOUS | Status: AC | PRN
  Administered 2022-06-13: 75 mL via INTRAVENOUS

## 2022-06-13 NOTE — ED Provider Notes (Signed)
[place] EMERGENCY DEPARTMENT AT [facility] Provider Note   CSN: [id number] Arrival date & time: 06/13/22  1346     History  Chief Complaint  Patient presents with   Weakness    Brittany Pineda is a 67 y.o. female history of hypertension, hypothyroidism, diabetes type 2 presented with 15 days of fatigue, generalized weakness.  Patient states that she is been eating less and has been feeling increasingly worse.  Patient has been seen by her primary care provider and stated that she is lost 20 pounds in the past few months unintentionally.  Patient is never had a colonoscopy but denies any hematochezia/melena.  Patient denies any new medications or medication changes.  Patient endorses pruritus across her abdomen but denies any skin color changes or rashes and does not know the cause of the pruritus.  Patient states with this generalized weakness she has not been nauseous or throwing up, short of breath, chest pain, fevers/chills.   Home Medications Prior to Admission medications   Medication Sig Start Date End Date Taking? Authorizing Provider  amLODipine (NORVASC) 10 MG tablet TAKE 1 TABLET BY MOUTH AT BEDTIME 12/29/21   Shelby Mattocks, DO  Blood Glucose Monitoring Suppl (AGAMATRIX PRESTO) w/Device KIT Use as instructed for PrestoWaveSense meter.  Dispense QS for testing up to thrice daily.  Dx E11.9 04/07/16   Janit Pagan T, MD  glucose blood test strip Use as instructed for PrestoWaveSense meter.  Dispense QS for testing up to thrice daily.  Dx E11.9 09/11/16   Shon Hale, MD  Lancets MISC Dispense quantity sufficient for thrice daily testing. 09/11/16   Shon Hale, MD  levothyroxine (SYNTHROID) 75 MCG tablet TAKE 1 TABLET BY MOUTH ONCE DAILY BEFORE BREAKFAST 03/24/22   Shelby Mattocks, DO  losartan (COZAAR) 25 MG tablet Take 1 tablet by mouth once daily 03/25/22   Shelby Mattocks, DO  metFORMIN (GLUCOPHAGE) 850 MG tablet TAKE 1 TABLET BY MOUTH  WITH BREAKFAST AND WITH LUNCH AND WITH SUPPER 03/24/22   Shelby Mattocks, DO  RELION SHORT PEN NEEDLES 31G X 8 MM MISC USE AS DIRECTED ONCE DAILY 07/09/15   Myra Rude, MD  simvastatin (ZOCOR) 40 MG tablet TAKE 1 TABLET BY MOUTH EVERY DAY AT BEDTIME 03/24/22   Shelby Mattocks, DO      Allergies    Patient has no known allergies.    Review of Systems   Review of Systems  Neurological:  Positive for weakness.    Physical Exam Updated Vital Signs BP (!) 99/56   Pulse 95   Temp 98.3 F (36.8 C) (Oral)   Resp 16   SpO2 100%  Physical Exam Constitutional:      Comments: Weak appearing  Eyes:     Extraocular Movements: Extraocular movements intact.     Conjunctiva/sclera: Conjunctivae normal.     Pupils: Pupils are equal, round, and reactive to light.  Cardiovascular:     Rate and Rhythm: Normal rate and regular rhythm.     Pulses: Normal pulses.     Heart sounds: Normal heart sounds.     Comments: 2+ bilateral radial/dorsalis pedal pulses with regular rate Pulmonary:     Effort: Pulmonary effort is normal. No respiratory distress.     Breath sounds: Normal breath sounds.  Abdominal:     Palpations: Abdomen is soft.     Tenderness: There is no abdominal tenderness. There is no guarding or rebound.     Comments: Abdominal scars from previous hysterectomy  and belly tuck  Musculoskeletal:     Cervical back: Normal range of motion.     Comments: No pitting edema 5 out of 5 bilateral grip/leg extension  Skin:    General: Skin is warm and dry.     Capillary Refill: Capillary refill takes 2 to 3 seconds.     Coloration: Skin is pale.  Neurological:     Mental Status: She is alert.     Comments: Sensation intact in all 4 limbs  Psychiatric:        Mood and Affect: Mood normal.     ED Results / Procedures / Treatments   Labs (all labs ordered are listed, but only abnormal results are displayed) Labs Reviewed  BASIC METABOLIC PANEL - Abnormal; Notable for the following  components:      Result Value   Sodium 131 (*)    Chloride 96 (*)    Glucose, Bld 232 (*)    Calcium 8.5 (*)    All other components within normal limits  CBC - Abnormal; Notable for the following components:   WBC 20.4 (*)    RBC 3.10 (*)    Hemoglobin 9.9 (*)    HCT 29.4 (*)    Platelets 701 (*)    All other components within normal limits  RESP PANEL BY RT-PCR (RSV, FLU A&B, COVID)  [id number]  URINALYSIS, ROUTINE W REFLEX MICROSCOPIC  CBG MONITORING, ED    EKG None  Radiology No results found.  Procedures Procedures    Medications Ordered in ED Medications - No data to display  ED Course/ Medical Decision Making/ A&P                             Medical Decision Making Amount and/or Complexity of Data Reviewed Labs: ordered. Radiology: ordered.  Risk Prescription drug management. Decision regarding hospitalization.   Verdell Carmine 67 y.o. presented today for fatigue. Working DDx that I considered at this time includes, but not limited to, myxedema coma, cancer, hypothyroidism, viral illness, anemia, GI bleed, electrolyte abnormalities.  R/o DDx: myxedema coma, cancer, hypothyroidism, viral illness, anemia, GI bleed, electrolyte abnormalities: These are considered less likely due to history of present illness and physical exam findings  Review of prior external notes: 06/08/2022 office visit  Unique Tests and My Interpretation:  CBC: Leukocytosis 20.4, hemoglobin 9.9 BMP: Unremarkable TSH: Pending Respiratory panel: Negative UA: Unremarkable, dirty sample CT abdomen pelvis with contrast: No acute intra-abdominal pathology EKG: Sinus 97 bpm, no ST abnormalities or blocks noted Chest x-ray: No acute cardiopulmonary changes Lactic: 1.5  Discussion with Independent Historian:  Family  Discussion of Management of Tests:  [place], MD Hospitalist  Risk: High: hospitalization or escalation of hospital-level care  Risk Stratification Score:  none  Staffed with Freida Busman, MD  Plan: Patient presented for fatigue. On exam patient was in no acute distress but did have a soft blood pressure of 99/56.  On exam patient appeared frail and weak but otherwise had an unremarkable physical exam.  Upon chart review patient's TSH was elevated at her primary care's office last week and so TSH to be drawn here to reevaluate TSH is her thyroid could be causing her fatigue however I do not note any signs of myxedema coma on exam.  Patient also does have a white count of 20.4 which is gradually been increasing but denies any fevers, nausea/vomiting, abdominal pain but instead just endorses fatigue.  Patient has never  had a colonoscopy and denies any rectal bleeding or medic easier melena however due to patient's age, presentation, labs and CT scan will be ordered to assess for possible source of leukocytosis.  Patient stable at this time.  Patient CT came back unremarkable along with the rest of her labs.  At this time we do not have a source for infection that would explain patient's leukocytosis and thrombocytosis.  Patient will be admitted for weakness to obs.  Patient updated of this plan and verbalized agreement.  Hospitalist consulted.  Hospitalist consulted and accepted patient for admission.  Patient stable for admission at this time.        Final Clinical Impression(s) / ED Diagnoses Final diagnoses:  None    Rx / DC Orders ED Discharge Orders     None         Remi Deter 06/13/22 2028    Lorre Nick, MD 06/14/22 1624

## 2022-06-13 NOTE — ED Notes (Signed)
Per RN collect only 1 set of blood cultures.

## 2022-06-13 NOTE — Assessment & Plan Note (Addendum)
Patient sat up with ease, much more talkative and engaged this AM. Reports improvement since yesterday. Non-symptomatic orthostatic vitals with ted hose- continue. Does appear steroid course has improved her strength, PMR possible. Likely multifactorial: Anemia (Iron deficiency and B12 deficiency), Hypothyroidism, PMR and acute illness. With Leukocytosis and thrombocytosis (not responded to abx), and weight loss there is still concern for malignancy. Will follow-up with Heme/Onc (see above). -Continue prednisone 20 mg daily -OP Heme

## 2022-06-13 NOTE — ED Notes (Signed)
Patient transported to CT 

## 2022-06-13 NOTE — Assessment & Plan Note (Signed)
Increase home dosage to bring TSH within range.

## 2022-06-13 NOTE — Assessment & Plan Note (Addendum)
Well controlled on metformin at home, holding while inpatient. Glucose >200 x2, will start insulin. - CBGs - SSI - Restart home metformin at dispo

## 2022-06-13 NOTE — ED Notes (Signed)
ED TO INPATIENT HANDOFF REPORT  ED Nurse Name and Phone #:   [phone] Raynaldo Opitz Name/Age/Gender Narda Rutherford Velazquez-Cruz 67 y.o. female Room/Bed: H021C/H021C  Code Status   Code Status: Not on file  Home/SNF/Other Home Patient oriented to: self, place, time, and situation Is this baseline? Yes   Triage Complete: Triage complete  Chief Complaint Weakness [R53.1]  Triage Note Pt arrived POV from home c/o generalized weakness, fatigue or eating well x8 days. Pt also endorses body aches and chills.    Allergies No Known Allergies  Level of Care/Admitting Diagnosis ED Disposition     ED Disposition  Admit   Condition  --   Comment  Hospital Area: [facility] [[id number]]  Level of Care: Med-Surg [16]  May place patient in observation at [facility] or [facility] if equivalent level of care is available:: No  Covid Evaluation: Asymptomatic - no recent exposure (last 10 days) testing not required  Diagnosis: Weakness [241835]  Admitting Physician: Alicia Amel [[id number]]  Attending Physician: Westley Chandler [[id number]]          B Medical/Surgery History Past Medical History:  Diagnosis Date   Diabetes mellitus    Hyperlipidemia    Hypertension    Thyroid disease    Past Surgical History:  Procedure Laterality Date   ABDOMINAL HYSTERECTOMY     CHOLECYSTECTOMY       A IV Location/Drains/Wounds Patient Lines/Drains/Airways Status     Active Line/Drains/Airways     Name Placement date Placement time Site Days   Peripheral IV 06/13/22 20 G Left;Posterior Hand 06/13/22  1625  Hand  less than 1            Intake/Output Last 24 hours  Intake/Output Summary (Last 24 hours) at 06/13/2022 2038 Last data filed at 06/13/2022 2015 Gross per 24 hour  Intake 1001.28 ml  Output --  Net 1001.28 ml    Labs/Imaging Results for orders placed or performed during the hospital encounter of 06/13/22 (from the past 48 hour(s))  Resp panel by  RT-PCR (RSV, Flu A&B, Covid) Anterior Nasal Swab     Status: None   Collection Time: 06/13/22  2:21 PM   Specimen: Anterior Nasal Swab  Result Value Ref Range   SARS Coronavirus 2 by RT PCR NEGATIVE NEGATIVE   Influenza A by PCR NEGATIVE NEGATIVE   Influenza B by PCR NEGATIVE NEGATIVE    Comment: (NOTE) The Xpert Xpress SARS-CoV-2/FLU/RSV plus assay is intended as an aid in the diagnosis of influenza from Nasopharyngeal swab specimens and should not be used as a sole basis for treatment. Nasal washings and aspirates are unacceptable for Xpert Xpress SARS-CoV-2/FLU/RSV testing.  Fact Sheet for Patients: BloggerCourse.com  Fact Sheet for Healthcare Providers: SeriousBroker.it  This test is not yet approved or cleared by the [place] FDA and has been authorized for detection and/or diagnosis of SARS-CoV-2 by FDA under an Emergency Use Authorization (EUA). This EUA will remain in effect (meaning this test can be used) for the duration of the COVID-19 declaration under Section 564(b)(1) of the Act, 21 U.S.C. section 360bbb-3(b)(1), unless the authorization is terminated or revoked.     Resp Syncytial Virus by PCR NEGATIVE NEGATIVE    Comment: (NOTE) Fact Sheet for Patients: BloggerCourse.com  Fact Sheet for Healthcare Providers: SeriousBroker.it  This test is not yet approved or cleared by the [place] FDA and has been authorized for detection and/or diagnosis of SARS-CoV-2 by FDA under an Emergency Use  Authorization (EUA). This EUA will remain in effect (meaning this test can be used) for the duration of the COVID-19 declaration under Section 564(b)(1) of the Act, 21 U.S.C. section 360bbb-3(b)(1), unless the authorization is terminated or revoked.  Performed at [facility], [address], [place], [place] [address]   Basic metabolic panel     Status: Abnormal    Collection Time: 06/13/22  2:41 PM  Result Value Ref Range   Sodium 131 (L) 135 - 145 mmol/L   Potassium 4.4 3.5 - 5.1 mmol/L   Chloride 96 (L) 98 - 111 mmol/L   CO2 23 22 - 32 mmol/L   Glucose, Bld 232 (H) 70 - 99 mg/dL    Comment: Glucose reference range applies only to samples taken after fasting for at least 8 hours.   BUN 14 8 - 23 mg/dL   Creatinine, Ser 6.57 0.44 - 1.00 mg/dL   Calcium 8.5 (L) 8.9 - 10.3 mg/dL   GFR, Estimated >84 >69 mL/min    Comment: (NOTE) Calculated using the CKD-EPI Creatinine Equation (2021)    Anion gap 12 5 - 15    Comment: Performed at [facility], [address], [place], [place] [address]  CBC     Status: Abnormal   Collection Time: 06/13/22  2:41 PM  Result Value Ref Range   WBC 20.4 (H) 4.0 - 10.5 K/uL   RBC 3.10 (L) 3.87 - 5.11 MIL/uL   Hemoglobin 9.9 (L) 12.0 - 15.0 g/dL   HCT 84.1 (L) 32.4 - 40.1 %   MCV 94.8 80.0 - 100.0 fL   MCH 31.9 26.0 - 34.0 pg   MCHC 33.7 30.0 - 36.0 g/dL   RDW 02.7 25.3 - 66.4 %   Platelets 701 (H) 150 - 400 K/uL   nRBC 0.0 0.0 - 0.2 %    Comment: Performed at [facility], [address] [place], [place] [address]  Urinalysis, Routine w reflex microscopic -Urine, Clean Catch     Status: Abnormal   Collection Time: 06/13/22  5:24 PM  Result Value Ref Range   Color, Urine AMBER (A) YELLOW    Comment: BIOCHEMICALS MAY BE AFFECTED BY COLOR   APPearance CLOUDY (A) CLEAR   Specific Gravity, Urine 1.019 1.005 - 1.030   pH 5.0 5.0 - 8.0   Glucose, UA NEGATIVE NEGATIVE mg/dL   Hgb urine dipstick SMALL (A) NEGATIVE   Bilirubin Urine NEGATIVE NEGATIVE   Ketones, ur NEGATIVE NEGATIVE mg/dL   Protein, ur 425 (A) NEGATIVE mg/dL   Nitrite NEGATIVE NEGATIVE   Leukocytes,Ua MODERATE (A) NEGATIVE   RBC / HPF 6-10 0 - 5 RBC/hpf   WBC, UA 11-20 0 - 5 WBC/hpf   Bacteria, UA RARE (A) NONE SEEN   Squamous Epithelial / HPF 6-10 0 - 5 /HPF   Mucus PRESENT    Hyaline Casts, UA PRESENT     Comment: Performed  at [facility], [address], [place], [place] [address]  Lactic acid, plasma     Status: None   Collection Time: 06/13/22  6:36 PM  Result Value Ref Range   Lactic Acid, Venous 1.5 0.5 - 1.9 mmol/L    Comment: Performed at [facility], [address], [place], [place] [address]   CT ABDOMEN PELVIS W CONTRAST  Result Date: 06/13/2022 CLINICAL DATA:  Unintended weight loss. Elevated white count. Weakness. EXAM: CT ABDOMEN AND PELVIS WITH CONTRAST TECHNIQUE: Multidetector CT imaging of the  abdomen and pelvis was performed using the standard protocol following bolus administration of intravenous contrast. RADIATION DOSE REDUCTION: This exam was performed according to the departmental dose-optimization program which includes automated exposure control, adjustment of the mA and/or kV according to patient size and/or use of iterative reconstruction technique. CONTRAST:  75mL OMNIPAQUE IOHEXOL 350 MG/ML SOLN COMPARISON:  Chest radiography same day FINDINGS: Lower chest: Presumed linear scars in both lower lobes. Linear atelectasis less likely. No pleural effusion. Hepatobiliary: Liver parenchyma is normal. Previous cholecystectomy. Pancreas: Normal Spleen: Normal Adrenals/Urinary Tract: Adrenal glands are normal. Kidneys are normal. No mass, stone or hydronephrosis. Retroperitoneal phleboliths on the right are adjacent to the ureter. No ureteral stone. No stone in the bladder. Stomach/Bowel: Stomach and small intestine are normal. Normal appearance of the colon. No evidence of obstruction or bowel inflammation. No sign of mass lesion. Vascular/Lymphatic: Aortic atherosclerosis. No aneurysm. IVC is normal. No adenopathy. Reproductive: Previous hysterectomy.  No pelvic mass. Other: No free fluid or air.  No hernia. Musculoskeletal: Ordinary spinal degenerative changes are noted. IMPRESSION: 1. No abnormality seen to explain the clinical presentation. 2. Previous cholecystectomy and hysterectomy. 3.  Aortic atherosclerosis. 4. Presumed linear scars in both lower lobes. Linear atelectasis less likely. Aortic Atherosclerosis (ICD10-I70.0). Electronically Signed   By: Paulina Fusi M.D.   On: 06/13/2022 18:12   DG Chest 2 View  Result Date: 06/13/2022 CLINICAL DATA:  prolonged weakness EXAM: CHEST - 2 VIEW COMPARISON:  August 29, 2018 FINDINGS: The cardiomediastinal silhouette is unchanged in contour. No pleural effusion. No pneumothorax. No acute pleuroparenchymal abnormality. Visualized abdomen is unremarkable. Multilevel degenerative changes of the thoracic spine. IMPRESSION: No acute cardiopulmonary abnormality. Electronically Signed   By: Meda Klinefelter M.D.   On: 06/13/2022 17:22    Pending Labs Unresulted Labs (From admission, onward)     Start     Ordered   06/13/22 2029  Technologist smear review  (Technologist smear review)  Add-on,   AD       Question:  Clinical information:  Answer:  Zebedee Iba, weight loss, unexplained leukocytosis/thrombocytosis   06/13/22 2028   06/13/22 2029  Differential  (Technologist smear review)  Add-on,   AD        06/13/22 2028   06/13/22 1836  Lactic acid, plasma  Now then every 2 hours,   R (with STAT occurrences)      06/13/22 1835   06/13/22 1836  Culture, blood (routine x 2)  BLOOD CULTURE X 2,   R (with STAT occurrences)      06/13/22 1835   06/13/22 1541  TSH  Add-on,   AD        06/13/22 1540            Vitals/Pain Today's Vitals   06/13/22 1418 06/13/22 1420 06/13/22 1838  BP:  (!) 99/56 (!) 104/53  Pulse:  95 90  Resp:  16 16  Temp:  98.3 F (36.8 C) 98.6 F (37 C)  TempSrc:  Oral   SpO2:  100% 100%  PainSc: 10-Worst pain ever      Isolation Precautions No active isolations  Medications Medications  sodium chloride 0.9 % bolus 500 mL (0 mLs Intravenous Stopped 06/13/22 1902)  iohexol (OMNIPAQUE) 350 MG/ML injection 75 mL (75 mLs Intravenous Contrast Given 06/13/22 1804)  sodium chloride 0.9 % bolus 500 mL (0 mLs Intravenous  Stopped 06/13/22 2015)    Mobility walks     Focused Assessments    R Recommendations: See Admitting Provider Note  Report given to:   Additional Notes:  Does need interpretor to communicate.

## 2022-06-13 NOTE — H&P (Signed)
Hospital Admission History and Physical Service Pager: [phone]  Patient name: Brittany Pineda Medical record number: [id number] Date of Birth: Apr 02, 1955 Age: 67 y.o. Gender: female  Primary Care Provider: Shelby Mattocks, DO Consultants: GI Code Status: FULL  Preferred Emergency Contact: Daughter Miquel Dunn [phone]  Chief Complaint: Fatigue, unintentional weight loss, poor p.o. intake, black tarry stools  Video Spanish interpreter used for entire HPI  Assessment and Plan: Brittany Pineda is a 67 y.o. female presenting with 2 weeks of fatigue, unintentional weight loss, poor p.o. intake, black tarry stools . Differential for this patient's presentation of this includes malignancy, GI bleed, iron deficiency anemia, DKA or other diabetic complication.    * Weakness Differential is broad, but at this juncture I am most concerned about malignancy versus GI bleed, although infectious etiology is not off the table.  Given patient is afebrile at this time without overt clinical signs of infection, will hold off on starting antibiotics at this time, low threshold to start. Imaging reassuring against tumor or other masses.  - Admit to family medicine teaching service, Dr. Manson Passey attending - Will follow-up urine and blood cultures - Follow-up CK - PT OT eval and treat - A.m. CBC, BMP  Possible GI bleed workup: - GI Dr. Barron Alvine consulted, appreciate evaluation - Add on TIBC, iron, ferritin, B12, and folate labs to those are to collected in ED - A.m. CBC - N.p.o. at midnight in case GI wishes to scope in the morning  Possible malignancy workup: - Add on CBC differential, peripheral smear, PT/INR, APTT  Essential hypertension At home takes amlodipine 10 mg and losartan 25 mg.  Given low-normal pressures in setting of poor p.o. intake and fatigue, will hold home antihypertensives for now. - NS bolus followed by maintenance  fluids  Hypothyroidism Continue home levothyroxine without changes at this time.  TSH earlier this week just above 5.  Controlled type 2 diabetes mellitus without complication, without long-term current use of insulin (HCC) At home takes metformin 850 mg twice daily per chart.  Given last A1c earlier this week showed well-controlled diabetes, will simply hold metformin.  No need for 3 times daily glucose checks or insulin at this time.  Will monitor glucose on daily metabolic panels.  If indicated, will restart glycemic control.  Hyperlipidemia Will hold statin at this time given fatigue and malaise.  Plan to restart on discharge.   FEN/GI: Full diet, n.p.o. at midnight in case GI would like to scope Sunday VTE Prophylaxis: SCDs given black tarry stools and anemia  Disposition: MedSurg  History of Present Illness:  Brittany Pineda is a 67 y.o. female presenting with 2 weeks of fatigue.  With by PCP 4/29 for fatigue, generalized bodyaches.  At that time was noted to have about 16 pounds of weight loss since her last clinic visit over a year ago.  At that visit, A1c 6.2, hemoglobin 10.7, WBC 14.0, PLT 490, TSH 5.830, HIV negative albumin 3.7 lipid panel WNL.  Urine studies show moderately elevated Mycobutin creatinine ratio consistent with known diabetes.  UA dipstick showed derangements including moderate bilirubin, moderate ketones, small blood, 100 protein, 4.0 urobilinogen, and leukocytes.  On intake exam, patient reports poor p.o. intake due to fatigue and malaise.  No nausea, vomiting, or stomach pains.  Denies night sweats. Endorses black tarry stools, cannot tell me how long they have been going on.  Global pruritus with some areas of patchy pigmentation over chest and abdomen.  In the ED,  blood pressure has been low normal, patient received 1L NS bolus. Imaging thus far unremarkable including CXR and CT abd pelvis with contrast. Labs show slightly worsened anemia with  hemoglobin 9.9, WBC 20.4, platelets 701.  Review Of Systems: Per HPI with the following additions: None  Pertinent Past Medical History: Hypertension Type 2 diabetes Hyperlipidemia Hypothyroidism Remainder reviewed in history tab.   Pertinent Past Surgical History: Cholecystectomy Abdominal hysterectomy Remainder reviewed in history tab.   Pertinent Family History: None per patient Remainder reviewed in history tab.   Important Outpatient Medications: Amlodipine 10 mg Levothyroxine 35 mcg Losartan 25 mg Metformin 850 mg twice daily Simvastatin 40 mg Remainder reviewed in medication history.   Objective: BP (!) 110/54 (BP Location: Right Arm)   Pulse 98   Temp 99.8 F (37.7 C) (Oral)   Resp 16   SpO2 100%  Exam: General: Withdrawn, soft-spoken, appears tired Eyes: Sclera anicteric and noninjected, conjunctival pallor ENTM: Mouth somewhat tacky, lips dry Neck: No palpable lymphadenopathy Cardiovascular: Regular rhythm, slightly tachycardic rate in the low 100s Respiratory: Clear to auscultation Gastrointestinal: Hypoactive bowel sounds, no TTP in any quadrant, slight guarding over suprapubic area, patient reports simply need to urinate Derm: Patient reveals "rashes" which appear to be 6 inch somewhat ovular areas of pronounced follicular hyperpigmentation without erythema, raised texture, swelling, or other abnormality Psych: Quiet and soft spoken  Labs:  CBC BMET  Recent Labs  Lab 06/13/22 1441  WBC 20.4*  HGB 9.9*  HCT 29.4*  PLT 701*   Recent Labs  Lab 06/13/22 1441  NA 131*  K 4.4  CL 96*  CO2 23  BUN 14  CREATININE 0.87  GLUCOSE 232*  CALCIUM 8.5*    Pertinent additional labs: Respiratory panel negative for COVID, flu, RSV UA with 100 protein, moderate leukocytes, rare bacteria Protein creatinine ratio elevated at 0.55 Lactic acid 1.5  EKG: Normal sinus rhythm, QTc 408, no ST elevation  Imaging Studies Performed: CXR  FINDINGS: The  cardiomediastinal silhouette is unchanged in contour. No pleural effusion. No pneumothorax. No acute pleuroparenchymal abnormality. Visualized abdomen is unremarkable. Multilevel degenerative changes of the thoracic spine. IMPRESSION: No acute cardiopulmonary abnormality.  CT abd pelvis with contrast IMPRESSION: 1. No abnormality seen to explain the clinical presentation. 2. Previous cholecystectomy and hysterectomy. 3. Aortic atherosclerosis. 4. Presumed linear scars in both lower lobes. Linear atelectasis less likely.   Fayette Pho, MD 06/13/2022, 9:53 PM PGY-3, [place] [facility] FPTS Intern pager: [phone], text pages welcome Secure chat group [facility] Teaching Service

## 2022-06-13 NOTE — Progress Notes (Signed)
Family medicine teaching service will be admitting this patient. Our pager information can be located in the physician sticky notes, treatment team sticky notes, and the headers of all our official daily progress notes.   FAMILY MEDICINE TEACHING SERVICE Patient - Please contact intern pager [phone] or text page via website [email or web address] (login: [email or web address]) for questions regarding care. DO NOT page listed attending provider unless there is no answer from the number above.   Andie Mortimer, MD PGY-3, [facility] Service pager [phone]   

## 2022-06-13 NOTE — ED Triage Notes (Addendum)
Pt arrived POV from home c/o generalized weakness, fatigue or eating well x8 days. Pt also endorses body aches and chills.

## 2022-06-13 NOTE — Assessment & Plan Note (Addendum)
Still soft BP. At home takes amlodipine 10 mg and losartan 25 mg. - Continue to hold home meds

## 2022-06-13 NOTE — Assessment & Plan Note (Addendum)
Crestor 20 mg daily. -Repeat lipid panel ~2 months

## 2022-06-14 ENCOUNTER — Encounter (HOSPITAL_COMMUNITY): Payer: Self-pay | Admitting: Student

## 2022-06-14 ENCOUNTER — Observation Stay (HOSPITAL_BASED_OUTPATIENT_CLINIC_OR_DEPARTMENT_OTHER): Payer: Self-pay

## 2022-06-14 DIAGNOSIS — D62 Acute posthemorrhagic anemia: Secondary | ICD-10-CM

## 2022-06-14 DIAGNOSIS — R630 Anorexia: Secondary | ICD-10-CM

## 2022-06-14 DIAGNOSIS — R634 Abnormal weight loss: Secondary | ICD-10-CM

## 2022-06-14 DIAGNOSIS — R011 Cardiac murmur, unspecified: Secondary | ICD-10-CM

## 2022-06-14 DIAGNOSIS — A419 Sepsis, unspecified organism: Secondary | ICD-10-CM | POA: Diagnosis present

## 2022-06-14 DIAGNOSIS — D649 Anemia, unspecified: Secondary | ICD-10-CM | POA: Insufficient documentation

## 2022-06-14 LAB — CBC
HCT: 25.9 % — ABNORMAL LOW (ref 36.0–46.0)
HCT: 26.2 % — ABNORMAL LOW (ref 36.0–46.0)
Hemoglobin: 8.9 g/dL — ABNORMAL LOW (ref 12.0–15.0)
Hemoglobin: 8.9 g/dL — ABNORMAL LOW (ref 12.0–15.0)
MCH: 32 pg (ref 26.0–34.0)
MCH: 31.9 pg (ref 26.0–34.0)
MCHC: 34.4 g/dL (ref 30.0–36.0)
MCHC: 34 g/dL (ref 30.0–36.0)
MCV: 93.2 fL (ref 80.0–100.0)
MCV: 93.9 fL (ref 80.0–100.0)
Platelets: 723 10*3/uL — ABNORMAL HIGH (ref 150–400)
Platelets: 680 10*3/uL — ABNORMAL HIGH (ref 150–400)
RBC: 2.78 MIL/uL — ABNORMAL LOW (ref 3.87–5.11)
RBC: 2.79 MIL/uL — ABNORMAL LOW (ref 3.87–5.11)
RDW: 11.9 % (ref 11.5–15.5)
RDW: 12.1 % (ref 11.5–15.5)
WBC: 21.5 10*3/uL — ABNORMAL HIGH (ref 4.0–10.5)
WBC: 20.5 10*3/uL — ABNORMAL HIGH (ref 4.0–10.5)
nRBC: 0 % (ref 0.0–0.2)
nRBC: 0 % (ref 0.0–0.2)

## 2022-06-14 LAB — COMPREHENSIVE METABOLIC PANEL
ALT: 22 U/L (ref 0–44)
AST: 31 U/L (ref 15–41)
Albumin: 1.8 g/dL — ABNORMAL LOW (ref 3.5–5.0)
Alkaline Phosphatase: 92 U/L (ref 38–126)
Anion gap: 10 (ref 5–15)
BUN: 10 mg/dL (ref 8–23)
CO2: 22 mmol/L (ref 22–32)
Calcium: 7.9 mg/dL — ABNORMAL LOW (ref 8.9–10.3)
Chloride: 101 mmol/L (ref 98–111)
Creatinine, Ser: 0.67 mg/dL (ref 0.44–1.00)
GFR, Estimated: >60 mL/min (ref >60)
Glucose, Bld: 178 mg/dL — ABNORMAL HIGH (ref 70–99)
Potassium: 4.1 mmol/L (ref 3.5–5.1)
Sodium: 133 mmol/L — ABNORMAL LOW (ref 135–145)
Total Bilirubin: 0.4 mg/dL (ref 0.3–1.2)
Total Protein: 6 g/dL — ABNORMAL LOW (ref 6.5–8.1)

## 2022-06-14 LAB — CULTURE, BLOOD (ROUTINE X 2)
Culture: NO GROWTH
Special Requests: ADEQUATE

## 2022-06-14 LAB — DIFFERENTIAL
Abs Immature Granulocytes: 0.19 10*3/uL — ABNORMAL HIGH (ref 0.00–0.07)
Basophils Absolute: 0.1 10*3/uL (ref 0.0–0.1)
Basophils Relative: 1 %
Eosinophils Absolute: 1.3 10*3/uL — ABNORMAL HIGH (ref 0.0–0.5)
Eosinophils Relative: 6 %
Immature Granulocytes: 1 %
Lymphocytes Relative: 4 %
Lymphs Abs: 0.9 10*3/uL (ref 0.7–4.0)
Monocytes Absolute: 1.4 10*3/uL — ABNORMAL HIGH (ref 0.1–1.0)
Monocytes Relative: 6 %
Neutro Abs: 17.6 10*3/uL — ABNORMAL HIGH (ref 1.7–7.7)
Neutrophils Relative %: 82 %

## 2022-06-14 LAB — VITAMIN B12: Vitamin B-12: 166 pg/mL — ABNORMAL LOW (ref 180–914)

## 2022-06-14 LAB — RETICULOCYTES
Immature Retic Fract: 22 % — ABNORMAL HIGH (ref 2.3–15.9)
RBC.: 2.73 MIL/uL — ABNORMAL LOW (ref 3.87–5.11)
Retic Count, Absolute: 34.1 10*3/uL (ref 19.0–186.0)
Retic Ct Pct: 1.3 % (ref 0.4–3.1)

## 2022-06-14 LAB — TSH: TSH: 7.748 u[IU]/mL — ABNORMAL HIGH (ref 0.350–4.500)

## 2022-06-14 LAB — TECHNOLOGIST SMEAR REVIEW: Plt Morphology: INCREASED

## 2022-06-14 LAB — IRON AND TIBC
Iron: 9 ug/dL — ABNORMAL LOW (ref 28–170)
Saturation Ratios: 9 % — ABNORMAL LOW (ref 10.4–31.8)
TIBC: 105 ug/dL — ABNORMAL LOW (ref 250–450)
UIBC: 96 ug/dL

## 2022-06-14 LAB — FERRITIN: Ferritin: 430 ng/mL — ABNORMAL HIGH (ref 11–307)

## 2022-06-14 LAB — ECHOCARDIOGRAM COMPLETE
Area-P 1/2: 3.17 cm2
S' Lateral: 2.5 cm

## 2022-06-14 LAB — CK: Total CK: 8 U/L — ABNORMAL LOW (ref 38–234)

## 2022-06-14 LAB — FOLATE: Folate: 9.1 ng/mL (ref >5.9)

## 2022-06-14 LAB — PROTIME-INR
INR: 1.3 — ABNORMAL HIGH (ref 0.8–1.2)
Prothrombin Time: 15.7 seconds — ABNORMAL HIGH (ref 11.4–15.2)

## 2022-06-14 LAB — APTT: aPTT: 40 seconds — ABNORMAL HIGH (ref 24–36)

## 2022-06-14 MED ORDER — LEVOTHYROXINE SODIUM 88 MCG PO TABS
88.0000 ug | ORAL_TABLET | Freq: Every day | ORAL | Status: DC
  Administered 2022-06-14 – 2022-06-19 (×5): 88 ug via ORAL
  Filled 2022-06-14 (×5): qty 1

## 2022-06-14 MED ORDER — SODIUM CHLORIDE 0.9 % IV SOLN
2.0000 g | Freq: Two times a day (BID) | INTRAVENOUS | Status: DC
  Administered 2022-06-14 – 2022-06-15 (×3): 2 g via INTRAVENOUS
  Filled 2022-06-14 (×3): qty 12.5

## 2022-06-14 MED ORDER — FERROUS GLUCONATE 324 (38 FE) MG PO TABS
324.0000 mg | ORAL_TABLET | Freq: Every day | ORAL | Status: DC
  Administered 2022-06-16 – 2022-06-19 (×3): 324 mg via ORAL
  Filled 2022-06-14 (×6): qty 1

## 2022-06-14 MED ORDER — PANTOPRAZOLE SODIUM 40 MG IV SOLR
40.0000 mg | Freq: Every day | INTRAVENOUS | Status: DC
  Administered 2022-06-14 – 2022-06-18 (×5): 40 mg via INTRAVENOUS
  Filled 2022-06-14 (×6): qty 10

## 2022-06-14 MED ORDER — PANTOPRAZOLE SODIUM 40 MG IV SOLR: 40.0000 mg | Freq: Two times a day (BID) | INTRAVENOUS | Status: DC

## 2022-06-14 MED ORDER — ACETAMINOPHEN 325 MG PO TABS
650.0000 mg | ORAL_TABLET | Freq: Four times a day (QID) | ORAL | Status: DC | PRN
  Administered 2022-06-14: 650 mg via ORAL
  Filled 2022-06-14 (×2): qty 2

## 2022-06-14 MED ORDER — CYANOCOBALAMIN 1000 MCG/ML IJ SOLN
1000.0000 ug | Freq: Once | INTRAMUSCULAR | Status: AC
  Administered 2022-06-14: 1000 ug via INTRAMUSCULAR
  Filled 2022-06-14: qty 1

## 2022-06-14 MED ORDER — VANCOMYCIN HCL IN DEXTROSE 1-5 GM/200ML-% IV SOLN
1000.0000 mg | INTRAVENOUS | Status: DC
  Administered 2022-06-14 – 2022-06-15 (×2): 1000 mg via INTRAVENOUS
  Filled 2022-06-14 (×2): qty 200

## 2022-06-14 NOTE — Evaluation (Signed)
Physical Therapy Evaluation Patient Details Name: Brittany Pineda MRN: [id number] DOB: [birth year] Today's Date: 06/14/2022  History of Present Illness  67 y.o. female presenting to the ED 5/4 with report of 2 weeks of fatigue, unintentional weight loss, poor p.o. intake, black tarry stools. Pt admitted for further testing. PMH: HTN, DMII, hypothyroidism   Clinical Impression  Pt admitted with above diagnosis. PTA pt lived alone, independent and driving. Pt currently with functional limitations due to the deficits listed below (see PT Problem List). On eval, she required min guard assist transfers, and min assist amb 20' without AD. She demo very labored, guarded gait due to BLE pain. Pt will benefit from acute skilled PT to increase their independence and safety with mobility to allow discharge.  Pt reports no caregiver support at home. Therefore, recommend post acute rehab, <3 hours/day.        Recommendations for follow up therapy are one component of a multi-disciplinary discharge planning process, led by the attending physician.  Recommendations may be updated based on patient status, additional functional criteria and insurance authorization.  Follow Up Recommendations Can patient physically be transported by private vehicle: Yes     Assistance Recommended at Discharge Intermittent Supervision/Assistance  Patient can return home with the following  A little help with bathing/dressing/bathroom;Assistance with cooking/housework;Assist for transportation;Help with stairs or ramp for entrance    Equipment Recommendations Rolling walker (2 wheels)  Recommendations for Other Services       Functional Status Assessment Patient has had a recent decline in their functional status and demonstrates the ability to make significant improvements in function in a reasonable and predictable amount of time.     Precautions / Restrictions Precautions Precautions: Fall       Mobility  Bed Mobility Overal bed mobility: Modified Independent                  Transfers Overall transfer level: Needs assistance Equipment used: None Transfers: Sit to/from Stand Sit to Stand: Min guard           General transfer comment: increased time to power up    Ambulation/Gait Ambulation/Gait assistance: Min assist Gait Distance (Feet): 20 Feet Assistive device: 1 person hand held assist Gait Pattern/deviations: Step-through pattern, Decreased stride length, Antalgic Gait velocity: decreased Gait velocity interpretation: <1.31 ft/sec, indicative of household ambulator   General Gait Details: very labored, guarded gait pattern due to BLE pain  Stairs            Wheelchair Mobility    Modified Rankin (Stroke Patients Only)       Balance Overall balance assessment: Mild deficits observed, not formally tested                                           Pertinent Vitals/Pain Pain Assessment Pain Assessment: 0-10 Pain Score: 8  Pain Location: BLE during amb Pain Descriptors / Indicators: Discomfort, Grimacing, Guarding Pain Intervention(s): Limited activity within patient's tolerance, Monitored during session, Repositioned    Home Living Family/patient expects to be discharged to:: Private residence Living Arrangements: Alone   Type of Home: Mobile home Home Access: Stairs to enter Entrance Stairs-Rails: None Entrance Stairs-Number of Steps: 4   Home Layout: One level Home Equipment: None      Prior Function Prior Level of Function : Independent/Modified Independent;Driving  Hand Dominance        Extremity/Trunk Assessment   Upper Extremity Assessment Upper Extremity Assessment: Defer to OT evaluation    Lower Extremity Assessment Lower Extremity Assessment: Generalized weakness    Cervical / Trunk Assessment Cervical / Trunk Assessment: Normal  Communication    Communication: Prefers language other than [place];Interpreter utilized (iPad interpreter-Uriel [phone])  Cognition Arousal/Alertness: Awake/alert Behavior During Therapy: Flat affect Overall Cognitive Status: Within Functional Limits for tasks assessed                                          General Comments General comments (skin integrity, edema, etc.): VSS on RA    Exercises     Assessment/Plan    PT Assessment Patient needs continued PT services  PT Problem List Decreased strength;Pain;Decreased mobility;Decreased activity tolerance       PT Treatment Interventions DME instruction;Functional mobility training;Balance training;Patient/family education;Gait training;Therapeutic activities;Therapeutic exercise;Stair training    PT Goals (Current goals can be found in the Care Plan section)  Acute Rehab PT Goals Patient Stated Goal: decrease pain PT Goal Formulation: With patient Time For Goal Achievement: 06/28/22 Potential to Achieve Goals: Good    Frequency Min 3X/week     Co-evaluation               AM-PAC PT "6 Clicks" Mobility  Outcome Measure Help needed turning from your back to your side while in a flat bed without using bedrails?: None Help needed moving from lying on your back to sitting on the side of a flat bed without using bedrails?: None Help needed moving to and from a bed to a chair (including a wheelchair)?: A Little Help needed standing up from a chair using your arms (e.g., wheelchair or bedside chair)?: A Little Help needed to walk in hospital room?: A Little Help needed climbing 3-5 steps with a railing? : A Lot 6 Click Score: 19    End of Session Equipment Utilized During Treatment: Gait belt Activity Tolerance: Patient limited by pain Patient left: in bed;with call bell/phone within reach;with bed alarm set Nurse Communication: Mobility status PT Visit Diagnosis: Muscle weakness (generalized) (M62.81);Difficulty in  walking, not elsewhere classified (R26.2);Pain Pain - part of body: Leg    Time: 0806-0820 PT Time Calculation (min) (ACUTE ONLY): 14 min   Charges:   PT Evaluation $PT Eval Moderate Complexity: 1 Mod          Ferd Glassing., PT  Office # [phone]   Ilda Foil 06/14/2022, 8:54 AM

## 2022-06-14 NOTE — Progress Notes (Signed)
Pharmacy Antibiotic Note  English Brittany Pineda is a 67 y.o. female admitted on 06/13/2022 with sepsis.  Pharmacy has been consulted for Vancomycin/Cefepime dosing. WBC is elevated, febrile.   Plan: Vancomycin 1000 mg IV q24h >>>Estimated AUC: 466 Cefepime 2g IV q12h Trend WBC, temp, renal function  F/U infectious work-up Drug levels as indicated   Temp (24hrs), Avg:99.4 F (37.4 C), Min:98.3 F (36.8 C), Max:100.7 F (38.2 C)  Recent Labs  Lab 06/08/22 1722 06/13/22 1441 06/13/22 1836 06/14/22 0129  WBC 14.0* 20.4*  --  21.5*  CREATININE 0.78 0.87  --  0.67  LATICACIDVEN  --   --  1.5  --     Estimated Creatinine Clearance: 58.8 mL/min (by C-G formula based on SCr of 0.67 mg/dL).    No Known Allergies  Abran Duke, PharmD, BCPS Clinical Pharmacist Phone: [phone]

## 2022-06-14 NOTE — Progress Notes (Signed)
Pt with temp 100.7, Fayette Pho MD, notified, New order received.

## 2022-06-14 NOTE — Evaluation (Signed)
Occupational Therapy Evaluation Patient Details Name: Brittany Pineda MRN: [id number] DOB: November 24, 1955 Today's Date: 06/14/2022   History of Present Illness 67 y.o. female presenting to the ED 5/4 with report of 2 weeks of fatigue, unintentional weight loss, poor p.o. intake, black tarry stools. Pt admitted for further testing. PMH: HTN, DMII, hypothyroidism   Clinical Impression   Pt reports having assist at baseline from daughter for IADLs but lives alone. Pt needing set up A -min A for ADLs, min guard for bed mobility, and min guard for transfers with RW. Pt limited due to bil feet pain and fatigue. Pt presenting with impairments listed below, will follow acutely. Patient will benefit from continued inpatient follow up therapy, <3 hours/day to maximize safety and independence with ADLs/functional mobility pending progression.       Recommendations for follow up therapy are one component of a multi-disciplinary discharge planning process, led by the attending physician.  Recommendations may be updated based on patient status, additional functional criteria and insurance authorization.   Assistance Recommended at Discharge Frequent or constant Supervision/Assistance  Patient can return home with the following A little help with walking and/or transfers;A lot of help with bathing/dressing/bathroom;Direct supervision/assist for medications management;Direct supervision/assist for financial management;Assist for transportation;Help with stairs or ramp for entrance    Functional Status Assessment  Patient has had a recent decline in their functional status and demonstrates the ability to make significant improvements in function in a reasonable and predictable amount of time.  Equipment Recommendations  BSC/3in1;Tub/shower seat    Recommendations for Other Services PT consult     Precautions / Restrictions Precautions Precautions: Fall Restrictions Weight Bearing  Restrictions: No      Mobility Bed Mobility Overal bed mobility: Needs Assistance Bed Mobility: Supine to Sit     Supine to sit: Min guard     General bed mobility comments: increased time    Transfers Overall transfer level: Needs assistance Equipment used: Rolling walker (2 wheels) Transfers: Sit to/from Stand Sit to Stand: Min guard           General transfer comment: increased time to power up      Balance Overall balance assessment: Mild deficits observed, not formally tested                                         ADL either performed or assessed with clinical judgement   ADL Overall ADL's : Needs assistance/impaired Eating/Feeding: Set up;Sitting   Grooming: Set up;Sitting   Upper Body Bathing: Minimal assistance;Sitting   Lower Body Bathing: Sitting/lateral leans;Moderate assistance   Upper Body Dressing : Minimal assistance;Sitting   Lower Body Dressing: Minimal assistance;Sitting/lateral leans   Toilet Transfer: Minimal assistance;Ambulation;Regular Toilet;Rolling walker (2 wheels)   Toileting- Clothing Manipulation and Hygiene: Min guard       Functional mobility during ADLs: Minimal assistance;Rolling walker (2 wheels)       Vision   Vision Assessment?: No apparent visual deficits     Perception Perception Perception Tested?: No   Praxis Praxis Praxis tested?: Not tested    Pertinent Vitals/Pain Pain Assessment Pain Assessment: Faces Pain Score: 7  Faces Pain Scale: Hurts whole lot Pain Location: BLE during amb Pain Descriptors / Indicators: Discomfort, Grimacing, Guarding Pain Intervention(s): Limited activity within patient's tolerance, Monitored during session, Repositioned     Hand Dominance     Extremity/Trunk Assessment Upper Extremity Assessment Upper  Extremity Assessment: Generalized weakness   Lower Extremity Assessment Lower Extremity Assessment: Defer to PT evaluation   Cervical / Trunk  Assessment Cervical / Trunk Assessment: Normal   Communication Communication Communication: Prefers language other than [place];Interpreter utilized Scientist, water quality interpreter Norva Pavlov [phone])   Cognition Arousal/Alertness: Awake/alert Behavior During Therapy: Flat affect Overall Cognitive Status: Within Functional Limits for tasks assessed                                       General Comments  VSS on RA    Exercises     Shoulder Instructions      Home Living Family/patient expects to be discharged to:: Private residence Living Arrangements: Alone Available Help at Discharge: Family Type of Home: Mobile home Home Access: Stairs to enter Secretary/administrator of Steps: 4 Entrance Stairs-Rails: None Home Layout: One level     Bathroom Shower/Tub: Chief Strategy Officer: Standard     Home Equipment: None          Prior Functioning/Environment Prior Level of Function : Independent/Modified Independent;Driving                        OT Problem List: Decreased strength;Decreased range of motion;Decreased activity tolerance;Impaired balance (sitting and/or standing)      OT Treatment/Interventions: Self-care/ADL training;Therapeutic exercise;Energy conservation;DME and/or AE instruction;Therapeutic activities;Patient/family education;Balance training    OT Goals(Current goals can be found in the care plan section) Acute Rehab OT Goals Patient Stated Goal: none stated OT Goal Formulation: With patient Time For Goal Achievement: 06/28/22 Potential to Achieve Goals: Good ADL Goals Pt Will Perform Upper Body Dressing: with supervision;sitting Pt Will Perform Lower Body Dressing: with supervision;sitting/lateral leans;sit to/from stand Pt Will Transfer to Toilet: with supervision;ambulating;regular height toilet Pt Will Perform Tub/Shower Transfer: Tub transfer;Shower transfer;with supervision;ambulating Additional ADL Goal #1: pt will  verbalize 3 energy conservation strategies in prep for ADLs  OT Frequency: Min 1X/week    Co-evaluation              AM-PAC OT "6 Clicks" Daily Activity     Outcome Measure Help from another person eating meals?: A Little Help from another person taking care of personal grooming?: A Little Help from another person toileting, which includes using toliet, bedpan, or urinal?: A Little Help from another person bathing (including washing, rinsing, drying)?: A Lot Help from another person to put on and taking off regular upper body clothing?: A Little Help from another person to put on and taking off regular lower body clothing?: A Little 6 Click Score: 17   End of Session Equipment Utilized During Treatment: Rolling walker (2 wheels) Nurse Communication: Mobility status  Activity Tolerance: Patient tolerated treatment well Patient left: in bed;with call bell/phone within reach;with bed alarm set;with family/visitor present  OT Visit Diagnosis: Unsteadiness on feet (R26.81);Other abnormalities of gait and mobility (R26.89);Muscle weakness (generalized) (M62.81)                Time: 0454-0981 OT Time Calculation (min): 15 min Charges:  OT General Charges $OT Visit: 1 Visit OT Evaluation $OT Eval Low Complexity: 1 Low  Carver Fila, OTD, OTR/L SecureChat Preferred Acute Rehab [phone]   Carver Fila Koonce 06/14/2022, 10:43 AM

## 2022-06-14 NOTE — Assessment & Plan Note (Signed)
-   2 Large bore IV - PPI BID - GI consult - Monitor Hgb

## 2022-06-14 NOTE — Progress Notes (Signed)
  Echocardiogram 2D Echocardiogram has been performed.  Delcie Roch 06/14/2022, 3:38 PM

## 2022-06-14 NOTE — Progress Notes (Signed)
Daily Progress Note Intern Pager: [phone]  Patient name: Brittany Pineda Medical record number: [id number] Date of birth: 10-21-1955 Age: 67 y.o. Gender: female  Primary Care Provider: Shelby Mattocks, DO Consultants: GI Code Status: FULL  Pt Overview and Major Events to Date:  06/13/22: Admitted to FPTS   Assessment and Plan:  Brittany Pineda is a 67 y.o. female p/f myriad of symptoms with weight loss, fatigue, tarry stools, poor PO.    * Weakness Imaging thus far unrevealing. CK normal, no obvious weakness on exam. Febrile overnight, broad spectrum abx ordered with bcx. Steep drop in Hgb with symptoms pointing concern for GI etiology. Ddx: infectious, malignancy, GI bleed, autoimmune, psychiatric although very unlikely, vitamin deficiency.  - Ucx and Bcx - Fever curve  - Broad spec abx  - CBC in PM - GI to see, appreciate assistance - NPO until verifying that she will not have a procedure today - RPR and HIV    Essential hypertension At home takes amlodipine 10 mg and losartan 25 mg. Holding due to soft pressures.  - mIVF  Hypothyroidism Increase home dosage to bring TSH within range.   Controlled type 2 diabetes mellitus without complication, without long-term current use of insulin (HCC) Well controlled on metformin at home, holding while inpatient. CBG this AM 178. Trying to decrease stick burden without routine CBGs throughout the day but may need to change this. NPO currently.  - Anticipate need for TID CBGs - Restart home metformin at dispo  Hyperlipidemia Continue hold statin, general muscle pain with normal CK.    Language Barrier: Ensure always has interpreter with any discussions   Concern for GI bleed:  -Consider IV PPI -2 large bore IV -PM CBC -Transfuse <8 -NPO for now       FEN/GI: NPO PPx: SCDs>concern for active bleed  Dispo:Pending PT recommendations  pending clinical improvement . Barriers include work  up.   Subjective:  No current hematochezia or hematemesis. No sexual partners. Patient also endorses continued muscle pain and fatigue. No other reports since admission.   Objective: Temp:  [98.3 F (36.8 C)-100.7 F (38.2 C)] 99.6 F (37.6 C) (05/05 0800) Pulse Rate:  [90-98] 92 (05/05 0800) Resp:  [16] 16 (05/04 1838) BP: (99-113)/(51-56) 113/51 (05/05 0800) SpO2:  [96 %-100 %] 96 % (05/05 0800) General: Alert and oriented in no apparent distress; appears fatigued, resting in bed  Heart: Regular rate and rhythm with no murmurs appreciated Lungs: CTA bilaterally, no wheezing Abdomen: Bowel sounds present, no abdominal pain Skin: Warm and dry Extremities: No lower extremity edema, pain with palpation in thighs   Laboratory: Most recent CBC Lab Results  Component Value Date   WBC 21.5 (H) 06/14/2022   HGB 8.9 (L) 06/14/2022   HCT 25.9 (L) 06/14/2022   MCV 93.2 06/14/2022   PLT 723 (H) 06/14/2022   Most recent BMP    Latest Ref Rng & Units 06/14/2022    1:29 AM  BMP  Glucose 70 - 99 mg/dL 147   BUN 8 - 23 mg/dL 10   Creatinine 8.29 - 1.00 mg/dL 5.62   Sodium 130 - 865 mmol/L 133   Potassium 3.5 - 5.1 mmol/L 4.1   Chloride 98 - 111 mmol/L 101   CO2 22 - 32 mmol/L 22   Calcium 8.9 - 10.3 mg/dL 7.9     Alfredo Martinez, MD 06/14/2022, 9:34 AM  PGY-2, [facility] FPTS Intern pager: [phone], text pages welcome Secure chat  group [facility] Teaching Service

## 2022-06-14 NOTE — Consult Note (Addendum)
Attending physician's note   I have taken a history, reviewed the chart, and examined the patient. I performed a substantive portion of this encounter, including complete performance of at least one of the key components, in conjunction with the APP. I agree with the APP's note, impression, and recommendations with my edits.   67 year old female with medical history as outlined below, presents with 2-week history of fatigue, decreased p.o. intake.  Reports progressive 20# wt loss over the last year or so, but denies melena or hematochezia.  No abdominal pain, nausea/vomiting.  No previous EGD/colonoscopy.  Admission evaluation notable for fever and diagnosed with UTI - H/H 8.9/26 (baseline Hgb ~11) - TIBC 21, plt 723 - TSH 7.7 - B12 166, normal folate - Ferritin 430, iron 9, TIBC 105, sat 9% - Heme-negative stool - CT: No acute intra-abdominal pathology. 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  Some discrepancy in the notes regarding whether or not she has been having bleeding.  Initially reported as melena, but then denies any dark stools on my evaluation.  Nonetheless, will require endoscopic evaluation at some point.  Discussed role/utility of inpatient EGD/colonoscopy vs deferring to outpatient.  Will continue monitoring and if prolonged hospital course, can potentially get it done while she is here. - Continue serial CBC checks with blood transfusion as needed per protocol - Start B12 - Will benefit from repeat iron studies as an outpatient.  Suspect some component of ferritin increase as acute phase reactant - Will reassess tomorrow whether or not to perform EGD/colonoscopy as inpatient vs outpatient based on recovery from infection  4) UTI - Antimicrobial therapy per primary Medicine service   GI service will continue to follow.  Dr. Rhea Belton will  assume her inpatient GI care starting tomorrow morning, 06/15/2022.  [address], [place], [place] [phone] office         Consultation  Referring Provider: Family medicine service/Brown MD Primary Care Physician:  Shelby Mattocks, DO Primary Gastroenterologist: None, unassigned  Reason for Consultation: Weakness, anemia, question of dark stools  HPI: Brittany Pineda is a 67 y.o. non-English-speaking Hispanic female who was admitted through the emergency room yesterday after she presented with 2-week history of progressive fatigue and weakness.  She has apparently had a 20 pound weight loss over the past year.  There is some discrepancy in history regarding black stools.  Initial notes relate that she was having tarry stools however on my conversation today with her via the virtual interpreter she says she was having slimy appearing stools at home but not black.  Not seeing any bright red blood. She has no complaints of abdominal pain, no nausea or vomiting, appetite has been okay, no heartburn or indigestion no dysphagia or odynophagia.  No recent changes in her bowel habits. She has not had any prior GI evaluation. She does have family history of colon cancer in her grandmother. No regular aspirin or NSAID use, no EtOH.  Admission workup with negative chest x-ray, CT of the abdomen pelvis shows her to be status postcholecystectomy and hysterectomy normal-appearing liver and otherwise negative exam. BBC 20.4/hemoglobin 9.9/hematocrit 29.4/platelets 701 BUN 14/creatinine 0.87 Lactate 1.5 UA is positive, culture pending Labs today-ferritin 430 Hemoglobin 8.9/hematocrit 25.9 Serum iron 9/TIBC 105/iron sat 9 Folate 9.1/B12 low at 166 CK8/INR 1.3  She was febrile this morning to 100.7, has just been started on  vancomycin and cefepime   Past Medical History:  Diagnosis Date   Diabetes mellitus    Hyperlipidemia    Hypertension    Thyroid disease     Past Surgical  History:  Procedure Laterality Date   ABDOMINAL HYSTERECTOMY     CHOLECYSTECTOMY      Prior to Admission medications   Medication Sig Start Date End Date Taking? Authorizing Provider  amLODipine (NORVASC) 10 MG tablet TAKE 1 TABLET BY MOUTH AT BEDTIME 12/29/21   Shelby Mattocks, DO  Blood Glucose Monitoring Suppl (AGAMATRIX PRESTO) w/Device KIT Use as instructed for PrestoWaveSense meter.  Dispense QS for testing up to thrice daily.  Dx E11.9 04/07/16   Janit Pagan T, MD  glucose blood test strip Use as instructed for PrestoWaveSense meter.  Dispense QS for testing up to thrice daily.  Dx E11.9 09/11/16   Shon Hale, MD  Lancets MISC Dispense quantity sufficient for thrice daily testing. 09/11/16   Shon Hale, MD  levothyroxine (SYNTHROID) 75 MCG tablet TAKE 1 TABLET BY MOUTH ONCE DAILY BEFORE BREAKFAST 03/24/22   Shelby Mattocks, DO  losartan (COZAAR) 25 MG tablet Take 1 tablet by mouth once daily 03/25/22   Shelby Mattocks, DO  metFORMIN (GLUCOPHAGE) 850 MG tablet TAKE 1 TABLET BY MOUTH WITH BREAKFAST AND WITH LUNCH AND WITH SUPPER 03/24/22   Shelby Mattocks, DO  RELION SHORT PEN NEEDLES 31G X 8 MM MISC USE AS DIRECTED ONCE DAILY 07/09/15   Myra Rude, MD  simvastatin (ZOCOR) 40 MG tablet TAKE 1 TABLET BY MOUTH EVERY DAY AT BEDTIME 03/24/22   Shelby Mattocks, DO    Current Facility-Administered Medications  Medication Dose Route Frequency Provider Last Rate Last Admin   0.9 %  sodium chloride infusion   Intravenous Continuous Fayette Pho, MD 100 mL/hr at 06/14/22 1042 New Bag at 06/14/22 1042   calamine lotion 1 Application  1 Application Topical PRN Fayette Pho, MD       ceFEPIme (MAXIPIME) 2 g in sodium chloride 0.9 % 100 mL IVPB  2 g Intravenous Q12H Stevphen Rochester, RPH 200 mL/hr at 06/14/22 1610 2 g at 06/14/22 9604   cetaphil lotion   Topical PRN Fayette Pho, MD       ferrous gluconate [facility]) tablet 324 mg  324 mg Oral Q breakfast Fayette Pho, MD        hydrOXYzine (ATARAX) tablet 25 mg  25 mg Oral TID PRN Fayette Pho, MD       levothyroxine (SYNTHROID) tablet 88 mcg  88 mcg Oral QAC breakfast Fayette Pho, MD       pantoprazole (PROTONIX) injection 40 mg  40 mg Intravenous Daily Esterwood, Amy S, PA-C       vancomycin (VANCOCIN) IVPB 1000 mg/200 mL premix  1,000 mg Intravenous Q24H Stevphen Rochester, RPH 200 mL/hr at 06/14/22 0759 1,000 mg at 06/14/22 0759    Allergies as of 06/13/2022   (No Known Allergies)    Family History  Problem Relation Age of Onset   Alcohol abuse Father    Hyperlipidemia Sister    Depression Brother     Social History   Socioeconomic History   Marital status: Single    Spouse name: Not on file   Number of children: Not on file   Years of education: Not on file   Highest education level: Not on file  Occupational History   Not on file  Tobacco Use   Smoking status: Former   Smokeless tobacco: Never  Tobacco comments:    Stopped in 1996  Vaping Use   Vaping Use: Never used  Substance and Sexual Activity   Alcohol use: No    Alcohol/week: 0.0 standard drinks of alcohol   Drug use: No   Sexual activity: Not on file  Other Topics Concern   Not on file  Social History Narrative   Lives with daughter currently and she relies on her daughter for most of her care and interpretation    Social Determinants of Health   Financial Resource Strain: Not on file  Food Insecurity: No Food Insecurity (06/13/2022)   Hunger Vital Sign    Worried About Running Out of Food in the Last Year: Never true    Ran Out of Food in the Last Year: Never true  Transportation Needs: No Transportation Needs (06/13/2022)   PRAPARE - Administrator, Civil Service (Medical): No    Lack of Transportation (Non-Medical): No  Physical Activity: Not on file  Stress: Not on file  Social Connections: Not on file  Intimate Partner Violence: Not At Risk (06/13/2022)   Humiliation, Afraid, Rape, and Kick  questionnaire    Fear of Current or Ex-Partner: No    Emotionally Abused: No    Physically Abused: No    Sexually Abused: No    Review of Systems: Pertinent positive and negative review of systems were noted in the above HPI section.  All other review of systems was otherwise negative.   Physical Exam: Vital signs in last 24 hours: Temp:  [98.3 F (36.8 C)-100.7 F (38.2 C)] 99.6 F (37.6 C) (05/05 0800) Pulse Rate:  [90-98] 92 (05/05 0800) Resp:  [16] 16 (05/04 1838) BP: (99-113)/(51-56) 113/51 (05/05 0800) SpO2:  [96 %-100 %] 96 % (05/05 0800) Last BM Date : 06/13/22 (per pt) General:   Alert,  Well-developed, thin, older Hispanic female ,cooperative in NAD Head:  Normocephalic and atraumatic. Eyes:  Sclera clear, no icterus.   Conjunctiva pale. Ears:  Normal auditory acuity. Nose:  No deformity, discharge,  or lesions. Mouth:  No deformity or lesions.   Neck:  Supple; no masses or thyromegaly. Lungs:  Clear throughout to auscultation.   No wheezes, crackles, or rhonchi.  Heart:  Regular rate and rhythm; no murmurs, clicks, rubs,  or gallops. Abdomen:  Soft,nontender, BS active,nonpalp mass or hsm.   Rectal: Scant brown stool heme-negative Msk:  Symmetrical without gross deformities. . Pulses:  Normal pulses noted. Extremities:  Without clubbing or edema. Neurologic:  Alert and  oriented x4;  grossly normal neurologically. Skin:  Intact without significant lesions or rashes.. Psych:  Alert and cooperative. Normal mood and affect.  Intake/Output from previous day: 05/04 0701 - 05/05 0700 In: 2001.8 [IV Piggyback:2001.8] Out: -  Intake/Output this shift: Total I/O In: 832.8 [I.V.:832.8] Out: -   Lab Results: Recent Labs    06/13/22 1441 06/14/22 0129  WBC 20.4* 21.5*  HGB 9.9* 8.9*  HCT 29.4* 25.9*  PLT 701* 723*   BMET Recent Labs    06/13/22 1441 06/14/22 0129  NA 131* 133*  K 4.4 4.1  CL 96* 101  CO2 23 22  GLUCOSE 232* 178*  BUN 14 10   CREATININE 0.87 0.67  CALCIUM 8.5* 7.9*   LFT Recent Labs    06/14/22 0129  PROT 6.0*  ALBUMIN 1.8*  AST 31  ALT 22  ALKPHOS 92  BILITOT 0.4   PT/INR Recent Labs    06/14/22 0129  LABPROT 15.7*  INR  1.3*     IMPRESSION:  #14 68 year old non-English-speaking Hispanic female admitted with 2-week history of progressive fatigue and weakness. Workup at this point significant for UTI, she has leukocytosis and fever, consistent with SIRS Culture pending and IV antibiotics have been started.  #2 anemia, normocytic-anemia panel mixed, more consistent with anemia of chronic disease, ferritin likely elevated due to acute infection. She is B12 deficient  #3 thrombocytosis #4 confusing history with language barrier, previous notes suggest she had complained of black stools, she denies this to me and is currently heme-negative on rectal exam Certainly not having any current GI blood loss.  #5 family history of colon cancer in patient's grandmother-patient has not had any prior screening She will need eventual colonoscopy, not necessarily inpatient as she is currently heme-negative.  Plan: regular diet today Management of B12 deficiency per primary service Consider endoscopic evaluation later this week, versus outpatient.   Amy Esterwood PA-C 06/14/2022, 10:45 AM

## 2022-06-15 DIAGNOSIS — R531 Weakness: Secondary | ICD-10-CM

## 2022-06-15 DIAGNOSIS — D649 Anemia, unspecified: Secondary | ICD-10-CM

## 2022-06-15 DIAGNOSIS — E039 Hypothyroidism, unspecified: Secondary | ICD-10-CM

## 2022-06-15 DIAGNOSIS — K922 Gastrointestinal hemorrhage, unspecified: Secondary | ICD-10-CM | POA: Diagnosis present

## 2022-06-15 DIAGNOSIS — E538 Deficiency of other specified B group vitamins: Secondary | ICD-10-CM

## 2022-06-15 DIAGNOSIS — D531 Other megaloblastic anemias, not elsewhere classified: Secondary | ICD-10-CM

## 2022-06-15 DIAGNOSIS — R634 Abnormal weight loss: Secondary | ICD-10-CM

## 2022-06-15 DIAGNOSIS — D75839 Thrombocytosis, unspecified: Secondary | ICD-10-CM | POA: Insufficient documentation

## 2022-06-15 DIAGNOSIS — N390 Urinary tract infection, site not specified: Secondary | ICD-10-CM

## 2022-06-15 LAB — CORTISOL: Cortisol, Plasma: 12.8 ug/dL

## 2022-06-15 LAB — GLUCOSE, CAPILLARY
Glucose-Capillary: 331 mg/dL — ABNORMAL HIGH (ref 70–99)
Glucose-Capillary: 189 mg/dL — ABNORMAL HIGH (ref 70–99)
Glucose-Capillary: 191 mg/dL — ABNORMAL HIGH (ref 70–99)
Glucose-Capillary: 222 mg/dL — ABNORMAL HIGH (ref 70–99)

## 2022-06-15 LAB — URINE CULTURE

## 2022-06-15 LAB — BASIC METABOLIC PANEL
Anion gap: 11 (ref 5–15)
BUN: 12 mg/dL (ref 8–23)
CO2: 21 mmol/L — ABNORMAL LOW (ref 22–32)
Calcium: 7.7 mg/dL — ABNORMAL LOW (ref 8.9–10.3)
Chloride: 102 mmol/L (ref 98–111)
Creatinine, Ser: 0.77 mg/dL (ref 0.44–1.00)
GFR, Estimated: >60 mL/min (ref >60)
Glucose, Bld: 218 mg/dL — ABNORMAL HIGH (ref 70–99)
Potassium: 3.8 mmol/L (ref 3.5–5.1)
Sodium: 134 mmol/L — ABNORMAL LOW (ref 135–145)

## 2022-06-15 LAB — CBC
HCT: 26.3 % — ABNORMAL LOW (ref 36.0–46.0)
Hemoglobin: 9.1 g/dL — ABNORMAL LOW (ref 12.0–15.0)
MCH: 32.4 pg (ref 26.0–34.0)
MCHC: 34.6 g/dL (ref 30.0–36.0)
MCV: 93.6 fL (ref 80.0–100.0)
Platelets: 721 10*3/uL — ABNORMAL HIGH (ref 150–400)
RBC: 2.81 MIL/uL — ABNORMAL LOW (ref 3.87–5.11)
RDW: 12.1 % (ref 11.5–15.5)
WBC: 24.1 10*3/uL — ABNORMAL HIGH (ref 4.0–10.5)
nRBC: 0 % (ref 0.0–0.2)

## 2022-06-15 LAB — RPR: RPR Ser Ql: NONREACTIVE

## 2022-06-15 LAB — SEDIMENTATION RATE: Sed Rate: 132 mm/hr — ABNORMAL HIGH (ref 0–22)

## 2022-06-15 LAB — HEMOGLOBIN A1C
Hgb A1c MFr Bld: 6.1 % — ABNORMAL HIGH (ref 4.8–5.6)
Mean Plasma Glucose: 128.37 mg/dL

## 2022-06-15 MED ORDER — CAMPHOR-MENTHOL 0.5-0.5 % EX LOTN
TOPICAL_LOTION | CUTANEOUS | Status: DC | PRN
  Filled 2022-06-15: qty 222

## 2022-06-15 MED ORDER — INSULIN ASPART 100 UNIT/ML IJ SOLN
0.0000 [IU] | Freq: Three times a day (TID) | INTRAMUSCULAR | Status: DC
  Administered 2022-06-15: 7 [IU] via SUBCUTANEOUS
  Administered 2022-06-15: 2 [IU] via SUBCUTANEOUS
  Administered 2022-06-16 (×2): 3 [IU] via SUBCUTANEOUS
  Administered 2022-06-16 – 2022-06-17 (×2): 2 [IU] via SUBCUTANEOUS
  Administered 2022-06-17 (×2): 1 [IU] via SUBCUTANEOUS
  Administered 2022-06-18 – 2022-06-19 (×3): 2 [IU] via SUBCUTANEOUS
  Administered 2022-06-19: 3 [IU] via SUBCUTANEOUS

## 2022-06-15 MED ORDER — VITAMIN B-12 1000 MCG PO TABS: 1000.0000 ug | ORAL_TABLET | Freq: Every day | ORAL | Status: DC

## 2022-06-15 MED ORDER — CYANOCOBALAMIN 1000 MCG/ML IJ SOLN
1000.0000 ug | INTRAMUSCULAR | Status: DC
  Administered 2022-06-16 – 2022-06-18 (×2): 1000 ug via INTRAMUSCULAR
  Filled 2022-06-15 (×2): qty 1

## 2022-06-15 MED ORDER — PREDNISONE 20 MG PO TABS
20.0000 mg | ORAL_TABLET | Freq: Every day | ORAL | Status: DC
  Administered 2022-06-15 – 2022-06-19 (×5): 20 mg via ORAL
  Filled 2022-06-15 (×5): qty 1

## 2022-06-15 MED ORDER — SODIUM CHLORIDE 0.9 % IV SOLN
2.0000 g | INTRAVENOUS | Status: AC
  Administered 2022-06-15 – 2022-06-16 (×2): 2 g via INTRAVENOUS
  Filled 2022-06-15 (×2): qty 20

## 2022-06-15 NOTE — Assessment & Plan Note (Addendum)
[phone]. Unclear if acute phase reactant. Heme will follow-up in approximately 1 week outpatient-pending appointment. - Follow-up heme outpatient

## 2022-06-15 NOTE — Assessment & Plan Note (Deleted)
With initial fever, patient was started on broad-spectrum antibiotics.  UA concerning for UTI, however culture shows multi species-question utility of reculture at this time.  Discontinued vancomycin today.  Stepdown from cefepime to ceftriaxone IV.  May explain leukocytosis. - S/p cefepime (5/5 - 5/6) - Ceftriaxone (5/6 - ) - AM CBC

## 2022-06-15 NOTE — Hospital Course (Addendum)
Brittany Pineda is a 67 y.o female with history of hypertension, T2DM, hyperlipidemia,and hypothyroidism who was admitted for weakness and unintentional weight loss.  Her hospital course is outlined below.  WeaknessWeight loss Extensive workup including: CT abdomen/pelvis, CBC with differential, blood smear, TSH, CK, B12, folate, RPR, HIV, CMP, iron studies-unremarkable aside from slight B12 deficiency, anemia (see below) elevated TSH from baseline, leukocytosis + thrombocytosis see below.  At this time low concern for cancer without evidence on CT and blood smear.  Started B12 shots (see anemia below). Increased Synthroid to 225 mcg, recommend repeat studies outpatient.  Additionally patient endorsing depression and sertraline 50 mg was started. Due to proximal nature of muscle weakness and elevated ESR, PMR was considered and 20 mg prednisone started. Patient 60.8 kg at DC (likely scale discrepancy), admission weight of 56 kg. Patient had positive improvement with addition of prednisone, recommend continuing at DC and PCP follow-up to discuss continued efficacy. Plan for [facility] PT v OPPT depending on affordability.   Anemia Previous baseline 13, 2 years ago.  10.7 on admission, patient initially endorsed melena. Hemoglobin trended down to 8.9 and stabilized.  Iron studies showed elevated ferritin and low TIBC, likely in setting of infection (see UTI below). Serum iron low, likely iron deficiency. There was concern for GI bleed and GI consulted. EGD and Colonoscopy performed, no active bleed found. Edema and inflammation in stomach, biopsy taken and results pending at time of D/C. May have component of B12 deficiency per labs. Heme/Onc thinks B12 may be playing a role in patient's overall weakness and recommend weekly B12 injections for 2 months, then monthly injections.   Leukocytosis thrombocytosis WBC of 20.4 on admission and trended up on day 2 despite antibiotics.  Primary suspect  infectious source, blood smear negative for malignant cells.  Thrombocytosis 944 at time of D/C. Primarily suspect acute phase reactant from infection vs inflammation. Spoke with Heme/Onc on 06/16/2022, who raised concern for possible malignancy and recommend OP f/u in 1 week for additional testing (JAK 2 and possible marrow biopsy).   Urinary tract infection With leukocytosis and low-grade fever of 100.7 evening of admission, there was concern for infection.  Broad-spectrum antibiotics (cefepime/vancomycin) ordered, blood cultures symptomatic.  UA with evidence of UTI, unfortunately culture showed multiple species-but was not recollected due to starting antibiotics.  Transition to ceftriaxone on 06/15/2022. Compeleted 3 day IV abx course. Asymptomatic and adequately treated at time of D/C.  Other chronic conditions were medically managed with home medications and formulary alternatives as necessary (hypertension, hypothyroid, hyperlipidemia)  Follow-up recommendations Recommend obtaining weight to trend weight loss. Held BP medications in setting of orthostatic vitals and soft pressures. Reevaluate. Simvastatin Dc'd in favor of Crestor for high intensity statin (2ndary prevention) Started Sertraline for depressed mood. Recommend scheduling f/u in 3 weeks to assess efficacy. Increased Synthroid to 88 mcg daily, recommend repeat TSH in 6-8 weeks. Will need weekly B12 injections for B12 deficiency for 2 months. Then monthly. Ensure patient has f/u with Dr. Mosetta Putt for Hematology to assess Leukocytosis (now complicated by prednisone use) and thrombocytosis. Follow-up GI biopsy.  Started on Prednisone for PMR. Reassess for efficacy and improvement of weakness- recommend reduction to 10 mg if planning to continue. If Hgb is dropping, consider DC steroid. Assess patient's blood glucose level, inquire about hypoglycemia. Started back on glipizide for improved sugar control while on prednisone. Changed metformin  to 1000 BID.

## 2022-06-15 NOTE — Plan of Care (Signed)
  Problem: Pain Managment: Goal: General experience of comfort will improve Outcome: Progressing   Problem: Safety: Goal: Ability to remain free from injury will improve Outcome: Progressing   Problem: Skin Integrity: Goal: Risk for impaired skin integrity will decrease Outcome: Progressing   

## 2022-06-15 NOTE — Progress Notes (Signed)
Physical Therapy Treatment Patient Details Name: Brittany Pineda MRN: [id number] DOB: November 19, 1955 Today's Date: 06/15/2022   History of Present Illness 67 y.o. female presenting to the ED 5/4 with report of 2 weeks of fatigue, unintentional weight loss, poor p.o. intake, black tarry stools. Pt admitted for further testing. PMH: HTN, DMII, hypothyroidism    PT Comments    Patient resting in bed with daughter and grandson at bedside. Video Interpreter utilized for session. Pt completed bed mobility with min guard and use of bed features, Sit<>stand with RW and min guard today, pt ambulated increased distance, ~110' with min assist initially progressing to min guard then back to min assist as pt fatigued. VSS throughout and pt reported fatigue but denied, pain, dizziness, or SOB. Pt returned to EOB and performed seated LE exercises then returned to supine. Will continue to progress as able.    Recommendations for follow up therapy are one component of a multi-disciplinary discharge planning process, led by the attending physician.  Recommendations may be updated based on patient status, additional functional criteria and insurance authorization.  Follow Up Recommendations  Can patient physically be transported by private vehicle: Yes    Assistance Recommended at Discharge Intermittent Supervision/Assistance  Patient can return home with the following A little help with bathing/dressing/bathroom;Assistance with cooking/housework;Assist for transportation;Help with stairs or ramp for entrance   Equipment Recommendations  Rolling walker (2 wheels)    Recommendations for Other Services       Precautions / Restrictions Precautions Precautions: Fall Restrictions Weight Bearing Restrictions: No     Mobility  Bed Mobility Overal bed mobility: Needs Assistance Bed Mobility: Supine to Sit     Supine to sit: Min guard     General bed mobility comments: increased time use of  bed features    Transfers Overall transfer level: Needs assistance Equipment used: Rolling walker (2 wheels) Transfers: Sit to/from Stand Sit to Stand: Min guard           General transfer comment: use of hands and increased time    Ambulation/Gait Ambulation/Gait assistance: Min guard, Min assist Gait Distance (Feet): 110 Feet Assistive device: Rolling walker (2 wheels) Gait Pattern/deviations: Step-through pattern, Decreased stride length, Trunk flexed Gait velocity: decr     General Gait Details: slow/cautious gait pattern. pt reliant on RW, denies pain in LE's throughout and reports just tired. No LOB throughout.   Stairs             Wheelchair Mobility    Modified Rankin (Stroke Patients Only)       Balance Overall balance assessment: Mild deficits observed, not formally tested                                          Cognition Arousal/Alertness: Awake/alert Behavior During Therapy: Flat affect Overall Cognitive Status: Within Functional Limits for tasks assessed                                 General Comments: pt appears to have depressed affect        Exercises Other Exercises Other Exercises: 5x sit<>stand no UE use from EOB Other Exercises: 10x LAQ bil LE Other Exercises: 10x heel/toe raises bil LE    General Comments        Pertinent Vitals/Pain Pain Assessment Pain Assessment: Faces Pain  Location: Bil LE during amb Pain Descriptors / Indicators: Discomfort, Grimacing, Guarding Pain Intervention(s): Limited activity within patient's tolerance, Monitored during session, Repositioned    Home Living                          Prior Function            PT Goals (current goals can now be found in the care plan section) Acute Rehab PT Goals Patient Stated Goal: decrease pain PT Goal Formulation: With patient Time For Goal Achievement: 06/28/22 Potential to Achieve Goals: Good Progress  towards PT goals: Progressing toward goals    Frequency    Min 3X/week      PT Plan Current plan remains appropriate    Co-evaluation              AM-PAC PT "6 Clicks" Mobility   Outcome Measure  Help needed turning from your back to your side while in a flat bed without using bedrails?: None Help needed moving from lying on your back to sitting on the side of a flat bed without using bedrails?: None Help needed moving to and from a bed to a chair (including a wheelchair)?: A Little Help needed standing up from a chair using your arms (e.g., wheelchair or bedside chair)?: A Little Help needed to walk in hospital room?: A Little Help needed climbing 3-5 steps with a railing? : A Lot 6 Click Score: 19    End of Session Equipment Utilized During Treatment: Gait belt Activity Tolerance: Patient limited by pain Patient left: in bed;with call bell/phone within reach;with bed alarm set Nurse Communication: Mobility status PT Visit Diagnosis: Muscle weakness (generalized) (M62.81);Difficulty in walking, not elsewhere classified (R26.2);Pain Pain - Right/Left:  (bil) Pain - part of body: Leg;Ankle and joints of foot     Time: 1227-1252 PT Time Calculation (min) (ACUTE ONLY): 25 min  Charges:  $Gait Training: 8-22 mins $Therapeutic Activity: 8-22 mins                     Wynn Maudlin, DPT Acute Rehabilitation Services Office [phone]  06/15/22 2:51 PM

## 2022-06-15 NOTE — Progress Notes (Signed)
Daily Progress Note Intern Pager: [phone]  Patient name: Brittany Pineda Medical record number: [id number] Date of birth: [birth year] Age: 67 y.o. Gender: female  Primary Care Provider: Shelby Mattocks, DO Consultants: GI Code Status: FULL  Pt Overview and Major Events to Date:  5/4: Admitted  Assessment and Plan:  Brittany Pineda is a 67 y.o. female p/f myriad of symptoms with weight loss, fatigue, tarry stools, poor PO.   Encounter completed using [employer] video interpreting services in Spanish.  Weakness Workup thus far unremarkable.  Could be in setting of anemia, acute infection (UTI), hypothyroidism.  Will consider autoimmune workup in setting of elevated ESR.  Despite leukocytosis and thrombocytosis, low suspicion of malignancy (smear without malignant cells, infection/blood loss explains cell counts).  Essential hypertension At home takes amlodipine 10 mg and losartan 25 mg. Holding due to soft pressures.  - mIVF  Hypothyroidism -Continue synthroid 88 mcg -Repeat TSH 6-8 weeks outpatient  Controlled type 2 diabetes mellitus without complication, without long-term current use of insulin (HCC) Well controlled on metformin at home, holding while inpatient. Glucose >200 x2, will start insulin. - CBGs - SSI - Restart home metformin at dispo  Hyperlipidemia Does not appear to be compliant with simvastatin- unlikely the cause of her muscle pain. PREVENT score using lipid panel from 05/2022 shows ~12% risk of CVD and ~6% risk of ASCVD. Recommend statin therapy -Consider starting Crestor over simvastatin at DC  Thrombocytosis Differential broad and includes: Acute phase reactant from infection, acute blood loss, B12 deficiency.  Lower suspicion of malignancy (smear without evidence of malignant cells).  UTI (urinary tract infection) With initial fever, patient was started on broad-spectrum antibiotics.  UA concerning for UTI, however culture  shows multi species-question utility of reculture at this time.  Discontinued vancomycin today.  Stepdown from cefepime to ceftriaxone IV.  May explain leukocytosis. - S/p cefepime (5/5 - 5/6) - Ceftriaxone (5/6 - ) - AM CBC  Anemia Normocytic. Hgb stable 8.9>9.1 (baseline 13, 2 years ago). With the acuity of drop and potential reactive thrombocytosis, still suspect bleed of unknown origin.GI recs EGD, inpatient v outpatient. Question her ability to tolerate prep at this time. Anemia may also be related to B12 deficiency (however minimally low). -GI consulted, appreciate recs -Continue IV protonix -Clear liquid diet     FEN/GI: Regular PPx: SCDs Dispo:SNF pending clinical improvement .  Subjective:  Plan to return with interpreter to discuss plan this afternoon.  Objective: Temp:  [97.6 F (36.4 C)-99.5 F (37.5 C)] 97.6 F (36.4 C) (05/06 1110) Pulse Rate:  [82-97] 97 (05/06 1110) Resp:  [12-18] 16 (05/06 1110) BP: (90-120)/(47-79) 120/79 (05/06 1110) SpO2:  [90 %-99 %] 99 % (05/06 1110) Physical Exam: General: NAD, sleeping in bed Cardiovascular: RRR, no murmurs Respiratory: CTAB, normal wob on RA Abdomen: Soft, not tender, not distended Extremities: No edema  Laboratory: Most recent CBC Lab Results  Component Value Date   WBC 24.1 (H) 06/15/2022   HGB 9.1 (L) 06/15/2022   HCT 26.3 (L) 06/15/2022   MCV 93.6 06/15/2022   PLT 721 (H) 06/15/2022   Most recent BMP    Latest Ref Rng & Units 06/15/2022    1:16 AM  BMP  Glucose 70 - 99 mg/dL 562   BUN 8 - 23 mg/dL 12   Creatinine 1.30 - 1.00 mg/dL 8.65   Sodium 784 - 696 mmol/L 134   Potassium 3.5 - 5.1 mmol/L 3.8   Chloride 98 - 111 mmol/L 102  CO2 22 - 32 mmol/L 21   Calcium 8.9 - 10.3 mg/dL 7.7    Brittany Kocher, DO 06/15/2022, 12:44 PM  PGY-1, [facility] FPTS Intern pager: [phone], text pages welcome Secure chat group [facility] Teaching Service

## 2022-06-15 NOTE — Assessment & Plan Note (Addendum)
Stable 8.9. Primarily iron deficiency and B12 deficiency. No acute bleed on EGD/Colonoscopy. Continue PPI for gastritis.  -F/u biopsies OP -Continue PO protonix -Continue iron and B12 supplement

## 2022-06-15 NOTE — Progress Notes (Signed)
Progress Note  Primary GI: Unassigned  LOS: 0 days   Chief Complaint:Anemia   Subjective   Patient denies abdominal pain, nausea, and vomiting. Would like to eat as she is very hungry. States many years ago she has seen red and black in her stool but has not seen any since. Had a normal bowel movement this morning. Patient states she is very weak, but would prefer to do procedures while inpatient.  No family was present at the time of my evaluation.  [employer] video interpreting services was utilized for this encounter Victorino Dike, [place], # [phone])    Objective   Vital signs in last 24 hours: Temp:  [98.4 F (36.9 C)-99.5 F (37.5 C)] 98.7 F (37.1 C) (05/06 0756) Pulse Rate:  [82-87] 86 (05/06 0756) Resp:  [12-17] 17 (05/06 0756) BP: (90-110)/(47-55) 106/55 (05/06 0756) SpO2:  [90 %-97 %] 96 % (05/06 0756) Last BM Date : 06/13/22 Last BM recorded by nurses in past 5 days No data recorded  General:   female in no acute distress  Heart:  Regular rate and rhythm; no murmurs Pulm: Clear anteriorly; no wheezing Abdomen: soft, nondistended, normal bowel sounds in all quadrants. Nontender without guarding. No organomegaly appreciated. Neurologic:  Alert and  oriented x4;  No focal deficits.  Psych:  Cooperative. Normal mood and affect.  Intake/Output from previous day: 05/05 0701 - 05/06 0700 In: 1631.7 [I.V.:1331.7; IV Piggyback:300] Out: -  Intake/Output this shift: Total I/O In: 200 [IV Piggyback:200] Out: -   Studies/Results: ECHOCARDIOGRAM COMPLETE  Result Date: 06/14/2022    ECHOCARDIOGRAM REPORT   Patient Name:   SHENIAH DEMSKE Date of Exam: 06/14/2022 Medical Rec #:  [id number]                      Height:       63.0 in Accession #:    [id number]                     Weight:       123.4 lb Date of Birth:  24-Oct-1955                      BSA:          1.575 m Patient Age:    66 years                       BP:           113/51 mmHg Patient Gender: F                               HR:           83 bpm. Exam Location:  Inpatient Procedure: 2D Echo, Color Doppler and Cardiac Doppler Indications:    murmur  History:        Patient has no prior history of Echocardiogram examinations.                 Risk Factors:Diabetes, Hypertension and Dyslipidemia.  Sonographer:    Delcie Roch RDCS Referring Phys: [id number] CARINA M BROWN IMPRESSIONS  1. Left ventricular ejection fraction, by estimation, is 60 to 65%. The left ventricle has normal function. The left ventricle has no regional wall motion abnormalities. Left ventricular diastolic parameters were normal.  2. Right ventricular systolic function is normal. The right ventricular size is normal.  3. The mitral valve is grossly normal. Trivial mitral valve regurgitation.  4. The aortic valve is tricuspid. Aortic valve regurgitation is not visualized. Aortic valve sclerosis is present, with no evidence of aortic valve stenosis.  5. The inferior vena cava is normal in size with greater than 50% respiratory variability, suggesting right atrial pressure of 3 mmHg. Comparison(s): No prior Echocardiogram. FINDINGS  Left Ventricle: Left ventricular ejection fraction, by estimation, is 60 to 65%. The left ventricle has normal function. The left ventricle has no regional wall motion abnormalities. The left ventricular internal cavity size was normal in size. There is  no left ventricular hypertrophy. Left ventricular diastolic parameters were normal. Right Ventricle: The right ventricular size is normal. No increase in right ventricular wall thickness. Right ventricular systolic function is normal. Left Atrium: Left atrial size was normal in size. Right Atrium: Right atrial size was normal in size. Pericardium: There is no evidence of pericardial effusion. Mitral Valve: The mitral valve is grossly normal. Trivial mitral valve regurgitation. Tricuspid Valve: The tricuspid valve is normal in structure. Tricuspid valve regurgitation is  trivial. Aortic Valve: The aortic valve is tricuspid. Aortic valve regurgitation is not visualized. Aortic valve sclerosis is present, with no evidence of aortic valve stenosis. Pulmonic Valve: The pulmonic valve was not well visualized. Pulmonic valve regurgitation is trivial. Aorta: The aortic root is normal in size and structure. Venous: The inferior vena cava is normal in size with greater than 50% respiratory variability, suggesting right atrial pressure of 3 mmHg. IAS/Shunts: The atrial septum is grossly normal.  LEFT VENTRICLE PLAX 2D LVIDd:         3.70 cm   Diastology LVIDs:         2.50 cm   LV e' medial:    6.53 cm/s LV PW:         1.00 cm   LV E/e' medial:  12.8 LV IVS:        0.80 cm   LV e' lateral:   11.40 cm/s LVOT diam:     1.90 cm   LV E/e' lateral: 7.4 LV SV:         68 LV SV Index:   43 LVOT Area:     2.84 cm  RIGHT VENTRICLE             IVC RV S prime:     19.00 cm/s  IVC diam: 2.20 cm TAPSE (M-mode): 1.8 cm LEFT ATRIUM             Index        RIGHT ATRIUM           Index LA diam:        3.30 cm 2.10 cm/m   RA Area:     12.40 cm LA Vol (A2C):   53.1 ml 33.72 ml/m  RA Volume:   26.50 ml  16.83 ml/m LA Vol (A4C):   42.1 ml 26.73 ml/m LA Biplane Vol: 49.8 ml 31.62 ml/m  AORTIC VALVE LVOT Vmax:   131.00 cm/s LVOT Vmean:  83.700 cm/s LVOT VTI:    0.240 m  AORTA Ao Root diam: 2.90 cm Ao Asc diam:  3.10 cm MITRAL VALVE MV Area (PHT): 3.17 cm    SHUNTS MV Decel Time: 239 msec    Systemic VTI:  0.24 m MV E velocity: 83.90 cm/s  Systemic Diam: 1.90 cm MV A velocity: 77.00 cm/s MV E/A ratio:  1.09 Laurance Flatten MD Electronically signed by Laurance Flatten MD Signature  Date/Time: 06/14/2022/3:40:33 PM    Final    CT ABDOMEN PELVIS W CONTRAST  Result Date: 06/13/2022 CLINICAL DATA:  Unintended weight loss. Elevated white count. Weakness. EXAM: CT ABDOMEN AND PELVIS WITH CONTRAST TECHNIQUE: Multidetector CT imaging of the abdomen and pelvis was performed using the standard protocol following  bolus administration of intravenous contrast. RADIATION DOSE REDUCTION: This exam was performed according to the departmental dose-optimization program which includes automated exposure control, adjustment of the mA and/or kV according to patient size and/or use of iterative reconstruction technique. CONTRAST:  75mL OMNIPAQUE IOHEXOL 350 MG/ML SOLN COMPARISON:  Chest radiography same day FINDINGS: Lower chest: Presumed linear scars in both lower lobes. Linear atelectasis less likely. No pleural effusion. Hepatobiliary: Liver parenchyma is normal. Previous cholecystectomy. Pancreas: Normal Spleen: Normal Adrenals/Urinary Tract: Adrenal glands are normal. Kidneys are normal. No mass, stone or hydronephrosis. Retroperitoneal phleboliths on the right are adjacent to the ureter. No ureteral stone. No stone in the bladder. Stomach/Bowel: Stomach and small intestine are normal. Normal appearance of the colon. No evidence of obstruction or bowel inflammation. No sign of mass lesion. Vascular/Lymphatic: Aortic atherosclerosis. No aneurysm. IVC is normal. No adenopathy. Reproductive: Previous hysterectomy.  No pelvic mass. Other: No free fluid or air.  No hernia. Musculoskeletal: Ordinary spinal degenerative changes are noted. IMPRESSION: 1. No abnormality seen to explain the clinical presentation. 2. Previous cholecystectomy and hysterectomy. 3. Aortic atherosclerosis. 4. Presumed linear scars in both lower lobes. Linear atelectasis less likely. Aortic Atherosclerosis (ICD10-I70.0). Electronically Signed   By: Paulina Fusi M.D.   On: 06/13/2022 18:12   DG Chest 2 View  Result Date: 06/13/2022 CLINICAL DATA:  prolonged weakness EXAM: CHEST - 2 VIEW COMPARISON:  August 29, 2018 FINDINGS: The cardiomediastinal silhouette is unchanged in contour. No pleural effusion. No pneumothorax. No acute pleuroparenchymal abnormality. Visualized abdomen is unremarkable. Multilevel degenerative changes of the thoracic spine. IMPRESSION: No  acute cardiopulmonary abnormality. Electronically Signed   By: Meda Klinefelter M.D.   On: 06/13/2022 17:22    Lab Results: Recent Labs    06/14/22 0129 06/14/22 1229 06/15/22 0116  WBC 21.5* 20.5* 24.1*  HGB 8.9* 8.9* 9.1*  HCT 25.9* 26.2* 26.3*  PLT 723* 680* 721*   BMET Recent Labs    06/13/22 1441 06/14/22 0129 06/15/22 0116  NA 131* 133* 134*  K 4.4 4.1 3.8  CL 96* 101 102  CO2 23 22 21*  GLUCOSE 232* 178* 218*  BUN 14 10 12   CREATININE 0.87 0.67 0.77  CALCIUM 8.5* 7.9* 7.7*   LFT Recent Labs    06/14/22 0129  PROT 6.0*  ALBUMIN 1.8*  AST 31  ALT 22  ALKPHOS 92  BILITOT 0.4   PT/INR Recent Labs    06/14/22 0129  LABPROT 15.7*  INR 1.3*     Scheduled Meds:  ferrous gluconate  324 mg Oral Q breakfast   levothyroxine  88 mcg Oral QAC breakfast   pantoprazole (PROTONIX) IV  40 mg Intravenous Daily   Continuous Infusions:  sodium chloride 50 mL/hr at 06/14/22 1623   ceFEPime (MAXIPIME) IV 2 g (06/15/22 0523)   vancomycin 1,000 mg (06/15/22 4098)      Impression:   Anemia; normocytic-anemia panel mixed - heme negative stool - hgb 9.1 - TIBC 21, plt 723 - TSH 7.7 - B12 166, normal folate - Ferritin 430, iron 9, TIBC 105, sat 9% - BUN 12, Cr. 0.77 - CT ab/pelvis with contrast: normal appearance of colon and stomach, previous cholecystectomy and hysterectomy.  Patient prefers procedures inpatient but reports she is still very weak. Vitals stable, increase in leukocytosis since yesterday. Possibly consider EGD and defer colonoscopy to when patient has more strength to tolerate prep. Hgb stable and heme negative stool/no overt bleeding is reassuring.  UTI - WBC 24.1 (20.5) - on cefepime - no fever overnight    Plan:   - plan for EGD/colonoscopy when patient is able to tolerate procedures. Could consider doing EGD first as prep may be difficult for her - Continue daily CBC and transfuse as needed to maintain HGB > 7 - Protonix 40mg  IV  BID - continue supportive care - continue clears for now, if no plans for colonoscopy can have soft later  [facility]  06/15/2022, 9:11 AM

## 2022-06-16 DIAGNOSIS — E538 Deficiency of other specified B group vitamins: Secondary | ICD-10-CM

## 2022-06-16 DIAGNOSIS — E119 Type 2 diabetes mellitus without complications: Secondary | ICD-10-CM

## 2022-06-16 DIAGNOSIS — R630 Anorexia: Secondary | ICD-10-CM

## 2022-06-16 DIAGNOSIS — R4589 Other symptoms and signs involving emotional state: Secondary | ICD-10-CM | POA: Insufficient documentation

## 2022-06-16 DIAGNOSIS — K921 Melena: Secondary | ICD-10-CM

## 2022-06-16 DIAGNOSIS — I10 Essential (primary) hypertension: Secondary | ICD-10-CM

## 2022-06-16 DIAGNOSIS — D75839 Thrombocytosis, unspecified: Secondary | ICD-10-CM

## 2022-06-16 LAB — CBC WITH DIFFERENTIAL/PLATELET
Abs Immature Granulocytes: 0.52 10*3/uL — ABNORMAL HIGH (ref 0.00–0.07)
Basophils Absolute: 0.1 10*3/uL (ref 0.0–0.1)
Basophils Relative: 0 %
Eosinophils Absolute: 0 10*3/uL (ref 0.0–0.5)
Eosinophils Relative: 0 %
HCT: 28.2 % — ABNORMAL LOW (ref 36.0–46.0)
Hemoglobin: 9.4 g/dL — ABNORMAL LOW (ref 12.0–15.0)
Immature Granulocytes: 2 %
Lymphocytes Relative: 3 %
Lymphs Abs: 0.8 10*3/uL (ref 0.7–4.0)
MCH: 31.3 pg (ref 26.0–34.0)
MCHC: 33.3 g/dL (ref 30.0–36.0)
MCV: 94 fL (ref 80.0–100.0)
Monocytes Absolute: 0.6 10*3/uL (ref 0.1–1.0)
Monocytes Relative: 3 %
Neutro Abs: 20.7 10*3/uL — ABNORMAL HIGH (ref 1.7–7.7)
Neutrophils Relative %: 92 %
Platelets: 655 10*3/uL — ABNORMAL HIGH (ref 150–400)
RBC: 3 MIL/uL — ABNORMAL LOW (ref 3.87–5.11)
RDW: 12.1 % (ref 11.5–15.5)
WBC: 22.7 10*3/uL — ABNORMAL HIGH (ref 4.0–10.5)
nRBC: 0 % (ref 0.0–0.2)

## 2022-06-16 LAB — BASIC METABOLIC PANEL
Anion gap: 10 (ref 5–15)
BUN: 9 mg/dL (ref 8–23)
CO2: 19 mmol/L — ABNORMAL LOW (ref 22–32)
Calcium: 7.5 mg/dL — ABNORMAL LOW (ref 8.9–10.3)
Chloride: 103 mmol/L (ref 98–111)
Creatinine, Ser: 0.7 mg/dL (ref 0.44–1.00)
GFR, Estimated: >60 mL/min (ref >60)
Glucose, Bld: 274 mg/dL — ABNORMAL HIGH (ref 70–99)
Potassium: 4 mmol/L (ref 3.5–5.1)
Sodium: 132 mmol/L — ABNORMAL LOW (ref 135–145)

## 2022-06-16 LAB — GLUCOSE, CAPILLARY
Glucose-Capillary: 240 mg/dL — ABNORMAL HIGH (ref 70–99)
Glucose-Capillary: 189 mg/dL — ABNORMAL HIGH (ref 70–99)
Glucose-Capillary: 236 mg/dL — ABNORMAL HIGH (ref 70–99)
Glucose-Capillary: 229 mg/dL — ABNORMAL HIGH (ref 70–99)

## 2022-06-16 LAB — STRONGYLOIDES, AB, IGG: Strongyloides, Ab, IgG: NEGATIVE

## 2022-06-16 LAB — RHEUMATOID FACTOR: Rheumatoid fact SerPl-aCnc: 15.7 IU/mL — ABNORMAL HIGH (ref <14.0)

## 2022-06-16 LAB — ANA: Anti Nuclear Antibody (ANA): NEGATIVE

## 2022-06-16 MED ORDER — SODIUM CHLORIDE 0.9 % IV SOLN: INTRAVENOUS | Status: DC

## 2022-06-16 MED ORDER — PEG-KCL-NACL-NASULF-NA ASC-C 100 G PO SOLR
0.5000 | Freq: Once | ORAL | Status: AC
  Administered 2022-06-17: 100 g via ORAL

## 2022-06-16 MED ORDER — INSULIN GLARGINE-YFGN 100 UNIT/ML ~~LOC~~ SOLN
6.0000 [IU] | Freq: Every day | SUBCUTANEOUS | Status: DC
  Administered 2022-06-16 – 2022-06-19 (×4): 6 [IU] via SUBCUTANEOUS
  Filled 2022-06-16 (×4): qty 0.06

## 2022-06-16 MED ORDER — PEG-KCL-NACL-NASULF-NA ASC-C 100 G PO SOLR
0.5000 | Freq: Once | ORAL | Status: AC
  Administered 2022-06-16: 100 g via ORAL
  Filled 2022-06-16: qty 1

## 2022-06-16 MED ORDER — SERTRALINE HCL 50 MG PO TABS
50.0000 mg | ORAL_TABLET | Freq: Every day | ORAL | Status: DC
  Administered 2022-06-16 – 2022-06-19 (×4): 50 mg via ORAL
  Filled 2022-06-16 (×4): qty 1

## 2022-06-16 MED ORDER — PEG-KCL-NACL-NASULF-NA ASC-C 100 G PO SOLR: 1.0000 | Freq: Once | ORAL | Status: DC

## 2022-06-16 NOTE — H&P (View-Only) (Signed)
  Progress Note  Primary GI: Unassigned  LOS: 1 day   Chief Complaint:anemia   Subjective   No family was present at the time of my evaluation.  [employer] video interpreting services was utilized for this encounter ([place], [id number], # spanish)   Patient denies nausea, vomiting, abdominal pain, melena, or hematochezia. Sitting up in chair and tolerating clear liquids. States she is ready to proceed with egd/colonoscopy   Objective   Vital signs in last 24 hours: Temp:  [97.5 F (36.4 C)-99.8 F (37.7 C)] 97.8 F (36.6 C) (05/07 0739) Pulse Rate:  [68-86] 68 (05/07 0441) Resp:  [11-20] 20 (05/07 0739) BP: (92-106)/(50-57) 92/52 (05/07 0739) SpO2:  [94 %-98 %] 98 % (05/07 0441) Last BM Date : 06/15/22 Last BM recorded by nurses in past 5 days Stool Type: Type 6 (Mushy consistency with ragged edges) (06/15/2022 10:25 AM)  General:   female in no acute distress  Heart:  Regular rate and rhythm; no murmurs Pulm: Clear anteriorly; no wheezing Abdomen: soft, nondistended, normal bowel sounds in all quadrants. Nontender without guarding. No organomegaly appreciated. Extremities:  No edema Neurologic:  Alert and  oriented x4;  No focal deficits.  Psych:  Cooperative. Normal mood and affect.  Intake/Output from previous day: 05/06 0701 - 05/07 0700 In: 3380.6 [P.O.:970; I.V.:1910.6; IV Piggyback:500] Out: -  Intake/Output this shift: Total I/O In: 120 [P.O.:120] Out: -   Studies/Results: ECHOCARDIOGRAM COMPLETE  Result Date: 06/14/2022    ECHOCARDIOGRAM REPORT   Patient Name:   Brittany Pineda Date of Exam: 06/14/2022 Medical Rec #:  [id number]                      Height:       63.0 in Accession #:    [id number]                     Weight:       123.4 lb Date of Birth:  12/08/1955                      BSA:          1.575 m Patient Age:    66 years                       BP:           113/51 mmHg Patient Gender: F                              HR:           83 bpm. Exam  Location:  Inpatient Procedure: 2D Echo, Color Doppler and Cardiac Doppler Indications:    murmur  History:        Patient has no prior history of Echocardiogram examinations.                 Risk Factors:Diabetes, Hypertension and Dyslipidemia.  Sonographer:    Lauren Pennington RDCS Referring Phys: [id number] CARINA M BROWN IMPRESSIONS  1. Left ventricular ejection fraction, by estimation, is 60 to 65%. The left ventricle has normal function. The left ventricle has no regional wall motion abnormalities. Left ventricular diastolic parameters were normal.  2. Right ventricular systolic function is normal. The right ventricular size is normal.  3. The mitral valve is grossly normal. Trivial mitral valve regurgitation.  4. The aortic valve is tricuspid.   Aortic valve regurgitation is not visualized. Aortic valve sclerosis is present, with no evidence of aortic valve stenosis.  5. The inferior vena cava is normal in size with greater than 50% respiratory variability, suggesting right atrial pressure of 3 mmHg. Comparison(s): No prior Echocardiogram. FINDINGS  Left Ventricle: Left ventricular ejection fraction, by estimation, is 60 to 65%. The left ventricle has normal function. The left ventricle has no regional wall motion abnormalities. The left ventricular internal cavity size was normal in size. There is  no left ventricular hypertrophy. Left ventricular diastolic parameters were normal. Right Ventricle: The right ventricular size is normal. No increase in right ventricular wall thickness. Right ventricular systolic function is normal. Left Atrium: Left atrial size was normal in size. Right Atrium: Right atrial size was normal in size. Pericardium: There is no evidence of pericardial effusion. Mitral Valve: The mitral valve is grossly normal. Trivial mitral valve regurgitation. Tricuspid Valve: The tricuspid valve is normal in structure. Tricuspid valve regurgitation is trivial. Aortic Valve: The aortic valve is  tricuspid. Aortic valve regurgitation is not visualized. Aortic valve sclerosis is present, with no evidence of aortic valve stenosis. Pulmonic Valve: The pulmonic valve was not well visualized. Pulmonic valve regurgitation is trivial. Aorta: The aortic root is normal in size and structure. Venous: The inferior vena cava is normal in size with greater than 50% respiratory variability, suggesting right atrial pressure of 3 mmHg. IAS/Shunts: The atrial septum is grossly normal.  LEFT VENTRICLE PLAX 2D LVIDd:         3.70 cm   Diastology LVIDs:         2.50 cm   LV e' medial:    6.53 cm/s LV PW:         1.00 cm   LV E/e' medial:  12.8 LV IVS:        0.80 cm   LV e' lateral:   11.40 cm/s LVOT diam:     1.90 cm   LV E/e' lateral: 7.4 LV SV:         68 LV SV Index:   43 LVOT Area:     2.84 cm  RIGHT VENTRICLE             IVC RV S prime:     19.00 cm/s  IVC diam: 2.20 cm TAPSE (M-mode): 1.8 cm LEFT ATRIUM             Index        RIGHT ATRIUM           Index LA diam:        3.30 cm 2.10 cm/m   RA Area:     12.40 cm LA Vol (A2C):   53.1 ml 33.72 ml/m  RA Volume:   26.50 ml  16.83 ml/m LA Vol (A4C):   42.1 ml 26.73 ml/m LA Biplane Vol: 49.8 ml 31.62 ml/m  AORTIC VALVE LVOT Vmax:   131.00 cm/s LVOT Vmean:  83.700 cm/s LVOT VTI:    0.240 m  AORTA Ao Root diam: 2.90 cm Ao Asc diam:  3.10 cm MITRAL VALVE MV Area (PHT): 3.17 cm    SHUNTS MV Decel Time: 239 msec    Systemic VTI:  0.24 m MV E velocity: 83.90 cm/s  Systemic Diam: 1.90 cm MV A velocity: 77.00 cm/s MV E/A ratio:  1.09 Heather Pemberton MD Electronically signed by Heather Pemberton MD Signature Date/Time: 06/14/2022/3:40:33 PM    Final     Lab Results: Recent Labs      06/14/22 1229 06/15/22 0116 06/16/22 0126  WBC 20.5* 24.1* 22.7*  HGB 8.9* 9.1* 9.4*  HCT 26.2* 26.3* 28.2*  PLT 680* 721* 655*   BMET Recent Labs    06/14/22 0129 06/15/22 0116 06/16/22 0126  NA 133* 134* 132*  K 4.1 3.8 4.0  CL 101 102 103  CO2 22 21* 19*  GLUCOSE 178* 218*  274*  BUN 10 12 9  CREATININE 0.67 0.77 0.70  CALCIUM 7.9* 7.7* 7.5*   LFT Recent Labs    06/14/22 0129  PROT 6.0*  ALBUMIN 1.8*  AST 31  ALT 22  ALKPHOS 92  BILITOT 0.4   PT/INR Recent Labs    06/14/22 0129  LABPROT 15.7*  INR 1.3*     Scheduled Meds:  cyanocobalamin  1,000 mcg Intramuscular Once per day on Tue Thu Sat   ferrous gluconate  324 mg Oral Q breakfast   insulin aspart  0-9 Units Subcutaneous TID WC   insulin glargine-yfgn  6 Units Subcutaneous Daily   levothyroxine  88 mcg Oral QAC breakfast   pantoprazole (PROTONIX) IV  40 mg Intravenous Daily   predniSONE  20 mg Oral Q breakfast   sertraline  50 mg Oral Daily   Continuous Infusions:  sodium chloride 125 mL/hr at 06/16/22 0453   cefTRIAXone (ROCEPHIN)  IV 2 g (06/15/22 1355)       Impression:   Anemia; normocytic-anemia panel mixed - heme negative stool - hgb 9.4 - TIBC 21, plt 723 - TSH 7.7 - B12 166, normal folate - Ferritin 430, iron 9, TIBC 105, sat 9% - BUN 9, Cr. 0.7 - CT ab/pelvis with contrast: normal appearance of colon and stomach, previous cholecystectomy and hysterectomy.   UTI - WBC 22.7, improved - on cefepime - no fever overnight    Plan:   Plan for EGD/Colonoscopy tomorrow. I thoroughly discussed the procedures to include nature, alternatives, benefits, and risks including but not limited to bleeding, perforation, infection, anesthesia/cardiac and pulmonary complications. Patient provides understanding and gave verbal consent to proceed. Continue Protonix 40 mg IV BID. Moviprep, clear liquid diet, NPO at midnight (except prep). Continue daily CBC with transfusion as needed to maintain Hgb >7.     Lavern Maslow M Terez Freimark  06/16/2022, 11:23 AM    

## 2022-06-16 NOTE — Progress Notes (Signed)
Daily Progress Note Intern Pager: [phone]  Patient name: Brittany Pineda Medical record number: [id number] Date of birth: [birth year] Age: 67 y.o. Gender: female  Primary Care Provider: Shelby Mattocks, DO Consultants: GI Code Status: FULL  Pt Overview and Major Events to Date:  5/4: Admitted  Assessment and Plan: Brittany Pineda is a 67 y.o. female p/f myriad of symptoms with weight loss, fatigue, tarry stools, poor PO.    Encounter completed using [employer] video interpreting services in Spanish. Interpreter Donald Pore [phone], Meta Hatchet [phone].  Spoke with Dr. Mosetta Putt with Heme/Onc. She agrees this patient's clinical case is concerning for cancer, and also may be related to B12 deficiency. Recommend aggressive replacement of B12 (weekly injection for 2 months, then 1nce monthly) and OP f/u in 1 week for JAK2 testing and consideration of marrrow biopsy. She agrees with necessity of EGD/Colonoscopy in setting of anemia and previous endorsement of melena. Less likely infectious without clear source and minimal response to abx. Dr. Mosetta Putt will schedule follow-up for patient. We greatly appreciate Dr. Latanya Maudlin assistance in this patient's care.  Weakness Likely multifactorial: Anemia (blood loss v B12 deficiency), Hypothyroidism, and acute illness. With Leukocytosis and thrombocytosis (not responded to abx), and weight loss there is still concern for malignancy. Will follow-up with Heme/Onc (see above). She did respond to prednisone, which was started for concern of PMR- recommend continuing and can re-evaluate with PCP. -F/u EGD/Colonoscopy -OP Heme   Essential hypertension Pressures remain soft.At home takes amlodipine 10 mg and losartan 25 mg. - Continue to hold home meds - mIVF  Hypothyroidism -Continue synthroid 88 mcg -Repeat TSH 6-8 weeks outpatient  Controlled type 2 diabetes mellitus without complication, without long-term current use of insulin (HCC) Glucose  remains above >200, received 12 U fast acting last 24 hr. Will start long acting insulin. - CBGs - Semglee 6U daily - SSI - Restart home metformin at dispo  Hyperlipidemia Needs statin for 2ndary prevention. -Consider starting Crestor over simvastatin at DC  Depressed mood Endorses sadness, LOI, poor sleep. Worsening over several years. Would like to see therapy and is interested in medical treatment. -TOC consult for resources -Sertraline 50 mg daily  B12 deficiency Continue every other day B12 injection while admitted (Tue,TH,Sat). Switch to once weekly B12 injection for 2 months, then once monthly at D/C  Thrombocytosis 721>655. Unclear if acute phase reactant from blood loss v. Infection. Plan to see Heme OP as above.  UTI (urinary tract infection) Last dose CTX today to complete 3 day course of IV abx.  Anemia Stable 9.1>9.4. Pending EGD/colonoscopy with GI -GI consulted, appreciate recs -Continue IV protonix -Clear liquid diet   FEN/GI: Clear liquid (anticipation of colonoscopy), NPO @ MN except prep. PPx: SCDs Dispo:Home pending clinical improvement .   Subjective:  Endorses depressed mood. Feels slightly better this AM.  Objective: Temp:  [97.5 F (36.4 C)-99.8 F (37.7 C)] 97.6 F (36.4 C) (05/07 1130) Pulse Rate:  [66-86] 66 (05/07 1130) Resp:  [11-20] 13 (05/07 1130) BP: (89-106)/(50-57) 89/50 (05/07 1130) SpO2:  [94 %-98 %] 97 % (05/07 1130) Physical Exam: General: NAD, resting in bed Cardiovascular: RRR, no murmurs Respiratory: CTAB, normal wob on RA Abdomen: Soft, not tender, not distended Extremities: No edema MSK: 5/5 STR UE/LE bilaterally  Laboratory: Most recent CBC Lab Results  Component Value Date   WBC 22.7 (H) 06/16/2022   HGB 9.4 (L) 06/16/2022   HCT 28.2 (L) 06/16/2022   MCV 94.0 06/16/2022   PLT 655 (  H) 06/16/2022   Most recent BMP    Latest Ref Rng & Units 06/16/2022    1:26 AM  BMP  Glucose 70 - 99 mg/dL 161   BUN 8 - 23  mg/dL 9   Creatinine 0.96 - 0.45 mg/dL 4.09   Sodium 811 - 914 mmol/L 132   Potassium 3.5 - 5.1 mmol/L 4.0   Chloride 98 - 111 mmol/L 103   CO2 22 - 32 mmol/L 19   Calcium 8.9 - 10.3 mg/dL 7.5    Tiffany Kocher, DO 06/16/2022, 11:49 AM  PGY-1, [facility] FPTS Intern pager: [phone], text pages welcome Secure chat group [facility] Teaching Service

## 2022-06-16 NOTE — Progress Notes (Signed)
Occupational Therapy Treatment Patient Details Name: Brittany Pineda MRN: [id number] DOB: 1955/10/01 Today's Date: 06/16/2022   History of present illness 67 y.o. female presenting to the ED 5/4 with report of 2 weeks of fatigue, unintentional weight loss, poor p.o. intake, black tarry stools. Pt admitted for further testing. PMH: HTN, DMII, hypothyroidism   OT comments  Pt progressing towards goals this session, needing supervision- min guard for ADLs, supervision for bed mobility, and min guard A for transfers with RW. Pt reports BLE pain has improved since time of evaluation, however pt still with concerns regarding caring for herself at home due to lack of support. Pt presenting with impairments listed below, will follow acutely. Patient will benefit from continued inpatient follow up therapy, <3 hours/day to maximize safety and independence with ADLs/functional mobility.    Recommendations for follow up therapy are one component of a multi-disciplinary discharge planning process, led by the attending physician.  Recommendations may be updated based on patient status, additional functional criteria and insurance authorization.    Assistance Recommended at Discharge Frequent or constant Supervision/Assistance  Patient can return home with the following  A little help with walking and/or transfers;A lot of help with bathing/dressing/bathroom;Direct supervision/assist for medications management;Direct supervision/assist for financial management;Assist for transportation;Help with stairs or ramp for entrance   Equipment Recommendations  BSC/3in1;Tub/shower seat    Recommendations for Other Services PT consult    Precautions / Restrictions Precautions Precautions: Fall Restrictions Weight Bearing Restrictions: No       Mobility Bed Mobility Overal bed mobility: Needs Assistance Bed Mobility: Supine to Sit     Supine to sit: Supervision           Transfers Overall transfer level: Needs assistance Equipment used: Rolling walker (2 wheels) Transfers: Sit to/from Stand Sit to Stand: Min guard                 Balance Overall balance assessment: Mild deficits observed, not formally tested                                         ADL either performed or assessed with clinical judgement   ADL Overall ADL's : Needs assistance/impaired     Grooming: Oral care;Wash/dry face;Wash/dry hands;Standing;Supervision/safety Grooming Details (indicate cue type and reason): standing at sink for 5 min                 Toilet Transfer: Min guard;Ambulation;Regular Toilet;Rolling walker (2 wheels)   Toileting- Clothing Manipulation and Hygiene: Supervision/safety;Sit to/from stand Toileting - Clothing Manipulation Details (indicate cue type and reason): pericare     Functional mobility during ADLs: Min guard;Rolling walker (2 wheels)      Extremity/Trunk Assessment Upper Extremity Assessment Upper Extremity Assessment: Generalized weakness   Lower Extremity Assessment Lower Extremity Assessment: Defer to PT evaluation        Vision   Vision Assessment?: No apparent visual deficits   Perception Perception Perception: Not tested   Praxis Praxis Praxis: Not tested    Cognition Arousal/Alertness: Awake/alert Behavior During Therapy: Flat affect Overall Cognitive Status: Difficult to assess                                 General Comments: pt appears to have depressed affect        Exercises  Shoulder Instructions       General Comments      Pertinent Vitals/ Pain       Pain Assessment Pain Assessment: No/denies pain  Home Living                                          Prior Functioning/Environment              Frequency  Min 1X/week        Progress Toward Goals  OT Goals(current goals can now be found in the care plan section)   Progress towards OT goals: Progressing toward goals  Acute Rehab OT Goals Patient Stated Goal: none stated OT Goal Formulation: With patient Time For Goal Achievement: 06/28/22 Potential to Achieve Goals: Good ADL Goals Pt Will Perform Upper Body Dressing: with supervision;sitting Pt Will Perform Lower Body Dressing: with supervision;sitting/lateral leans;sit to/from stand Pt Will Transfer to Toilet: with supervision;ambulating;regular height toilet Pt Will Perform Tub/Shower Transfer: Tub transfer;Shower transfer;with supervision;ambulating Additional ADL Goal #1: pt will verbalize 3 energy conservation strategies in prep for ADLs  Plan Discharge plan remains appropriate;Frequency remains appropriate    Co-evaluation                 AM-PAC OT "6 Clicks" Daily Activity     Outcome Measure   Help from another person eating meals?: None Help from another person taking care of personal grooming?: A Little Help from another person toileting, which includes using toliet, bedpan, or urinal?: A Little Help from another person bathing (including washing, rinsing, drying)?: A Lot Help from another person to put on and taking off regular upper body clothing?: A Little Help from another person to put on and taking off regular lower body clothing?: A Little 6 Click Score: 18    End of Session Equipment Utilized During Treatment: Rolling walker (2 wheels)  OT Visit Diagnosis: Unsteadiness on feet (R26.81);Other abnormalities of gait and mobility (R26.89);Muscle weakness (generalized) (M62.81)   Activity Tolerance Patient tolerated treatment well   Patient Left in chair;with call bell/phone within reach;with chair alarm set   Nurse Communication Mobility status        Time: 1610-9604 OT Time Calculation (min): 19 min  Charges: OT General Charges $OT Visit: 1 Visit OT Treatments $Self Care/Home Management : 8-22 mins  Carver Fila, OTD, OTR/L SecureChat Preferred Acute  Rehab [phone]   Carver Fila Koonce 06/16/2022, 9:16 AM

## 2022-06-16 NOTE — Progress Notes (Signed)
Progress Note  Primary GI: Unassigned  LOS: 1 day   Chief Complaint:anemia   Subjective   No family was present at the time of my evaluation.  [employer] video interpreting services was utilized for this encounter Brittany Pineda, [phone], # spanish)   Patient denies nausea, vomiting, abdominal pain, melena, or hematochezia. Sitting up in chair and tolerating clear liquids. States she is ready to proceed with egd/colonoscopy   Objective   Vital signs in last 24 hours: Temp:  [97.5 F (36.4 C)-99.8 F (37.7 C)] 97.8 F (36.6 C) (05/07 0739) Pulse Rate:  [68-86] 68 (05/07 0441) Resp:  [11-20] 20 (05/07 0739) BP: (92-106)/(50-57) 92/52 (05/07 0739) SpO2:  [94 %-98 %] 98 % (05/07 0441) Last BM Date : 06/15/22 Last BM recorded by nurses in past 5 days Stool Type: Type 6 (Mushy consistency with ragged edges) (06/15/2022 10:25 AM)  General:   female in no acute distress  Heart:  Regular rate and rhythm; no murmurs Pulm: Clear anteriorly; no wheezing Abdomen: soft, nondistended, normal bowel sounds in all quadrants. Nontender without guarding. No organomegaly appreciated. Extremities:  No edema Neurologic:  Alert and  oriented x4;  No focal deficits.  Psych:  Cooperative. Normal mood and affect.  Intake/Output from previous day: 05/06 0701 - 05/07 0700 In: 3380.6 [P.O.:970; I.V.:1910.6; IV Piggyback:500] Out: -  Intake/Output this shift: Total I/O In: 120 [P.O.:120] Out: -   Studies/Results: ECHOCARDIOGRAM COMPLETE  Result Date: 06/14/2022    ECHOCARDIOGRAM REPORT   Patient Name:   Brittany Pineda Date of Exam: 06/14/2022 Medical Rec #:  [id number]                      Height:       63.0 in Accession #:    [id number]                     Weight:       123.4 lb Date of Birth:  September 01, 1955                      BSA:          1.575 m Patient Age:    67 years                       BP:           113/51 mmHg Patient Gender: F                              HR:           83 bpm. Exam  Location:  Inpatient Procedure: 2D Echo, Color Doppler and Cardiac Doppler Indications:    murmur  History:        Patient has no prior history of Echocardiogram examinations.                 Risk Factors:Diabetes, Hypertension and Dyslipidemia.  Sonographer:    Delcie Roch RDCS Referring Phys: [id number] CARINA M BROWN IMPRESSIONS  1. Left ventricular ejection fraction, by estimation, is 60 to 65%. The left ventricle has normal function. The left ventricle has no regional wall motion abnormalities. Left ventricular diastolic parameters were normal.  2. Right ventricular systolic function is normal. The right ventricular size is normal.  3. The mitral valve is grossly normal. Trivial mitral valve regurgitation.  4. The aortic valve is tricuspid.  Aortic valve regurgitation is not visualized. Aortic valve sclerosis is present, with no evidence of aortic valve stenosis.  5. The inferior vena cava is normal in size with greater than 50% respiratory variability, suggesting right atrial pressure of 3 mmHg. Comparison(s): No prior Echocardiogram. FINDINGS  Left Ventricle: Left ventricular ejection fraction, by estimation, is 60 to 65%. The left ventricle has normal function. The left ventricle has no regional wall motion abnormalities. The left ventricular internal cavity size was normal in size. There is  no left ventricular hypertrophy. Left ventricular diastolic parameters were normal. Right Ventricle: The right ventricular size is normal. No increase in right ventricular wall thickness. Right ventricular systolic function is normal. Left Atrium: Left atrial size was normal in size. Right Atrium: Right atrial size was normal in size. Pericardium: There is no evidence of pericardial effusion. Mitral Valve: The mitral valve is grossly normal. Trivial mitral valve regurgitation. Tricuspid Valve: The tricuspid valve is normal in structure. Tricuspid valve regurgitation is trivial. Aortic Valve: The aortic valve is  tricuspid. Aortic valve regurgitation is not visualized. Aortic valve sclerosis is present, with no evidence of aortic valve stenosis. Pulmonic Valve: The pulmonic valve was not well visualized. Pulmonic valve regurgitation is trivial. Aorta: The aortic root is normal in size and structure. Venous: The inferior vena cava is normal in size with greater than 50% respiratory variability, suggesting right atrial pressure of 3 mmHg. IAS/Shunts: The atrial septum is grossly normal.  LEFT VENTRICLE PLAX 2D LVIDd:         3.70 cm   Diastology LVIDs:         2.50 cm   LV e' medial:    6.53 cm/s LV PW:         1.00 cm   LV E/e' medial:  12.8 LV IVS:        0.80 cm   LV e' lateral:   11.40 cm/s LVOT diam:     1.90 cm   LV E/e' lateral: 7.4 LV SV:         68 LV SV Index:   43 LVOT Area:     2.84 cm  RIGHT VENTRICLE             IVC RV S prime:     19.00 cm/s  IVC diam: 2.20 cm TAPSE (M-mode): 1.8 cm LEFT ATRIUM             Index        RIGHT ATRIUM           Index LA diam:        3.30 cm 2.10 cm/m   RA Area:     12.40 cm LA Vol (A2C):   53.1 ml 33.72 ml/m  RA Volume:   26.50 ml  16.83 ml/m LA Vol (A4C):   42.1 ml 26.73 ml/m LA Biplane Vol: 49.8 ml 31.62 ml/m  AORTIC VALVE LVOT Vmax:   131.00 cm/s LVOT Vmean:  83.700 cm/s LVOT VTI:    0.240 m  AORTA Ao Root diam: 2.90 cm Ao Asc diam:  3.10 cm MITRAL VALVE MV Area (PHT): 3.17 cm    SHUNTS MV Decel Time: 239 msec    Systemic VTI:  0.24 m MV E velocity: 83.90 cm/s  Systemic Diam: 1.90 cm MV A velocity: 77.00 cm/s MV E/A ratio:  1.09 Brittany Flatten MD Electronically signed by Brittany Flatten MD Signature Date/Time: 06/14/2022/3:40:33 PM    Final     Lab Results: Recent Labs  06/14/22 1229 06/15/22 0116 06/16/22 0126  WBC 20.5* 24.1* 22.7*  HGB 8.9* 9.1* 9.4*  HCT 26.2* 26.3* 28.2*  PLT 680* 721* 655*   BMET Recent Labs    06/14/22 0129 06/15/22 0116 06/16/22 0126  NA 133* 134* 132*  K 4.1 3.8 4.0  CL 101 102 103  CO2 22 21* 19*  GLUCOSE 178* 218*  274*  BUN 10 12 9   CREATININE 0.67 0.77 0.70  CALCIUM 7.9* 7.7* 7.5*   LFT Recent Labs    06/14/22 0129  PROT 6.0*  ALBUMIN 1.8*  AST 31  ALT 22  ALKPHOS 92  BILITOT 0.4   PT/INR Recent Labs    06/14/22 0129  LABPROT 15.7*  INR 1.3*     Scheduled Meds:  cyanocobalamin  1,000 mcg Intramuscular Once per day on Tue Thu Sat   ferrous gluconate  324 mg Oral Q breakfast   insulin aspart  0-9 Units Subcutaneous TID WC   insulin glargine-yfgn  6 Units Subcutaneous Daily   levothyroxine  88 mcg Oral QAC breakfast   pantoprazole (PROTONIX) IV  40 mg Intravenous Daily   predniSONE  20 mg Oral Q breakfast   sertraline  50 mg Oral Daily   Continuous Infusions:  sodium chloride 125 mL/hr at 06/16/22 0453   cefTRIAXone (ROCEPHIN)  IV 2 g (06/15/22 1355)       Impression:   Anemia; normocytic-anemia panel mixed - heme negative stool - hgb 9.4 - TIBC 21, plt 723 - TSH 7.7 - B12 166, normal folate - Ferritin 430, iron 9, TIBC 105, sat 9% - BUN 9, Cr. 0.7 - CT ab/pelvis with contrast: normal appearance of colon and stomach, previous cholecystectomy and hysterectomy.   UTI - WBC 22.7, improved - on cefepime - no fever overnight    Plan:   Plan for EGD/Colonoscopy tomorrow. I thoroughly discussed the procedures to include nature, alternatives, benefits, and risks including but not limited to bleeding, perforation, infection, anesthesia/cardiac and pulmonary complications. Patient provides understanding and gave verbal consent to proceed. Continue Protonix 40 mg IV BID. Moviprep, clear liquid diet, NPO at midnight (except prep). Continue daily CBC with transfusion as needed to maintain Hgb >7.     Danaja Lasota Leanna Sato  06/16/2022, 11:23 AM

## 2022-06-16 NOTE — Assessment & Plan Note (Addendum)
Switch to once weekly B12 injection for 2 months, then once monthly at D/C

## 2022-06-16 NOTE — Assessment & Plan Note (Addendum)
Sertraline 50 mg daily

## 2022-06-17 ENCOUNTER — Inpatient Hospital Stay (HOSPITAL_COMMUNITY): Payer: Self-pay

## 2022-06-17 ENCOUNTER — Encounter (HOSPITAL_COMMUNITY): Payer: Self-pay | Admitting: Student

## 2022-06-17 ENCOUNTER — Encounter (HOSPITAL_COMMUNITY)
Admission: EM | Disposition: A | Discharge status: Home Health | Payer: Self-pay | Source: Home / Self Care | Attending: Family Medicine

## 2022-06-17 DIAGNOSIS — K449 Diaphragmatic hernia without obstruction or gangrene: Secondary | ICD-10-CM

## 2022-06-17 DIAGNOSIS — D62 Acute posthemorrhagic anemia: Secondary | ICD-10-CM

## 2022-06-17 DIAGNOSIS — K222 Esophageal obstruction: Secondary | ICD-10-CM

## 2022-06-17 DIAGNOSIS — Z87891 Personal history of nicotine dependence: Secondary | ICD-10-CM

## 2022-06-17 DIAGNOSIS — B9681 Helicobacter pylori [H. pylori] as the cause of diseases classified elsewhere: Secondary | ICD-10-CM

## 2022-06-17 DIAGNOSIS — K2970 Gastritis, unspecified, without bleeding: Secondary | ICD-10-CM

## 2022-06-17 DIAGNOSIS — K2950 Unspecified chronic gastritis without bleeding: Secondary | ICD-10-CM

## 2022-06-17 DIAGNOSIS — I10 Essential (primary) hypertension: Secondary | ICD-10-CM

## 2022-06-17 HISTORY — PX: COLONOSCOPY WITH PROPOFOL: SHX5780

## 2022-06-17 HISTORY — PX: BIOPSY: SHX5522

## 2022-06-17 HISTORY — PX: ESOPHAGOGASTRODUODENOSCOPY: SHX5428

## 2022-06-17 LAB — CBC
HCT: 28.9 % — ABNORMAL LOW (ref 36.0–46.0)
Hemoglobin: 9.9 g/dL — ABNORMAL LOW (ref 12.0–15.0)
MCH: 32.5 pg (ref 26.0–34.0)
MCHC: 34.3 g/dL (ref 30.0–36.0)
MCV: 94.8 fL (ref 80.0–100.0)
Platelets: 982 10*3/uL (ref 150–400)
RBC: 3.05 MIL/uL — ABNORMAL LOW (ref 3.87–5.11)
RDW: 12.2 % (ref 11.5–15.5)
WBC: 25.8 10*3/uL — ABNORMAL HIGH (ref 4.0–10.5)
nRBC: 0 % (ref 0.0–0.2)

## 2022-06-17 LAB — BASIC METABOLIC PANEL
Anion gap: 13 (ref 5–15)
BUN: 13 mg/dL (ref 8–23)
CO2: 18 mmol/L — ABNORMAL LOW (ref 22–32)
Calcium: 8.1 mg/dL — ABNORMAL LOW (ref 8.9–10.3)
Chloride: 105 mmol/L (ref 98–111)
Creatinine, Ser: 0.64 mg/dL (ref 0.44–1.00)
GFR, Estimated: >60 mL/min (ref >60)
Glucose, Bld: 226 mg/dL — ABNORMAL HIGH (ref 70–99)
Potassium: 4.4 mmol/L (ref 3.5–5.1)
Sodium: 136 mmol/L (ref 135–145)

## 2022-06-17 LAB — GLUCOSE, CAPILLARY
Glucose-Capillary: 166 mg/dL — ABNORMAL HIGH (ref 70–99)
Glucose-Capillary: 123 mg/dL — ABNORMAL HIGH (ref 70–99)
Glucose-Capillary: 129 mg/dL — ABNORMAL HIGH (ref 70–99)
Glucose-Capillary: 134 mg/dL — ABNORMAL HIGH (ref 70–99)
Glucose-Capillary: 187 mg/dL — ABNORMAL HIGH (ref 70–99)

## 2022-06-17 SURGERY — EGD (ESOPHAGOGASTRODUODENOSCOPY)
Anesthesia: Monitor Anesthesia Care

## 2022-06-17 SURGERY — COLONOSCOPY
Anesthesia: Monitor Anesthesia Care

## 2022-06-17 MED ORDER — ENOXAPARIN SODIUM 40 MG/0.4ML IJ SOSY
40.0000 mg | PREFILLED_SYRINGE | INTRAMUSCULAR | Status: DC
  Administered 2022-06-17 – 2022-06-18 (×2): 40 mg via SUBCUTANEOUS
  Filled 2022-06-17 (×2): qty 0.4

## 2022-06-17 MED ORDER — LACTATED RINGERS IV SOLN
INTRAVENOUS | Status: AC | PRN
  Administered 2022-06-17: 1000 mL via INTRAVENOUS

## 2022-06-17 MED ORDER — PROPOFOL 500 MG/50ML IV EMUL
INTRAVENOUS | Status: DC | PRN
  Administered 2022-06-17: 100 ug/kg/min via INTRAVENOUS

## 2022-06-17 MED ORDER — PROPOFOL 10 MG/ML IV BOLUS
INTRAVENOUS | Status: DC | PRN
  Administered 2022-06-17: 50 mg via INTRAVENOUS

## 2022-06-17 MED ORDER — PHENYLEPHRINE 80 MCG/ML (10ML) SYRINGE FOR IV PUSH (FOR BLOOD PRESSURE SUPPORT)
PREFILLED_SYRINGE | INTRAVENOUS | Status: DC | PRN
  Administered 2022-06-17 (×5): 80 ug via INTRAVENOUS
  Administered 2022-06-17: 160 ug via INTRAVENOUS
  Administered 2022-06-17: 80 ug via INTRAVENOUS
  Administered 2022-06-17: 160 ug via INTRAVENOUS

## 2022-06-17 SURGICAL SUPPLY — 22 items

## 2022-06-17 NOTE — Op Note (Signed)
[facility] Patient Name: Brittany Pineda Procedure Date : 06/17/2022 MRN: [id number] Attending MD: Beverley Fiedler , MD, [id number] Date of Birth: 1955/09/08 CSN: [id number] Age: 67 Admit Type: Inpatient Procedure:                Colonoscopy Indications:              Weight loss, iron and B12 def Providers:                Carie Caddy. Rhea Belton, MD, Fransisca Connors, Beryle Beams,                            Technician, Priscella Mann, Technician Referring MD:             [facility] Medicines:                Monitored Anesthesia Care Complications:            No immediate complications. Estimated Blood Loss:     Estimated blood loss: none. Procedure:                Pre-Anesthesia Assessment:                           - Prior to the procedure, a History and Physical                            was performed, and patient medications and                            allergies were reviewed. The patient's tolerance of                            previous anesthesia was also reviewed. The risks                            and benefits of the procedure and the sedation                            options and risks were discussed with the patient.                            All questions were answered, and informed consent                            was obtained. Prior Anticoagulants: The patient has                            taken no anticoagulant or antiplatelet agents. ASA                            Grade Assessment: III - A patient with severe                            systemic disease. After reviewing the risks and  benefits, the patient was deemed in satisfactory                            condition to undergo the procedure.                           After obtaining informed consent, the colonoscope                            was passed under direct vision. Throughout the                            procedure, the patient's blood pressure,  pulse, and                            oxygen saturations were monitored continuously. The                            PCF-190TL ([id number]) Olympus colonoscope was                            introduced through the anus and advanced to the                            cecum, identified by appendiceal orifice and                            ileocecal valve. The colonoscopy was performed                            without difficulty. The patient tolerated the                            procedure well. The quality of the bowel                            preparation was good. The ileocecal valve,                            appendiceal orifice, and rectum were photographed. Scope In: 12:27:09 PM Scope Out: 12:39:33 PM Scope Withdrawal Time: 0 hours 9 minutes 50 seconds  Total Procedure Duration: 0 hours 12 minutes 24 seconds  Findings:      The digital rectal exam was normal.      The entire examined colon appeared normal on direct and retroflexion       views. Impression:               - The entire examined colon is normal on direct and                            retroflexion views.                           - No specimens collected. Moderate Sedation:      N/A Recommendation:           -  Return patient to hospital ward for ongoing care.                           - Resume previous diet.                           - Continue present medications.                           - Repeat colonoscopy in 10 years for screening                            purposes.                           - See the other procedure note for documentation of                            additional recommendations.                           - Inpatient GI service signing off, call if                            questions. Procedure Code(s):        --- Professional ---                           [phone], Colonoscopy, flexible; diagnostic, including                            collection of specimen(s) by brushing or washing,                             when performed (separate procedure) Diagnosis Code(s):        --- Professional ---                           R63.4, Abnormal weight loss CPT copyright 2022 American [employer] Association. All rights reserved. The codes documented in this report are preliminary and upon coder review may  be revised to meet current compliance requirements. Beverley Fiedler, MD 06/17/2022 12:43:30 PM This report has been signed electronically. Number of Addenda: 0

## 2022-06-17 NOTE — Progress Notes (Addendum)
Daily Progress Note Intern Pager: [phone]  Patient name: Brittany Pineda Medical record number: [id number] Date of birth: 1955-09-26 Age: 67 y.o. Gender: female  Primary Care Provider: Shelby Mattocks, DO Consultants: GI Code Status: FULL  Pt Overview and Major Events to Date:  5/4: Admitted  5/8: EGD, colonoscopy  Assessment and Plan: Brittany Pineda is a 67 y.o. female p/f myriad of symptoms with weight loss, fatigue, tarry stools, and reduced appetite concerning for malignancy or possibly PMR. EGD and colonoscopy today.   Encounter completed using [employer] video interpreting services in Spanish. Interpreter Victorino Dike [phone].  Weakness Improved today, possibly 2/2 steroid course for PMR. Likely multifactorial: Anemia (blood loss v B12 deficiency), Hypothyroidism, and acute illness. With Leukocytosis and thrombocytosis (not responded to abx), and weight loss there is still concern for malignancy.  -F/u EGD/Colonoscopy -OP Heme, will ask if she needs ASA 81 given her thrombocytosis   Essential hypertension Pressures remain soft.At home takes amlodipine 10 mg and losartan 25 mg. - Continue to hold home meds - mIVF, reduce after procedure today   Hypothyroidism -Continue synthroid 88 mcg -Repeat TSH 6-8 weeks outpatient  Controlled type 2 diabetes mellitus without complication, without long-term current use of insulin (HCC) Continue current regimen. Will likely need additional coverage at DC. Consider 5 mg glipizide short term during steroid course, vs. Long acting insulin at home. - CBGs - Semglee 6U daily - SSI - Restart home metformin at dispo  Hyperlipidemia Needs statin for 2ndary prevention. -Consider starting Crestor over simvastatin at DC  Depressed mood Endorses sadness, LOI, poor sleep. Worsening over several years. Would like to see therapy and is interested in medical treatment. -TOC consult for resources -Sertraline 50 mg  daily  B12 deficiency Continue12 injection while admitted (TH,Sat). Switch to once weekly B12 injection for 2 months, then once monthly at D/C  Thrombocytosis [phone]. Unclear if acute phase reactant from blood loss v. Infection.  Spoke with Dr. Mosetta Putt today, do not recommend aspirin this time due to bleeding.  Will follow-up in approximately 1 week outpatient-pending appointment. - Follow-up heme outpatient  Anemia Stable 9.1>9.4>9.9. Pending EGD/colonoscopy with GI -GI consulted, appreciate recs -Continue IV protonix   FEN/GI: NPO, bowel prep PPx: SCDs Dispo:Home pending clinical improvement .  Subjective:  No acute concerns. Reports weakness is much better.  Objective: Temp:  [97.4 F (36.3 C)-98.3 F (36.8 C)] 97.8 F (36.6 C) (05/08 1042) Pulse Rate:  [71-83] 83 (05/08 1042) Resp:  [12-16] 12 (05/08 1042) BP: (92-115)/(52-61) 115/59 (05/08 1042) SpO2:  [96 %-99 %] 96 % (05/08 1042) Weight:  [56 kg] 56 kg (05/08 1042) Physical Exam: General: NAD, resting in bed Cardiovascular: RRR, no murmurs Respiratory: CTAB, normal wob on RA Abdomen: Soft, not tender, not distended Extremities: Moving all 4 extremities, no edema  Laboratory: Most recent CBC Lab Results  Component Value Date   WBC 25.8 (H) 06/17/2022   HGB 9.9 (L) 06/17/2022   HCT 28.9 (L) 06/17/2022   MCV 94.8 06/17/2022   PLT 982 (HH) 06/17/2022   Most recent BMP    Latest Ref Rng & Units 06/17/2022    1:03 AM  BMP  Glucose 70 - 99 mg/dL 784   BUN 8 - 23 mg/dL 13   Creatinine 6.96 - 1.00 mg/dL 2.95   Sodium 284 - 132 mmol/L 136   Potassium 3.5 - 5.1 mmol/L 4.4   Chloride 98 - 111 mmol/L 105   CO2 22 - 32 mmol/L 18  Calcium 8.9 - 10.3 mg/dL 8.1    Tiffany Kocher, DO 06/17/2022, 11:56 AM  PGY-1, [facility] FPTS Intern pager: [phone], text pages welcome Secure chat group [facility] Teaching Service

## 2022-06-17 NOTE — Plan of Care (Signed)
Plan of care is reviewed.  Problem: Clinical Measurements: Controlled type 2 diabetes mellitus without complication. We check CBG at [facility] with insulin sensitive sliding scale, and long acting insulin. Goal: Diagnostic test results will improve Outcome: Progressing: No signs of hypoglycemia.   Problem: Clinical Measurements: Thrombocytosis, with anemia.  Latest Reference Range & Units Most Recent  WBC 4.0 - 10.5 K/uL 25.8 (H) 06/17/22 01:03  RBC 3.87 - 5.11 MIL/uL 3.05 (L) 06/17/22 01:03  Hemoglobin 12.0 - 15.0 g/dL 9.9 (L) 4/0/98 11:91  HCT 36.0 - 46.0 % 28.9 (L) 06/17/22 01:03  MCV 80.0 - 100.0 fL 94.8 06/17/22 01:03  MCH 26.0 - 34.0 pg 32.5 06/17/22 01:03  MCHC 30.0 - 36.0 g/dL 47.8 03/20/54 21:30  RDW 11.5 - 15.5 % 12.2 06/17/22 01:03  Platelets 150 - 400 K/uL 982 (HH) 06/17/22 01:03  nRBC 0.0 - 0.2 % 0.0 06/17/22 01:03  (HH): Data is critically high (H): Data is abnormally high (L): Data is abnormally lowPlatelet  Goal: Diagnostic test results will improve Outcome: Progressing: Unclear etiology of thrombocytosis, MD will consult hematologist as an out patient for further evaluation.    Problem: Clinical Measurements: Goal: Respiratory complications will improve Outcome: Progressing   Problem: Clinical Measurements: BP remains soft. MD continue to whole home med. Goal: Cardiovascular complication will be avoided Outcome: Progressing: hemodynamically remains stable. BP 92/54-104/54 mmHg. HR 70s. No acute distress.    Problem: Activity:  Goal: Risk for activity intolerance will decrease Outcome: Progressing: ambulate independently after set-up in room or bedside commode.  Pt is well tolerated short distance of ambulation.    Problem: Elimination: Goal: Will not experience complications related to bowel motility Outcome: Progressing; Pt is compliant for bowel prep solutions for Endoscopy/colonoscopy which schedule at 1:30 pm. Pt has multiple BMs tonight. No bloody or black tarry stool  presented.    Problem: Pain Managment: Goal: General experience of comfort will improve Outcome: Progressing: Denies abdominal pain.    Problem: Fluid Volume: On clear liquid diet, with bowel prep solutions, continue on IV NSS at 75 ml/hr. Will NPO after 05:30 am.  Goal: Ability to maintain a balanced intake and output will improve Outcome: Progressing: No signs of dehydration.    Filiberto Pinks, RN

## 2022-06-17 NOTE — Interval H&P Note (Signed)
History and Physical Interval Note: For EGD and colonoscopy today to evaluate mixed anemia with low B12 and iron, weight loss  Using the medical Spanish interpreter via the iPad the nature of the procedure, as well as the risks, benefits, and alternatives were carefully and thoroughly reviewed with the patient. Ample time for discussion and questions allowed. The patient understood, was satisfied, and agreed to proceed.    06/17/2022 11:36 AM  Brittany Pineda  has presented today for surgery, with the diagnosis of anemia.  The various methods of treatment have been discussed with the patient and family. After consideration of risks, benefits and other options for treatment, the patient has consented to  Procedure(s): ESOPHAGOGASTRODUODENOSCOPY (EGD) (N/A) COLONOSCOPY WITH PROPOFOL (N/A) as a surgical intervention.  The patient's history has been reviewed, patient examined, no change in status, stable for surgery.  I have reviewed the patient's chart and labs.  Questions were answered to the patient's satisfaction.     Carie Caddy [name]

## 2022-06-17 NOTE — Transfer of Care (Signed)
Immediate Anesthesia Transfer of Care Note  Patient: Brittany Pineda  Procedure(s) Performed: ESOPHAGOGASTRODUODENOSCOPY (EGD) COLONOSCOPY WITH PROPOFOL BIOPSY  Patient Location: PACU and Endoscopy Unit  Anesthesia Type:MAC  Level of Consciousness: awake  Airway & Oxygen Therapy: Patient Spontanous Breathing  Post-op Assessment: Report given to RN and Post -op Vital signs reviewed and stable  Post vital signs: stable  Last Vitals:  Vitals Value Taken Time  BP 99/59 06/17/22 1300  Temp 36.5 C 06/17/22 1248  Pulse 69 06/17/22 1307  Resp 12 06/17/22 1307  SpO2 95 % 06/17/22 1307  Vitals shown include unvalidated device data.  Last Pain:  Vitals:   06/17/22 1300  TempSrc:   PainSc: 0-No pain         Complications: No notable events documented.

## 2022-06-17 NOTE — Progress Notes (Signed)
Pt returned to 4E01 via wheelchair after endo procedures.

## 2022-06-17 NOTE — Plan of Care (Signed)
  Problem: Pain Managment: Goal: General experience of comfort will improve Outcome: Progressing   Problem: Safety: Goal: Ability to remain free from injury will improve Outcome: Progressing   Problem: Skin Integrity: Goal: Risk for impaired skin integrity will decrease Outcome: Progressing   

## 2022-06-17 NOTE — Anesthesia Postprocedure Evaluation (Signed)
Anesthesia Post Note  Patient: Brittany Pineda  Procedure(s) Performed: ESOPHAGOGASTRODUODENOSCOPY (EGD) COLONOSCOPY WITH PROPOFOL BIOPSY     Patient location during evaluation: Endoscopy Anesthesia Type: MAC Level of consciousness: awake and alert Pain management: pain level controlled Vital Signs Assessment: post-procedure vital signs reviewed and stable Respiratory status: spontaneous breathing, nonlabored ventilation and respiratory function stable Cardiovascular status: stable and blood pressure returned to baseline Postop Assessment: no apparent nausea or vomiting Anesthetic complications: no  No notable events documented.  Last Vitals:  Vitals:   06/17/22 1300 06/17/22 1330  BP: (!) 99/59 106/61  Pulse: 69   Resp: 14 16  Temp:  36.6 C  SpO2: 97% 95%    Last Pain:  Vitals:   06/17/22 1330  TempSrc: Oral  PainSc: 0-No pain                 Jamirah Zelaya,W. EDMOND

## 2022-06-17 NOTE — Anesthesia Preprocedure Evaluation (Addendum)
Anesthesia Evaluation  Patient identified by MRN, date of birth, ID band Patient awake    Reviewed: Allergy & Precautions, H&P , NPO status , Patient's Chart, lab work & pertinent test results  History of Anesthesia Complications (+) PONV and history of anesthetic complications  Airway Mallampati: III  TM Distance: >3 FB Neck ROM: Full    Dental no notable dental hx. (+) Teeth Intact, Dental Advisory Given   Pulmonary former smoker   Pulmonary exam normal breath sounds clear to auscultation       Cardiovascular hypertension, Pt. on medications  Rhythm:Regular Rate:Normal     Neuro/Psych   Anxiety Depression    negative neurological ROS     GI/Hepatic negative GI ROS, Neg liver ROS,,,  Endo/Other  diabetes, Type 2, Oral Hypoglycemic AgentsHypothyroidism    Renal/GU negative Renal ROS  negative genitourinary   Musculoskeletal   Abdominal   Peds  Hematology  (+) Blood dyscrasia, anemia   Anesthesia Other Findings   Reproductive/Obstetrics negative OB ROS                             Anesthesia Physical Anesthesia Plan  ASA: 2  Anesthesia Plan: MAC   Post-op Pain Management: Minimal or no pain anticipated   Induction: Intravenous  PONV Risk Score and Plan: 3 and Propofol infusion  Airway Management Planned: Natural Airway and Nasal Cannula  Additional Equipment:   Intra-op Plan:   Post-operative Plan:   Informed Consent: I have reviewed the patients History and Physical, chart, labs and discussed the procedure including the risks, benefits and alternatives for the proposed anesthesia with the patient or authorized representative who has indicated his/her understanding and acceptance.     Dental advisory given and Interpreter used for interveiw  Plan Discussed with: CRNA  Anesthesia Plan Comments:        Anesthesia Quick Evaluation

## 2022-06-17 NOTE — Progress Notes (Signed)
PT Cancellation Note  Patient Details Name: Brittany Pineda MRN: [id number] DOB: 1956-01-31   Cancelled Treatment:    Reason Eval/Treat Not Completed: (P) Patient at procedure or test/unavailable (Pt off unit for ESOPHAGOGASTRODUODENOSCOPY (EGD) and colonoscopy.) Will continue efforts per PT plan of care as schedule permits. Interpreter phone # written on board for staff to get live interpreter for her when she returns if needed.  Dorathy Kinsman [place] 06/17/2022, 11:41 AM

## 2022-06-17 NOTE — TOC Initial Note (Signed)
Transition of Care [facility]) - Initial/Assessment Note    Patient Details  Name: Brittany Pineda MRN: [id number] Date of Birth: December 07, 1955  Transition of Care [facility]) CM/SW Contact:    Eduard Roux, LCSW Phone Number: 06/17/2022, 10:52 AM  Clinical Narrative:                  CSW spoke with patient's daughter, Alcario Drought. CSW introduced self and explained role. She advised the patient lives with her other daughter Vernona Rieger. She states she lives in [place]. She requested CSW call patient's daughter Vernona Rieger- she advised she does not speak english and will need interpreter.   TOC will continue to follow and assist with discharge planning.  Antony Blackbird, MSW, LCSW Clinical Social Worker     Barriers to Discharge: Inadequate or no insurance, Continued Medical Work up (no payor source)   Patient Goals and CMS Choice            Expected Discharge Plan and Services                                              Prior Living Arrangements/Services                       Activities of Daily Living Home Assistive Devices/Equipment: None ADL Screening (condition at time of admission) Patient's cognitive ability adequate to safely complete daily activities?: Yes Is the patient deaf or have difficulty hearing?: No Does the patient have difficulty seeing, even when wearing glasses/contacts?: No Does the patient have difficulty concentrating, remembering, or making decisions?: No Patient able to express need for assistance with ADLs?: Yes Does the patient have difficulty dressing or bathing?: No Independently performs ADLs?: No Communication: Independent Dressing (OT): Independent Grooming: Independent Feeding: Independent Bathing: Independent Toileting: Independent In/Out Bed: Needs assistance Is this a change from baseline?: Change from baseline, expected to last <3 days Walks in Home: Needs assistance Is this a change from baseline?: Change from  baseline, expected to last <3 days Does the patient have difficulty walking or climbing stairs?: No Weakness of Legs: Both Weakness of Arms/Hands: None  Permission Sought/Granted                  Emotional Assessment              Admission diagnosis:  Weakness [R53.1] GI bleed [K92.2] Patient Active Problem List   Diagnosis Date Noted   Depressed mood 06/16/2022   GI bleed 06/15/2022   UTI (urinary tract infection) 06/15/2022   Thrombocytosis 06/15/2022   B12 deficiency 06/15/2022   Sepsis without acute organ dysfunction (HCC) 06/14/2022   Anemia 06/14/2022   Loss of weight 06/14/2022   Decreased appetite 06/14/2022   Weakness 06/13/2022   Fatigue 06/08/2022   Health care maintenance 10/27/2018   Need for financial support 10/27/2018   Anxiety 08/23/2018   Diabetic polyneuropathy associated with type 2 diabetes mellitus (HCC) 02/23/2017   Essential hypertension 10/22/2016   Onychomycosis 04/07/2016   Depression 04/03/2014   Hypothyroidism 03/13/2014   Melena 08/10/2011   Hyperlipidemia 07/22/2011   Controlled type 2 diabetes mellitus without complication, without long-term current use of insulin (HCC) 07/22/2011   PCP:  Shelby Mattocks, DO Pharmacy:   [id number] [address] [place], [place] - [address] [place] [address] Phone: [phone] Fax:  [phone]  [facility] [address], [address] [place] [address] Phone: [phone] Fax: [phone]     Social Determinants of Health (SDOH) Social History: SDOH Screenings   Food Insecurity: No Food Insecurity (06/13/2022)  Housing: Medium Risk (06/13/2022)  Transportation Needs: No Transportation Needs (06/13/2022)  Utilities: Not At Risk (06/13/2022)  Depression (PHQ2-9): Medium Risk (06/08/2022)  Tobacco Use: Medium Risk (06/17/2022)   SDOH Interventions:     Readmission Risk Interventions      No data to display

## 2022-06-17 NOTE — Op Note (Signed)
[facility] Patient Name: Brittany Pineda Procedure Date : 06/17/2022 MRN: [id number] Attending MD: Beverley Fiedler , MD, [id number] Date of Birth: 07/25/1955 CSN: [id number] Age: 67 Admit Type: Inpatient Procedure:                Upper GI endoscopy Indications:              Acute post hemorrhagic anemia, Anorexia, Weight                            loss, iron and B12 def Providers:                Carie Caddy. Rhea Belton, MD, Fransisca Connors, Beryle Beams,                            Technician, Priscella Mann, Technician Referring MD:             [facility] Medicines:                Monitored Anesthesia Care Complications:            No immediate complications. Estimated Blood Loss:     Estimated blood loss was minimal. Procedure:                Pre-Anesthesia Assessment:                           - Prior to the procedure, a History and Physical                            was performed, and patient medications and                            allergies were reviewed. The patient's tolerance of                            previous anesthesia was also reviewed. The risks                            and benefits of the procedure and the sedation                            options and risks were discussed with the patient.                            All questions were answered, and informed consent                            was obtained. Prior Anticoagulants: The patient has                            taken no anticoagulant or antiplatelet agents. ASA                            Grade Assessment: III - A patient with severe  systemic disease. After reviewing the risks and                            benefits, the patient was deemed in satisfactory                            condition to undergo the procedure.                           After obtaining informed consent, the endoscope was                            passed under direct vision.  Throughout the                            procedure, the patient's blood pressure, pulse, and                            oxygen saturations were monitored continuously. The                            [id number]-H190 ([id number]) Olympus endoscope was introduced                            through the mouth, and advanced to the second part                            of duodenum. The upper GI endoscopy was                            accomplished without difficulty. The patient                            tolerated the procedure well. Scope In: Scope Out: Findings:      Normal mucosa was found in the entire esophagus.      A widely patent and partial Schatzki ring was found at the       gastroesophageal junction.      A 1 cm hiatal hernia was present.      Diffuse moderate inflammation characterized by congestion (edema) and       erythema was found in the entire examined stomach. Biopsies were taken       with a cold forceps for histology and Helicobacter pylori testing.      The examined duodenum was normal. Biopsies for histology were taken with       a cold forceps for evaluation of celiac disease. Impression:               - Normal mucosa was found in the entire esophagus.                           - Widely patent and partial Schatzki ring.                           - 1 cm hiatal hernia.                           -  Gastritis. Biopsied.                           - Normal examined duodenum. Biopsied. Moderate Sedation:      N/A Recommendation:           - Return patient to hospital ward for ongoing care.                           - Advance diet as tolerated.                           - Continue present medications.                           - Await pathology results.                           - Replace iron and B12 and monitor response.                           - See the other procedure note for documentation of                            additional recommendations. Procedure Code(s):         --- Professional ---                           [phone], Esophagogastroduodenoscopy, flexible,                            transoral; with biopsy, single or multiple Diagnosis Code(s):        --- Professional ---                           K22.2, Esophageal obstruction                           K44.9, Diaphragmatic hernia without obstruction or                            gangrene                           K29.70, Gastritis, unspecified, without bleeding                           D62, Acute posthemorrhagic anemia                           R63.0, Anorexia                           R63.4, Abnormal weight loss CPT copyright 2022 American Medical Association. All rights reserved. The codes documented in this report are preliminary and upon coder review may  be revised to meet current compliance requirements. Beverley Fiedler, MD 06/17/2022 12:24:54 PM This report has been signed electronically. Number of Addenda: 0

## 2022-06-18 LAB — BASIC METABOLIC PANEL
Anion gap: 7 (ref 5–15)
BUN: 10 mg/dL (ref 8–23)
CO2: 19 mmol/L — ABNORMAL LOW (ref 22–32)
Calcium: 7.5 mg/dL — ABNORMAL LOW (ref 8.9–10.3)
Chloride: 109 mmol/L (ref 98–111)
Creatinine, Ser: 0.56 mg/dL (ref 0.44–1.00)
GFR, Estimated: >60 mL/min (ref >60)
Glucose, Bld: 147 mg/dL — ABNORMAL HIGH (ref 70–99)
Potassium: 3.5 mmol/L (ref 3.5–5.1)
Sodium: 135 mmol/L (ref 135–145)

## 2022-06-18 LAB — CBC
HCT: 31 % — ABNORMAL LOW (ref 36.0–46.0)
Hemoglobin: 9.9 g/dL — ABNORMAL LOW (ref 12.0–15.0)
MCH: 31.3 pg (ref 26.0–34.0)
MCHC: 31.9 g/dL (ref 30.0–36.0)
MCV: 98.1 fL (ref 80.0–100.0)
Platelets: 944 10*3/uL (ref 150–400)
RBC: 3.16 MIL/uL — ABNORMAL LOW (ref 3.87–5.11)
RDW: 12.1 % (ref 11.5–15.5)
WBC: 21 10*3/uL — ABNORMAL HIGH (ref 4.0–10.5)
nRBC: 0 % (ref 0.0–0.2)

## 2022-06-18 LAB — GLUCOSE, CAPILLARY
Glucose-Capillary: 113 mg/dL — ABNORMAL HIGH (ref 70–99)
Glucose-Capillary: 170 mg/dL — ABNORMAL HIGH (ref 70–99)
Glucose-Capillary: 182 mg/dL — ABNORMAL HIGH (ref 70–99)
Glucose-Capillary: 270 mg/dL — ABNORMAL HIGH (ref 70–99)

## 2022-06-18 LAB — CULTURE, BLOOD (ROUTINE X 2): Culture: NO GROWTH

## 2022-06-18 LAB — CYCLIC CITRUL PEPTIDE ANTIBODY, IGG/IGA: CCP Antibodies IgG/IgA: 3 units (ref 0–19)

## 2022-06-18 MED ORDER — ROSUVASTATIN CALCIUM 20 MG PO TABS
20.0000 mg | ORAL_TABLET | Freq: Every day | ORAL | Status: DC
  Administered 2022-06-18 – 2022-06-19 (×2): 20 mg via ORAL
  Filled 2022-06-18 (×2): qty 1

## 2022-06-18 MED ORDER — POTASSIUM CHLORIDE CRYS ER 20 MEQ PO TBCR
40.0000 meq | EXTENDED_RELEASE_TABLET | Freq: Once | ORAL | Status: AC
  Administered 2022-06-18: 40 meq via ORAL
  Filled 2022-06-18: qty 2

## 2022-06-18 NOTE — Progress Notes (Addendum)
Physical Therapy Treatment Patient Details Name: Brittany Pineda MRN: [id number] DOB: 1955/09/10 Today's Date: 06/18/2022   History of Present Illness 67 y.o. female presenting to the ED 5/4 with report of 2 weeks of fatigue, unintentional weight loss, poor p.o. intake, black tarry stools. Pt admitted for further testing. PMH: HTN, DMII, hypothyroidism    PT Comments    Pt received in supine, agreeable to therapy session and with good participation and fair tolerance for transfer and gait training. Pt activity tolerance limited due to symptomatic orthostatic hypotension, RN/MD notified. Pt given supine/seated/standing HEP handout (in [employer], see Reference section) for strengthening to perform acutely and after DC. Pt may benefit from TED hose trial to see if this improves hemodynamic stability and standing tolerance. Pt reports severe fatigue 8/10 modified RPE at end of session. Pt continues to benefit from PT services to progress toward functional mobility goals, not yet able to perform stairs due to weakness/fatigue will need additional session following date, pt has 3 STE daughter's home where she will be staying with 24/7 supervision/assist.    06/18/22 1034  Vital Signs  Patient Position (if appropriate) Orthostatic Vitals  Orthostatic Lying   BP- Lying 110/54  Pulse- Lying 75  Orthostatic Sitting  BP- Sitting (!) 86/56 (legs feel weak/pt lightheaded)  Pulse- Sitting 84  Orthostatic Standing at 0 minutes  BP- Standing at 0 minutes 107/54  Pulse- Standing at 0 minutes 74    Recommendations for follow up therapy are one component of a multi-disciplinary discharge planning process, led by the attending physician.  Recommendations may be updated based on patient status, additional functional criteria and insurance authorization.  Follow Up Recommendations  Can patient physically be transported by private vehicle: Yes    Assistance Recommended at Discharge Intermittent  Supervision/Assistance  Patient can return home with the following A little help with bathing/dressing/bathroom;Assistance with cooking/housework;Assist for transportation;Help with stairs or ramp for entrance;A little help with walking and/or transfers   Equipment Recommendations  Rolling walker (2 wheels);BSC/3in1;Other (comment) (orthostatic, not consistently able to walk to bathroom if symptomatic)    Recommendations for Other Services       Precautions / Restrictions Precautions Precautions: Fall Precaution Comments: orthostatic hypotension, watch BP Restrictions Weight Bearing Restrictions: No     Mobility  Bed Mobility Overal bed mobility: Needs Assistance Bed Mobility: Supine to Sit     Supine to sit: Modified independent (Device/Increase time)     General bed mobility comments: increased time and use of bed features    Transfers Overall transfer level: Needs assistance Equipment used: Rolling walker (2 wheels) Transfers: Sit to/from Stand Sit to Stand: Min guard           General transfer comment: cues for safe hand placement with poor carryover needs cues each time prior to sitting/standing to push from/reach back for surfaces for safety.    Ambulation/Gait Ambulation/Gait assistance: Min guard Gait Distance (Feet): 175 Feet Assistive device: Rolling walker (2 wheels) Gait Pattern/deviations: Step-through pattern, Decreased stride length, Trunk flexed Gait velocity: decr Gait velocity interpretation: <1.31 ft/sec, indicative of household ambulator   General Gait Details: Slow/cautious gait pattern. Pt very reliant on RW, reports tired and feeling weak, but walks further than initial planned distance. No LOB throughout, possible mild lightheadedness (interpreter/delay). Not significantly antalgic.   Stairs Stairs:  (pt defer, severe fatigue)           Wheelchair Mobility    Modified Rankin (Stroke Patients Only)       Balance  Overall  balance assessment: Mild deficits observed, not formally tested                                          Cognition Arousal/Alertness: Awake/alert Behavior During Therapy: Flat affect Overall Cognitive Status: Difficult to assess                                 General Comments: Pt agreeable to participate in session with encouragement, daughter Vernona Rieger present and states she plans to stay with her at home. Some depressed appearing affect        Exercises Other Exercises Other Exercises: Seated BLE AROM: ankle pumps, LAQ, hip flexion x10 reps ea    General Comments General comments (skin integrity, edema, etc.): BP 110/54 (71) HR 75 bpm seated; BP 86/56 (65) HR 84 bpm standing; BP 107/54 HR 74 bpm seated post-exertion. MD/RN notified of pt's orthostatic hypotension, may benefit from TED hose.      Pertinent Vitals/Pain Pain Assessment Pain Assessment: 0-10 Pain Score: 2  Pain Location: bil feet Pain Descriptors / Indicators: Discomfort Pain Intervention(s): Monitored during session, Limited activity within patient's tolerance, Repositioned           PT Goals (current goals can now be found in the care plan section) Acute Rehab PT Goals Patient Stated Goal: decrease pain PT Goal Formulation: With patient Time For Goal Achievement: 06/28/22 Progress towards PT goals: Progressing toward goals    Frequency    Min 3X/week      PT Plan Current plan remains appropriate       AM-PAC PT "6 Clicks" Mobility   Outcome Measure  Help needed turning from your back to your side while in a flat bed without using bedrails?: None Help needed moving from lying on your back to sitting on the side of a flat bed without using bedrails?: None Help needed moving to and from a bed to a chair (including a wheelchair)?: A Little Help needed standing up from a chair using your arms (e.g., wheelchair or bedside chair)?: A Little Help needed to walk in  hospital room?: A Little Help needed climbing 3-5 steps with a railing? : Total 6 Click Score: 18    End of Session Equipment Utilized During Treatment: Gait belt Activity Tolerance: Patient limited by fatigue;Treatment limited secondary to medical complications (Comment);Other (comment) (symptomatic orthostatic hypotension) Patient left: in chair;with call bell/phone within reach;with family/visitor present;Other (comment) (dtr Vernona Rieger present so left chair alarm off, pt agreeable to use call bell prior to OOB) Nurse Communication: Mobility status;Other (comment) (orthostatic hypotension needs TED hose) PT Visit Diagnosis: Muscle weakness (generalized) (M62.81);Difficulty in walking, not elsewhere classified (R26.2);Pain Pain - part of body: Leg;Ankle and joints of foot     Time: 1000-1034 PT Time Calculation (min) (ACUTE ONLY): 34 min  Charges:  $Gait Training: 8-22 mins $Therapeutic Activity: 8-22 mins                     Staton Markey P., PTA Acute Rehabilitation Services Secure Chat Preferred 9a-5:30pm Office: [phone]    Dorathy Kinsman [facility] 06/18/2022, 10:59 AM

## 2022-06-18 NOTE — Discharge Instructions (Addendum)
Dear Brittany Pineda,  Thank you for letting us participate in your care.  You were hospitalized for weakness and anemia. We found that your thyroid level was low, and increased your dose of synthroid. We found your vitamin B12 was low, and this can cause anemia, so you will receive vitamin injections once a week for 2 months. We did not find any source of bleeding on the scope procedures performed by GI. We started a medication called sertraline for depressed mood. Additionally you were started on prednisone for possible muscle weakness related to polymyalgia rheumatica. You will need to follow-up with PCP to determine if this medication is still necessary. Additionally your white blood cells and platelets are still elevated. We do not have a perfect answer as to why. You will receive a phone call to schedule an appointment with Hematology so they can do further work-up.  We are so glad you are feeling better!  POST-HOSPITAL & CARE INSTRUCTIONS Continue to prednisone 20 mg daily. Your PCP will determine if this medication should be continued. Continue to take sertraline 50 mg daily. Your PCP can increase the dose as necessary. Follow-up with your PCP to schedule your vitamin B-12 shots Continue to take your synthroid Please expect a phone call to schedule appointment with hematology DO NOT take your blood pressure medication: Losartan and amlodipine. Your PCP may decide to restart. We started Crestor. Do not take SIMvastatin. Go to your follow up appointments (listed below)  Gracias por dejarnos participar en su cuidado.  Fue hospitalizado por debilidad y anemia. Descubrimos que su nivel de tiroides era bajo y aumentamos su dosis de synthroid. Descubrimos que su nivel de vitamina B12 era bajo y esto puede causar anemia, por lo que recibir inyecciones de vitamina una vez a la semana durante 2 meses. No encontramos ninguna fuente de sangrado en los procedimientos de alcance  realizados por GI. Comenzamos con un medicamento llamado sertralina para el estado de nimo deprimido. Adems, comenz a tomar prednisona por una posible debilidad muscular relacionada con la polimialgia reumtica. Deber realizar un seguimiento con su PCP para determinar si este medicamento an es necesario. Adems, sus glbulos blancos y plaquetas todava estn elevados. No tenemos una respuesta perfecta de por qu. Recibir una llamada telefnica para programar una cita con Hematologa para que puedan realizar ms [employer].  Estamos muy contentos de que te sientas mejor!  INSTRUCCIONES DE CUIDADO Y POSHOSPITALARIO 1. Contine con prednisona 20 mg al da. Su PCP determinar si se debe continuar con este medicamento. 2. Contine tomando 50 mg de sertralina al [employer]. Su PCP puede aumentar la dosis segn sea necesario. 3. Haga un seguimiento con su PCP para programar sus inyecciones de vitamina B-12. 4. Contine tomando su synthroid 5. Espere una llamada telefnica para programar una cita con hematologa. 6. NO tome sus medicamentos para la presin arterial: losartn y amlodipino. Su PCP puede decidir reiniciar. 7. Por favor comience a tomar Crestor. NO tome SIMvastatin 8. Vaya a sus citas de seguimiento (enumeradas a continuacin)   DOCTOR'S APPOINTMENT   Future Appointments  Date Time Provider Department Center  06/23/2022  3:10 PM Shelby Mattocks, DO [facility]    Take care and be well!  Family Medicine Teaching Service Inpatient Team [place]  [facility]  [address] [place], [place] [address] [phone]

## 2022-06-18 NOTE — Progress Notes (Signed)
Physical Therapy Treatment Patient Details Name: Brittany Pineda MRN: [id number] DOB: 25-Nov-1955 Today's Date: 06/18/2022   History of Present Illness 67 y.o. female presenting to the ED 5/4 with report of 2 weeks of fatigue, unintentional weight loss, poor [place]. intake, black tarry stools. Pt admitted for further testing. PMH: HTN, DMII, hypothyroidism    PT Comments    Pt received in supine, agreeable to therapy session per MD request after RN obtained TED hose and assisted pt to don them. 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 Pt performed household distance gait trial with RW and x5 steps with up to minA and single rail. Pt mildly impulsive with slow processing of some cues but good effort for all tasks. Pt reports 3/10 modified RPE (fatigue) at end of session. BP 113/64 supine HR 77 bpm prior to OOB and BP 99/58 HR 80 standing after ~6 mins with TED hose donned, no lightheadedness reported and mild to moderate BLE fatigue. MD/Case manager notified of pt progress and DME needs, also discussed disposition with supervising PT Zain B and updated below. Pt continues to benefit from PT services to progress toward functional mobility goals.    Recommendations for follow up therapy are one component of a multi-disciplinary discharge planning process, led by the attending physician.  Recommendations may be updated based on patient status, additional functional criteria and insurance authorization.  Follow Up Recommendations  Can patient physically be transported by private vehicle: Yes    Assistance Recommended at Discharge Intermittent Supervision/Assistance  Patient can return home with the following A little help with bathing/dressing/bathroom;Assistance with cooking/housework;Assist for transportation;Help with stairs or ramp for entrance;A  little help with walking and/or transfers   Equipment Recommendations  Rolling walker (2 wheels);BSC/3in1;Other (comment) (orthostatic, not consistently able to walk to bathroom if symptomatic)    Recommendations for Other Services       Precautions / Restrictions Precautions Precautions: Fall Precaution Comments: orthostatic hypotension, watch BP Required Braces or Orthoses:  (TED hose for OOB mobility) Restrictions Weight Bearing Restrictions: No     Mobility  Bed Mobility Overal bed mobility: Needs Assistance Bed Mobility: Supine to Sit     Supine to sit: Modified independent (Device/Increase time)     General bed mobility comments: increased time and use of bed features    Transfers Overall transfer level: Needs assistance Equipment used: Rolling walker (2 wheels) Transfers: Sit to/from Stand Sit to Stand: Supervision           General transfer comment: cues for safe hand placement prior to sitting/standing.    Ambulation/Gait Ambulation/Gait assistance: Min guard Gait Distance (Feet): 100 Feet Assistive device: Rolling walker (2 wheels) Gait Pattern/deviations: Step-through pattern, Decreased stride length Gait velocity: decr Gait velocity interpretation: <1.31 ft/sec, indicative of household ambulator   General Gait Details: Slow/cautious gait pattern. Pt very reliant on RW due to c/o fatigue but states she feels less weak than in AM. No buckling, BP soft but stable post-exertion standing at bedside. HR WFL throughout. 3/10 modified RPE at end of trial.   Stairs Stairs: Yes Stairs assistance: Min assist Stair Management: One rail Left, Alternating pattern, Forwards Number of Stairs: 5 General stair comments: HHA on side opposite rail, pt cued for step-to pattern due to recent fatigue but pt performs with reciprocal pattern despite cues, mildly unsteady needing minA for stability/safety.      Balance  Overall balance assessment: Mild deficits  observed, not formally tested                                          Cognition Arousal/Alertness: Awake/alert Behavior During Therapy: Flat affect Overall Cognitive Status: Difficult to assess                                 General Comments: Pt agreeable to participate in session with encouragement, daughter Brittany Pineda states that pt plans to stay with her upon DC. Pt states she feels stronger this afternoon, she defers DC planning decisions to her daughter.        Exercises Other Exercises Other Exercises: Seated BLE AROM: ankle pumps, LAQ, hip flexion x10 reps ea    General Comments General comments (skin integrity, edema, etc.): BP 113/64 (77) supine and 99/58 (72) standing after ~6 mins. HR 70's-80's bpm with exertion. TED hose donned throughout.      Pertinent Vitals/Pain Pain Assessment Pain Assessment: 0-10 Pain Score: 0-No pain Pain Location: bil feet Pain Descriptors / Indicators: Discomfort Pain Intervention(s): Monitored during session, Repositioned     PT Goals (current goals can now be found in the care plan section) Acute Rehab PT Goals Patient Stated Goal: To decrease pain and get stronger PT Goal Formulation: With patient Time For Goal Achievement: 06/28/22 Progress towards PT goals: Progressing toward goals    Frequency    Min 3X/week      PT Plan Discharge plan needs to be updated       AM-PAC PT "6 Clicks" Mobility   Outcome Measure  Help needed turning from your back to your side while in a flat bed without using bedrails?: None Help needed moving from lying on your back to sitting on the side of a flat bed without using bedrails?: None Help needed moving to and from a bed to a chair (including a wheelchair)?: A Little Help needed standing up from a chair using your arms (e.g., wheelchair or bedside chair)?: A Little Help needed to walk in hospital room?: A Little Help needed climbing 3-5 steps with a railing?  : Total 6 Click Score: 18    End of Session Equipment Utilized During Treatment: Gait belt Activity Tolerance: Other (comment);Patient tolerated treatment well;Patient limited by fatigue (fatigue improved from AM but still increased from her baseline) Patient left: in chair;with call bell/phone within reach;with family/visitor present;Other (comment) (dtr Brittany Pineda present so left chair alarm off, pt agreeable to use call bell prior to OOB) Nurse Communication: Mobility status;Other (comment) (pt OK to DC home with HH vs OP PT services if available, daughter will take her to her home if pt medically cleared.) PT Visit Diagnosis: Muscle weakness (generalized) (M62.81);Difficulty in walking, not elsewhere classified (R26.2);Pain Pain - part of body: Leg;Ankle and joints of foot     Time: 1610-9604 PT Time Calculation (min) (ACUTE ONLY): 21 min  Charges:  $Gait Training: 8-22 mins                     Brittany Pixley P., PTA Acute Rehabilitation Services Secure Chat Preferred 9a-5:30pm Office: [phone]    Dorathy Kinsman [facility] 06/18/2022, 5:21 PM

## 2022-06-18 NOTE — Progress Notes (Signed)
Daily Progress Note Intern Pager: [phone]  Patient name: Brittany Pineda Medical record number: [id number] Date of birth: 1955/08/28 Age: 67 y.o. Gender: female  Primary Care Provider: Shelby Mattocks, DO Consultants: GI Code Status: FULL2  Pt Overview and Major Events to Date:  5/4: Admitted  5/8: EGD, colonoscopy  Assessment and Plan: Brittany Pineda is a 67 y.o. female p/f myriad of symptoms with weight loss, fatigue, tarry stools, and reduced appetite concerning for malignancy or possibly PMR. EGD and colonoscopy today.    Encounter completed using [employer] phone interpreting services in Spanish. Interpreter: Charlsie Quest [id number]  Weakness Able to stand and walk without assistance. PT noted orthostatic vitals, continue to hold BP meds and encourage PO intake. Does appear steroid course has improved her strength, PMR possible.Likely multifactorial: Anemia (Iron deficiency and B12 deficiency), Hypothyroidism, PMR and acute illness. With Leukocytosis and thrombocytosis (not responded to abx), and weight loss there is still concern for malignancy. Will follow-up with Heme/Onc (see above). -Continue prednisone 20 mg daily -OP Heme   Essential hypertension Pressures remain soft, orthostatic with PT today. At home takes amlodipine 10 mg and losartan 25 mg. - Continue to hold home meds  Hypothyroidism -Continue synthroid 88 mcg -Repeat TSH 6-8 weeks outpatient  Controlled type 2 diabetes mellitus without complication, without long-term current use of insulin (HCC) Continue current regimen. Will start Glipizide 5 mg at D/C in setting of elevated glucose with steroid. - CBGs - Semglee 6U daily - SSI - Restart home metformin at dispo  Hyperlipidemia Start Crestor 20 mg daily.  Depressed mood Endorses sadness, LOI, poor sleep. Worsening over several years. Would like to see therapy and is interested in medical treatment. -TOC consult for  resources -Sertraline 50 mg daily  B12 deficiency Continue every other day B12 injection while admitted (TH,Sat). Switch to once weekly B12 injection for 2 months, then once monthly at D/C  Thrombocytosis [phone]. Unclear if acute phase reactant. Heme will follow-up in approximately 1 week outpatient-pending appointment. - Follow-up heme outpatient  Anemia Stable 9.1>9.4>9.9. Primarily iron deficiency and B12 deficiency. No acute bleed on EGD/Colonoscopy. Continue PPI for gastritis.  -F/u biopsies OP -Continue PO protonix   FEN/GI: Regular PPx: Lovenox Dispo: Home pending PT and OT recs today.    Subjective:  Reports feeling improved. No acute concerns. Spent 30 minutes with interpreter detailing plan for discharge and follow-up.  Objective: Temp:  [97.8 F (36.6 C)-98.5 F (36.9 C)] 98.2 F (36.8 C) (05/09 1034) Pulse Rate:  [64-75] 75 (05/09 1034) Resp:  [14-16] 16 (05/09 1034) BP: (94-107)/(52-61) 107/54 (05/09 1034) SpO2:  [95 %-100 %] 100 % (05/09 1034) Physical Exam: General: NAD, resting in bed, ambulating Cardiovascular: RRR, no mrg Respiratory: CTAB, normal wob on RA Abdomen: Soft, not tender, not distended Extremities: Moving all 4 extremities, no edema  Laboratory: Most recent CBC Lab Results  Component Value Date   WBC 21.0 (H) 06/18/2022   HGB 9.9 (L) 06/18/2022   HCT 31.0 (L) 06/18/2022   MCV 98.1 06/18/2022   PLT 944 (HH) 06/18/2022   Most recent BMP    Latest Ref Rng & Units 06/18/2022    2:09 AM  BMP  Glucose 70 - 99 mg/dL 474   BUN 8 - 23 mg/dL 10   Creatinine 2.59 - 1.00 mg/dL 5.63   Sodium 875 - 643 mmol/L 135   Potassium 3.5 - 5.1 mmol/L 3.5   Chloride 98 - 111 mmol/L 109   CO2 22 -  32 mmol/L 19   Calcium 8.9 - 10.3 mg/dL 7.5     Tiffany Kocher, DO 06/18/2022, 12:52 PM  PGY-1, [facility] Family [facility] FPTS Intern pager: [phone], text pages welcome Secure chat group [facility] Teaching Service

## 2022-06-19 ENCOUNTER — Telehealth: Payer: Self-pay | Admitting: Hematology

## 2022-06-19 ENCOUNTER — Other Ambulatory Visit (HOSPITAL_COMMUNITY): Payer: Self-pay

## 2022-06-19 LAB — O&P RESULT

## 2022-06-19 LAB — SURGICAL PATHOLOGY

## 2022-06-19 LAB — OVA + PARASITE EXAM

## 2022-06-19 LAB — CBC
HCT: 26 % — ABNORMAL LOW (ref 36.0–46.0)
Hemoglobin: 8.9 g/dL — ABNORMAL LOW (ref 12.0–15.0)
MCH: 32 pg (ref 26.0–34.0)
MCHC: 34.2 g/dL (ref 30.0–36.0)
MCV: 93.5 fL (ref 80.0–100.0)
Platelets: 969 10*3/uL (ref 150–400)
RBC: 2.78 MIL/uL — ABNORMAL LOW (ref 3.87–5.11)
RDW: 12.1 % (ref 11.5–15.5)
WBC: 19.8 10*3/uL — ABNORMAL HIGH (ref 4.0–10.5)
nRBC: 0 % (ref 0.0–0.2)

## 2022-06-19 LAB — GLUCOSE, CAPILLARY
Glucose-Capillary: 237 mg/dL — ABNORMAL HIGH (ref 70–99)
Glucose-Capillary: 157 mg/dL — ABNORMAL HIGH (ref 70–99)

## 2022-06-19 LAB — CULTURE, BLOOD (ROUTINE X 2)

## 2022-06-19 MED ORDER — SERTRALINE HCL 50 MG PO TABS
50.0000 mg | ORAL_TABLET | Freq: Every day | ORAL | 0 refills | Status: DC
  Filled 2022-06-19: qty 30, 30d supply, fill #0

## 2022-06-19 MED ORDER — LEVOTHYROXINE SODIUM 88 MCG PO TABS
88.0000 ug | ORAL_TABLET | Freq: Every day | ORAL | 0 refills | Status: DC
  Filled 2022-06-19: qty 30, 30d supply, fill #0

## 2022-06-19 MED ORDER — ROSUVASTATIN CALCIUM 20 MG PO TABS
20.0000 mg | ORAL_TABLET | Freq: Every day | ORAL | 0 refills | Status: DC
  Filled 2022-06-19: qty 30, 30d supply, fill #0

## 2022-06-19 MED ORDER — METFORMIN HCL 1000 MG PO TABS
1000.0000 mg | ORAL_TABLET | Freq: Two times a day (BID) | ORAL | 0 refills | Status: DC
Start: 2022-06-19 — End: 2022-08-24
  Filled 2022-06-19: qty 60, 30d supply, fill #0

## 2022-06-19 MED ORDER — PREDNISONE 20 MG PO TABS
20.0000 mg | ORAL_TABLET | Freq: Every day | ORAL | 0 refills | Status: DC
  Filled 2022-06-19: qty 30, 30d supply, fill #0

## 2022-06-19 MED ORDER — ROSUVASTATIN CALCIUM 20 MG PO TABS
20.0000 mg | ORAL_TABLET | Freq: Every day | ORAL | 11 refills | Status: DC
  Filled 2022-06-19: qty 30, 30d supply, fill #0

## 2022-06-19 MED ORDER — PANTOPRAZOLE SODIUM 40 MG PO TBEC
40.0000 mg | DELAYED_RELEASE_TABLET | Freq: Every day | ORAL | 0 refills | Status: DC
  Filled 2022-06-19: qty 30, 30d supply, fill #0

## 2022-06-19 MED ORDER — FERROUS GLUCONATE 324 (38 FE) MG PO TABS
324.0000 mg | ORAL_TABLET | Freq: Every day | ORAL | 0 refills | Status: DC
  Filled 2022-06-19: qty 30, 30d supply, fill #0

## 2022-06-19 NOTE — Progress Notes (Signed)
Daily Progress Note Intern Pager: [phone]  Patient name: Brittany Pineda Medical record number: [id number] Date of birth: 08/13/1955 Age: 67 y.o. Gender: female  Primary Care Provider: Shelby Mattocks, DO Consultants: GI (s/o) Code Status: FULL  Pt Overview and Major Events to Date:  5/4: Admitted  5/8: EGD, colonoscopy  Assessment and Plan: Brittany Pineda is a 67 y.o. female p/f myriad of symptoms with weight loss, fatigue, tarry stools, and reduced appetite concerning for malignancy or possibly PMR. EGD and colonoscopy today.    Encounter completed using [employer] video interpreting services in Spanish. Interpreter: Shanda Bumps [id number].  Plan for DC later today pending DME. Patient has daughter who will help her at home.  Weakness Patient sat up with ease, much more talkative and engaged this AM. Reports improvement since yesterday. Non-symptomatic orthostatic vitals with ted hose- continue. Does appear steroid course has improved her strength, PMR possible. Likely multifactorial: Anemia (Iron deficiency and B12 deficiency), Hypothyroidism, PMR and acute illness. With Leukocytosis and thrombocytosis (not responded to abx), and weight loss there is still concern for malignancy. Will follow-up with Heme/Onc (see above). -Continue prednisone 20 mg daily -OP Heme   Essential hypertension Still soft BP. At home takes amlodipine 10 mg and losartan 25 mg. - Continue to hold home meds  Hypothyroidism -Continue synthroid 88 mcg -Repeat TSH 6-8 weeks outpatient  Controlled type 2 diabetes mellitus without complication, without long-term current use of insulin (HCC) Continue current regimen. Will start Glipizide 5 mg at D/C in setting of elevated glucose with steroid. - CBGs - Semglee 6U daily - SSI - Restart home metformin at dispo  Hyperlipidemia Crestor 20 mg daily. -Repeat lipid panel ~2 months  Depressed mood -Sertraline 50 mg daily  B12  deficiency Switch to once weekly B12 injection for 2 months, then once monthly at D/C  Thrombocytosis 562>130>865. Unclear if acute phase reactant. Heme will follow-up in approximately 1 week outpatient-pending appointment. - Follow-up heme outpatient  Anemia Stable 8.9. Primarily iron deficiency and B12 deficiency. No acute bleed on EGD/Colonoscopy. Continue PPI for gastritis.  -F/u biopsies OP -Continue PO protonix -Continue iron and B12 supplement   FEN/GI: Regular PPx: Lovenox Dispo: home with HH PT  Subjective:  Reports more improvement since yesterday. No acute concerns. Answered questions related to dispo and home medications.  Objective: Temp:  [98.2 F (36.8 C)-98.7 F (37.1 C)] 98.5 F (36.9 C) (05/10 0449) Pulse Rate:  [70-75] 70 (05/10 0449) Resp:  [10-18] 11 (05/10 0449) BP: (102-107)/(54-59) 102/58 (05/10 0449) SpO2:  [95 %-100 %] 96 % (05/10 0449) Weight:  [60.8 kg] 60.8 kg (05/10 0449) Physical Exam: General: NAD, Sitting up in bed Cardiovascular: RRR, no murmurs Respiratory: CTAB, normal WOB on RA Abdomen: Soft, not tender, not distended Extremities: Moving all 4 extremities, no edema  Laboratory: Most recent CBC Lab Results  Component Value Date   WBC 19.8 (H) 06/19/2022   HGB 8.9 (L) 06/19/2022   HCT 26.0 (L) 06/19/2022   MCV 93.5 06/19/2022   PLT 969 (HH) 06/19/2022   Most recent BMP    Latest Ref Rng & Units 06/18/2022    2:09 AM  BMP  Glucose 70 - 99 mg/dL 784   BUN 8 - 23 mg/dL 10   Creatinine 6.96 - 1.00 mg/dL 2.95   Sodium 284 - 132 mmol/L 135   Potassium 3.5 - 5.1 mmol/L 3.5   Chloride 98 - 111 mmol/L 109   CO2 22 - 32 mmol/L 19  Calcium 8.9 - 10.3 mg/dL 7.5    Tiffany Kocher, DO 06/19/2022, 9:12 AM  PGY-1, [facility] Family Medicine FPTS Intern pager: [phone], text pages welcome Secure chat group [facility] Teaching Service

## 2022-06-19 NOTE — TOC Transition Note (Addendum)
Transition of Care (TOC) - CM/SW Discharge Note Donn Pierini RN, BSN Transitions of Care Unit 4E- RN Case Manager See Treatment Team for direct phone #   Patient Details  Name: Brittany Pineda MRN: [id number] Date of Birth: Dec 01, 1955  Transition of Care [facility]) CM/SW Contact:  Darrold Span, RN Phone Number: 06/19/2022, 2:43 PM   Clinical Narrative:    Pt stable for transition home today w/ daughter/family.   CM made call this am for DME referral- RW and BSC needs- contact made with in house provider [employer] who assist with [facility] program for DME needs for patients with no insurance. - [employer] to deliver DME to room prior to discharge.   Pt also with HHPT recommendation- Call made to [facility] for [facility] and staffing coverage- referral pending.  If pt is not able to receive HH- then could go outpt for PT needs, and would need to f/u with her PCP for referral- this info has been placed on AVS.  Update- 1520- [employer] has approved pt for HHPT needs- they will contact her to schedule home visits  CM spoke with pt at bedside through in person Interpreter. Info on [facility] pending arrangements, potential need for outpt, as well as Medications coming from [facility] pharmacy all reviewed with pt. Pt also had questions about her hospital bill- info provided about [facility] patient assistance programs and printed info provided in spanish as well. Questions answered and no further TOC needs noted.  DME has been delivered to the room, awaiting meds from [facility] pharmacy  Daughter to transport home.     Final next level of care: Home w Home Health Services Barriers to Discharge: Inadequate or no insurance   Patient Goals and CMS Choice   Choice offered to / list presented to : NA (no insurance- [facility] referral made)  Discharge Placement                 Home w/ HH (if approved by [facility])        Discharge Plan and  Services Additional resources added to the After Visit Summary for   In-house Referral: Clinical Social Work Discharge Planning Services: Other - See comment Post Acute Care Choice: Home Health, Durable Medical Equipment          DME Arranged: Bedside commode, Walker rolling DME Agency: [employer] Date DME Agency Contacted: 06/19/22 Time DME Agency Contacted: 0930 Representative spoke with at DME Agency: Barbara Cower HH Arranged: PT HH Agency: [facility] Date [facility] Agency Contacted: 06/19/22 Time HH Agency Contacted: 1000 Representative spoke with at [facility] Agency: Ephriam Knuckles  Social Determinants of Health (SDOH) Interventions SDOH Screenings   Food Insecurity: No Food Insecurity (06/13/2022)  Housing: Medium Risk (06/13/2022)  Transportation Needs: No Transportation Needs (06/13/2022)  Utilities: Not At Risk (06/13/2022)  Depression (PHQ2-9): Medium Risk (06/08/2022)  Tobacco Use: Medium Risk (06/17/2022)     Readmission Risk Interventions    06/19/2022    2:43 PM 06/19/2022    1:50 PM  Readmission Risk Prevention Plan  Post Dischage Appt Complete Complete  Medication Screening  Complete  Transportation Screening  Complete

## 2022-06-19 NOTE — Telephone Encounter (Signed)
Scheduled appt per 5/8 secure caht, left msg with pt, also sent letter

## 2022-06-19 NOTE — Plan of Care (Signed)

## 2022-06-19 NOTE — Progress Notes (Signed)
Order to discharge pt home - [facility] and walker delivered to pts room.  TOC meds ordered - waiting on medications.  Pt waiting on family to arrive and transport home.

## 2022-06-19 NOTE — Plan of Care (Signed)

## 2022-06-19 NOTE — Discharge Summary (Signed)
Family Medicine Teaching [facility] Discharge Summary  Patient name: Brittany Pineda Medical record number: [id number] Date of birth: [birth year] Age: 67 y.o. Gender: female Date of Admission: 06/13/2022  Date of Discharge: 06/19/2022 Admitting Physician: Darral Dash, DO  Primary Care Provider: Shelby Mattocks, DO Consultants: GI  Indication for Hospitalization: Weakness, weight loss, c/f GI bleed  Brief Hospital Course:  Brittany Pineda is a 67 y.o female with history of hypertension, T2DM, hyperlipidemia,and hypothyroidism who was admitted for weakness and unintentional weight loss.  Her hospital course is outlined below.  WeaknessWeight loss Extensive workup including: CT abdomen/pelvis, CBC with differential, blood smear, TSH, CK, B12, folate, RPR, HIV, CMP, iron studies-unremarkable aside from slight B12 deficiency, anemia (see below) elevated TSH from baseline, leukocytosis + thrombocytosis see below.  At this time low concern for cancer without evidence on CT and blood smear.  Started B12 shots (see anemia below). Increased Synthroid to 225 mcg, recommend repeat studies outpatient.  Additionally patient endorsing depression and sertraline 50 mg was started. Due to proximal nature of muscle weakness and elevated ESR, PMR was considered and 20 mg prednisone started. Patient 60.8 kg at DC (likely scale discrepancy), admission weight of 56 kg. Patient had positive improvement with addition of prednisone, recommend continuing at DC and PCP follow-up to discuss continued efficacy. Plan for [facility] PT v OPPT depending on affordability.   Anemia Previous baseline 13, 2 years ago.  10.7 on admission, patient initially endorsed melena. Hemoglobin trended down to 8.9 and stabilized.  Iron studies showed elevated ferritin and low TIBC, likely in setting of infection (see UTI below). Serum iron low, likely iron deficiency. There was concern for GI bleed and GI consulted.  EGD and Colonoscopy performed, no active bleed found. Edema and inflammation in stomach, biopsy taken and results pending at time of D/C. May have component of B12 deficiency per labs. Heme/Onc thinks B12 may be playing a role in patient's overall weakness and recommend weekly B12 injections for 2 months, then monthly injections.   Leukocytosis thrombocytosis WBC of 20.4 on admission and trended up on day 2 despite antibiotics.  Primary suspect infectious source, blood smear negative for malignant cells.  Thrombocytosis 944 at time of D/C. Primarily suspect acute phase reactant from infection vs inflammation. Spoke with Heme/Onc on 06/16/2022, who raised concern for possible malignancy and recommend OP f/u in 1 week for additional testing (JAK 2 and possible marrow biopsy).   Urinary tract infection With leukocytosis and low-grade fever of 100.7 evening of admission, there was concern for infection.  Broad-spectrum antibiotics (cefepime/vancomycin) ordered, blood cultures symptomatic.  UA with evidence of UTI, unfortunately culture showed multiple species-but was not recollected due to starting antibiotics.  Transition to ceftriaxone on 06/15/2022. Compeleted 3 day IV abx course. Asymptomatic and adequately treated at time of D/C.  Other chronic conditions were medically managed with home medications and formulary alternatives as necessary (hypertension, hypothyroid, hyperlipidemia)  Follow-up recommendations Recommend obtaining weight to trend weight loss. Held BP medications in setting of orthostatic vitals and soft pressures. Reevaluate. Simvastatin Dc'd in favor of Crestor for high intensity statin (2ndary prevention) Started Sertraline for depressed mood. Recommend scheduling f/u in 3 weeks to assess efficacy. Increased Synthroid to 88 mcg daily, recommend repeat TSH in 6-8 weeks. Will need weekly B12 injections for B12 deficiency for 2 months. Then monthly. Ensure patient has f/u with Dr. Mosetta Putt  for Hematology to assess Leukocytosis (now complicated by prednisone use) and thrombocytosis. Follow-up GI biopsy.  Started on  Prednisone for PMR. Reassess for efficacy and improvement of weakness- recommend reduction to 10 mg if planning to continue. If Hgb is dropping, consider DC steroid. Assess patient's blood glucose level, inquire about hypoglycemia. Started back on glipizide for improved sugar control while on prednisone. Changed metformin to 1000 BID.  Discharge Diagnoses/Problem List:  Principal Problem:   Sepsis without acute organ dysfunction (HCC) Active Problems:   Weakness   Hyperlipidemia   Controlled type 2 diabetes mellitus without complication, without long-term current use of insulin (HCC)   Hypothyroidism   Essential hypertension   Melena   Anemia   Loss of weight   Decreased appetite   GI bleed   UTI (urinary tract infection)   Thrombocytosis   B12 deficiency   Depressed mood  Disposition: Home with [facility] PT  Discharge Condition: Stable  Discharge Exam:  General: NAD, Sitting up in bed Cardiovascular: RRR, no murmurs Respiratory: CTAB, normal WOB on RA Abdomen: Soft, not tender, not distended Extremities: Moving all 4 extremities, no edema  Significant Procedures: EGD, Colonoscopy  Significant Labs and Imaging:  Recent Labs  Lab 06/18/22 0209 06/19/22 0110  WBC 21.0* 19.8*  HGB 9.9* 8.9*  HCT 31.0* 26.0*  PLT 944* 969*   Recent Labs  Lab 06/18/22 0209  NA 135  K 3.5  CL 109  CO2 19*  GLUCOSE 147*  BUN 10  CREATININE 0.56  CALCIUM 7.5*   Discharge Medications:  Allergies as of 06/19/2022   No Known Allergies      Medication List     STOP taking these medications    amLODipine 10 MG tablet Commonly known as: NORVASC   losartan 25 MG tablet Commonly known as: COZAAR   simvastatin 40 MG tablet Commonly known as: ZOCOR       TAKE these medications    AgaMatrix Presto w/Device Kit Use as instructed for PrestoWaveSense  meter.  Dispense QS for testing up to thrice daily.  Dx E11.9   ferrous gluconate 324 MG tablet Commonly known as: FERGON Take 1 tablet (324 mg total) by mouth daily with breakfast. Start taking on: Jun 20, 2022   glucose blood test strip Use as instructed for PrestoWaveSense meter.  Dispense QS for testing up to thrice daily.  Dx E11.9   Lancets Misc Dispense quantity sufficient for thrice daily testing.   levothyroxine 88 MCG tablet Commonly known as: SYNTHROID Take 1 tablet (88 mcg total) by mouth daily before breakfast. What changed:  medication strength how much to take   metFORMIN 1000 MG tablet Commonly known as: GLUCOPHAGE Take 1 tablet (1,000 mg total) by mouth 2 (two) times daily with a meal. What changed:  medication strength See the new instructions.   pantoprazole 40 MG tablet Commonly known as: Protonix Take 1 tablet (40 mg total) by mouth daily.   predniSONE 20 MG tablet Commonly known as: DELTASONE Take 1 tablet (20 mg total) by mouth daily with breakfast.   ReliOn Short Pen Needles 31G X 8 MM Misc Generic drug: Insulin Pen Needle USE AS DIRECTED ONCE DAILY   rosuvastatin 20 MG tablet Commonly known as: Crestor Take 1 tablet (20 mg total) by mouth daily.   rosuvastatin 20 MG tablet Commonly known as: CRESTOR Take 1 tablet (20 mg total) by mouth daily. Start taking on: Jun 20, 2022   sertraline 50 MG tablet Commonly known as: ZOLOFT Take 1 tablet (50 mg total) by mouth daily.   Tylenol 325 MG tablet Generic drug: acetaminophen Take 325-650 mg  by mouth daily as needed for mild pain or headache.               Durable Medical Equipment  (From admission, onward)           Start     Ordered   06/19/22 0731  For home use only DME Walker rolling  Once       Question Answer Comment  Walker: With 5 Inch Wheels   Patient needs a walker to treat with the following condition Weakness      06/19/22 0730   06/19/22 0731  For home use only  DME Bedside commode  Once       Question:  Patient needs a bedside commode to treat with the following condition  Answer:  Weakness   06/19/22 0730            Discharge Instructions: Please refer to Patient Instructions section of EMR for full details.  Patient was counseled important signs and symptoms that should prompt return to medical care, changes in medications, dietary instructions, activity restrictions, and follow up appointments.   Follow-Up Appointments:  Follow-up Information     Shelby Mattocks, DO. Go on 06/23/2022.   Specialty: Family Medicine Contact information: [address] [place] [address] [phone]                 Tiffany Kocher, DO 06/19/2022, 11:15 AM PGY-1, [facility]

## 2022-06-19 NOTE — Progress Notes (Signed)
Pt requesting for AVS to be reviewed when family arrives.  Family to arrive at 3:30 pm for transport home.

## 2022-06-19 NOTE — Progress Notes (Signed)
Order to discharge pt home.  Discharge instructions/AVS given to patient (and daughter) and reviewed - education provided as needed.  Pt advised to call PCP and/or come back to the hospital if there are any problems. Pt verbalized understanding.  Translator used during discharge.

## 2022-06-19 NOTE — Progress Notes (Signed)
Physical Therapy Treatment Patient Details Name: Brittany Pineda MRN: [id number] DOB: [birth year] Today's Date: 06/19/2022   History of Present Illness 67 y.o. female presenting to the ED 5/4 with report of 2 weeks of fatigue, unintentional weight loss, poor p.o. intake, black tarry stools. Pt admitted for further testing. PMH: HTN, DMII, hypothyroidism.    PT Comments    Pt received in supine, agreeable to therapy session with encouragement and with good participation and improved tolerance for transfer, gait and stair training this date. Pt needing up to min guard assist for stair safety and cues for UE placement and activity pacing during gait trial. Pt reports modified RPE 5/10 at end of session. Pt continues to benefit from PT services to progress toward functional mobility goals.    Recommendations for follow up therapy are one component of a multi-disciplinary discharge planning process, led by the attending physician.  Recommendations may be updated based on patient status, additional functional criteria and insurance authorization.  Follow Up Recommendations  Can patient physically be transported by private vehicle: Yes    Assistance Recommended at Discharge Intermittent Supervision/Assistance  Patient can return home with the following A little help with bathing/dressing/bathroom;Assistance with cooking/housework;Assist for transportation;Help with stairs or ramp for entrance;A little help with walking and/or transfers   Equipment Recommendations  Rolling walker (2 wheels);BSC/3in1;Other (comment) (orthostatic, not consistently able to walk to bathroom if symptomatic)    Recommendations for Other Services       Precautions / Restrictions Precautions Precautions: Fall Precaution Comments: orthostatic hypotension, watch BP Required Braces or Orthoses:  (TED hose for OOB mobility) Restrictions Weight Bearing Restrictions: No     Mobility  Bed Mobility Overal  bed mobility: Needs Assistance Bed Mobility: Supine to Sit     Supine to sit: Modified independent (Device/Increase time)     General bed mobility comments: increased time and use of bed features    Transfers Overall transfer level: Needs assistance Equipment used: Rolling walker (2 wheels) Transfers: Sit to/from Stand Sit to Stand: Supervision           General transfer comment: cues for safe hand placement prior to sitting/standing, pt with poor eccentric control to sit and ignores cues. Pt given additional visual/verbal demo at end of session to reinforce and pt agreeable to try reaching back more.    Ambulation/Gait Ambulation/Gait assistance: Supervision Gait Distance (Feet): 250 Feet Assistive device: Rolling walker (2 wheels) Gait Pattern/deviations: Step-through pattern, Decreased stride length Gait velocity: decr     General Gait Details: Slow/cautious gait pattern. Pt very reliant on RW due to c/o fatigue. HR WFL throughout. 5/10 modified RPE during and at end of trial.   Stairs Stairs: Yes Stairs assistance: Min guard Stair Management: One rail Left, Alternating pattern, Forwards, One rail Right, Step to pattern Number of Stairs: 5 General stair comments: HHA on side opposite rail, pt cued for step-to pattern due to recent fatigue but pt performs with reciprocal pattern ascending despite cues. Descending, pt given additional cues and performed via step-to pattern.   Wheelchair Mobility    Modified Rankin (Stroke Patients Only)       Balance Overall balance assessment: Mild deficits observed, not formally tested                                          Cognition Arousal/Alertness: Awake/alert Behavior During Therapy: Flat affect Overall Cognitive Status:  Difficult to assess                                 General Comments: Pt agreeable to participate in session with encouragement, daughter Vernona Rieger states that pt plans  to stay with her upon DC. Pt states she feels stronger this afternoon, she defers DC planning decisions to her daughter.        Exercises Other Exercises Other Exercises: HEP handout in Spanish in room from previous session, encouraged her to continue this TID as able, until HHPT begins    General Comments General comments (skin integrity, edema, etc.): SpO2/HR [facility]      Pertinent Vitals/Pain Pain Assessment Pain Assessment: No/denies pain Pain Intervention(s): Monitored during session, Repositioned           PT Goals (current goals can now be found in the care plan section) Acute Rehab PT Goals Patient Stated Goal: To decrease pain and get stronger PT Goal Formulation: With patient Time For Goal Achievement: 06/28/22 Progress towards PT goals: Progressing toward goals    Frequency    Min 3X/week      PT Plan Current plan remains appropriate       AM-PAC PT "6 Clicks" Mobility   Outcome Measure  Help needed turning from your back to your side while in a flat bed without using bedrails?: None Help needed moving from lying on your back to sitting on the side of a flat bed without using bedrails?: None Help needed moving to and from a bed to a chair (including a wheelchair)?: A Little Help needed standing up from a chair using your arms (e.g., wheelchair or bedside chair)?: A Little Help needed to walk in hospital room?: A Little Help needed climbing 3-5 steps with a railing? : A Little 6 Click Score: 20    End of Session Equipment Utilized During Treatment: Gait belt Activity Tolerance: Other (comment);Patient tolerated treatment well;Patient limited by fatigue (fatigue improved from AM but still increased from her baseline) Patient left: in chair;with call bell/phone within reach;with chair alarm set Nurse Communication: Mobility status;Other (comment) (pt OK to DC home with HHPT, case mgr called to her room to go over financial aid questions she has while  translator Raquel still present to assist.) PT Visit Diagnosis: Muscle weakness (generalized) (M62.81);Difficulty in walking, not elsewhere classified (R26.2);Pain Pain - part of body: Leg;Ankle and joints of foot     Time: 1610-9604 PT Time Calculation (min) (ACUTE ONLY): 21 min  Charges:  $Gait Training: 8-22 mins                     Maricela Kawahara P., PTA Acute Rehabilitation Services Secure Chat Preferred 9a-5:30pm Office: [phone]    Dorathy Kinsman [facility] 06/19/2022, 5:28 PM

## 2022-06-21 ENCOUNTER — Encounter (HOSPITAL_COMMUNITY): Payer: Self-pay | Admitting: Internal Medicine

## 2022-06-23 ENCOUNTER — Encounter: Payer: Self-pay | Admitting: Student

## 2022-06-23 ENCOUNTER — Other Ambulatory Visit: Payer: Self-pay

## 2022-06-23 ENCOUNTER — Ambulatory Visit (INDEPENDENT_AMBULATORY_CARE_PROVIDER_SITE_OTHER): Payer: Self-pay | Admitting: Student

## 2022-06-23 VITALS — BP 118/62 | HR 76 | Ht 63.0 in | Wt 125.0 lb

## 2022-06-23 DIAGNOSIS — I10 Essential (primary) hypertension: Secondary | ICD-10-CM

## 2022-06-23 DIAGNOSIS — R4589 Other symptoms and signs involving emotional state: Secondary | ICD-10-CM

## 2022-06-23 DIAGNOSIS — D75839 Thrombocytosis, unspecified: Secondary | ICD-10-CM

## 2022-06-23 DIAGNOSIS — E538 Deficiency of other specified B group vitamins: Secondary | ICD-10-CM

## 2022-06-23 DIAGNOSIS — E119 Type 2 diabetes mellitus without complications: Secondary | ICD-10-CM

## 2022-06-23 DIAGNOSIS — D649 Anemia, unspecified: Secondary | ICD-10-CM

## 2022-06-23 DIAGNOSIS — E039 Hypothyroidism, unspecified: Secondary | ICD-10-CM

## 2022-06-23 DIAGNOSIS — K2970 Gastritis, unspecified, without bleeding: Secondary | ICD-10-CM

## 2022-06-23 DIAGNOSIS — R5383 Other fatigue: Secondary | ICD-10-CM

## 2022-06-23 DIAGNOSIS — R531 Weakness: Secondary | ICD-10-CM

## 2022-06-23 DIAGNOSIS — B9681 Helicobacter pylori [H. pylori] as the cause of diseases classified elsewhere: Secondary | ICD-10-CM

## 2022-06-23 LAB — POCT HEMOGLOBIN: Hemoglobin: 10 g/dL — AB (ref 11–14.6)

## 2022-06-23 LAB — GLUCOSE, POCT (MANUAL RESULT ENTRY): POC Glucose: 226 mg/dl — AB (ref 70–99)

## 2022-06-23 MED ORDER — CYANOCOBALAMIN 1000 MCG/ML IJ SOLN
1000.0000 ug | INTRAMUSCULAR | Status: AC
Start: 2022-06-26 — End: 2022-07-16
  Administered 2022-07-03 – 2022-07-10 (×2): 1000 ug via INTRAMUSCULAR

## 2022-06-23 MED ORDER — BISMUTH 262 MG PO CHEW
524.0000 mg | CHEWABLE_TABLET | Freq: Four times a day (QID) | ORAL | 0 refills | Status: DC
  Filled 2022-06-23: qty 120, 15d supply, fill #0

## 2022-06-23 MED ORDER — PANTOPRAZOLE SODIUM 40 MG PO TBEC
40.0000 mg | DELAYED_RELEASE_TABLET | Freq: Two times a day (BID) | ORAL | 0 refills | Status: DC
  Filled 2022-06-23: qty 28, 14d supply, fill #0

## 2022-06-23 MED ORDER — METRONIDAZOLE 250 MG PO TABS
250.0000 mg | ORAL_TABLET | Freq: Four times a day (QID) | ORAL | 0 refills | Status: DC
  Filled 2022-06-23: qty 51, 13d supply, fill #0
  Filled 2022-06-23 (×2): qty 5, 1d supply, fill #0
  Filled 2022-06-23: qty 51, 13d supply, fill #0
  Filled 2022-06-23: qty 56, 14d supply, fill #0

## 2022-06-23 MED ORDER — AMOXICILLIN 500 MG PO CAPS
500.0000 mg | ORAL_CAPSULE | Freq: Four times a day (QID) | ORAL | 0 refills | Status: DC
  Filled 2022-06-23: qty 56, 14d supply, fill #0

## 2022-06-23 NOTE — Patient Instructions (Signed)
It was great to see you today! Thank you for choosing [facility] for your primary care. Brittany Pineda was seen for hospital follow-up.  Today we addressed: I am decreasing her steroid dosing to 10 mg daily.  You may take half a tablet of what you are taking now if you are able to separate those. You need weekly injections of B12..  To get these administered at our clinic. Please stop taking all of your blood pressure medications (amlodipine and losartan) You have been prescribed several medications by the GI doctor for H. pylori which is a bacteria that can cause vitamin deficiencies and stomach upset.  This was a result that recently came back from your stomach biopsy. Please continue to take your metformin twice a day.  If you haven't already, sign up for My Chart to have easy access to your labs results, and communication with your primary care physician.  Please arrive 15 minutes before your appointment to ensure smooth check in process.  We appreciate your efforts in making this happen.  Thank you for allowing me to participate in your care, Shelby Mattocks, DO 06/23/2022, 4:05 PM PGY-2, [facility]

## 2022-06-23 NOTE — Progress Notes (Signed)
SUBJECTIVE:   CHIEF COMPLAINT / HPI:   Hospital follow-up regarding weakness/weight loss, anemia, leukocytosis and thrombocytosis.  Follow-up recommendations are below.  Obtain weight trend weight loss Discussed BP medications as these were all held in the setting of orthostatic vitals and soft pressures.  She was originally on amlodipine 10 mg daily and losartan 25 mg daily. Reschedule follow-up in approximately 3 weeks to assess efficacy of initiation of sertraline for depressed mood. Repeat TSH in 6 to 8 weeks as Synthroid dosing was increased to 88 mcg daily. Is receiving B12 injections for deficiency weekly x 2 months.  Will then decrease to monthly. Ensure follow-up with Dr. Mosetta Putt for hematology regarding leukocytosis and thrombocytosis.  Of note this is scheduled on 07/11/2022 at 3:50 PM Follow-up GI biopsy.  O&P negative. Patient was started on H. Pylori regimen? Reassess for efficacy and improvement of weakness since initiation of prednisone for suspected PMR.  Recommended reduction to 10 mg if planning to continue.  If hemoglobin is dropping, consider discontinuing steroid. Assess blood glucose level regarding hypoglycemia.  Patient was initiated on glipizide for improved glucose control while on prednisone and changed to metformin 1000 mg twice daily. She stopped taking Glipizide because it dropped her sugars into the 70s.   Spanish video interpreter used throughout encounter.   PERTINENT  PMH / PSH: HTN, T2DM, HLD, hypothyroidism  Patient Care Team: Shelby Mattocks, DO as PCP - General (Family Medicine) Linna Darner, RD as Dietitian (Family Medicine) OBJECTIVE:  BP 118/62   Pulse 76   Ht 5\' 3"  (1.6 m)   Wt 125 lb (56.7 kg)   SpO2 96%   BMI 22.14 kg/m  Gen: well-appearing, NAD CV: RRR, no murmurs auscultated Pulm: CTAB, normal WOB  ASSESSMENT/PLAN:  Weakness Assessment & Plan: Continues to be present but improved on Prednisone. Thorough workup by inpatient team with  ddx of PMR, anemia (iron and B12 deficiency), hypothyroidism, malignancy (concern for weight loss still). She has appropriate follow up with GI and Heme/Onc and will have follow up frequently with myself. Weight is still down, she will have frequent follow up while these multifactorial reasons are addressed. - Reduce prednisone dosing to 10mg  daily  Orders: -     predniSONE; Take 0.5 tablets (10 mg total) by mouth daily with breakfast.  Dispense: 30 tablet; Refill: 0  Anemia, unspecified type Assessment & Plan: Stable Hgb 10. Likely multifactorial (iron and B12). Continue supplementation.   Orders: -     POCT hemoglobin  Helicobacter pylori gastritis Assessment & Plan: She has established follow up with GI on 07/28/22. GI biopsy showed evidence of H. Pylori and I have informed her of this and that she has been prescribed quadruple therapy and will need to pick up these medications at pharmacy.    B12 deficiency Assessment & Plan: Scheduled for RN follow up appointments for weekly B12 injections. She will follow up with me after her 4th dose and at that time we will consider transitioning to monthly injections or continue injections weekly for another month.   Orders: -     Cyanocobalamin  Controlled type 2 diabetes mellitus without complication, without long-term current use of insulin (HCC) Assessment & Plan: Poorly controlled secondary to prednisone. I have decreased prednisone to 10mg  daily which should help with her control. Continue metformin 1000mg  BID. NOT to restart Glipizide.   Orders: -     POCT glucose (manual entry)  Hypothyroidism, unspecified type Assessment & Plan: Will need follow up in late June  for repeat TSH. Continue Synthroid daily.   Depressed mood Assessment & Plan: Discuss at follow up regarding her Sertraline dose. PHQ-9 score of 7.   Thrombocytosis Assessment & Plan: She has established Heme/Onc visit on 07/17/22.   Essential  hypertension Assessment & Plan: Normal range without medications. Discontinue all BP meds at this time.    Shelby Mattocks, DO 06/24/2022, 10:04 AM PGY-2, [facility]

## 2022-06-24 DIAGNOSIS — K2970 Gastritis, unspecified, without bleeding: Secondary | ICD-10-CM | POA: Insufficient documentation

## 2022-06-24 MED ORDER — PREDNISONE 20 MG PO TABS: 10.0000 mg | ORAL_TABLET | Freq: Every day | ORAL | 0 refills | Status: DC

## 2022-06-24 NOTE — Assessment & Plan Note (Addendum)
She has established Heme/Onc visit on 07/17/22.

## 2022-06-24 NOTE — Assessment & Plan Note (Signed)
Will need follow up in late June for repeat TSH. Continue Synthroid daily.

## 2022-06-24 NOTE — Assessment & Plan Note (Addendum)
Continues to be present but improved on Prednisone. Thorough workup by inpatient team with ddx of PMR, anemia (iron and B12 deficiency), hypothyroidism, malignancy (concern for weight loss still). She has appropriate follow up with GI and Heme/Onc and will have follow up frequently with myself. Weight is still down, she will have frequent follow up while these multifactorial reasons are addressed. - Reduce prednisone dosing to 10mg  daily

## 2022-06-24 NOTE — Assessment & Plan Note (Signed)
Normal range without medications. Discontinue all BP meds at this time.

## 2022-06-24 NOTE — Assessment & Plan Note (Addendum)
Scheduled for RN follow up appointments for weekly B12 injections. She will follow up with me after her 4th dose and at that time we will consider transitioning to monthly injections or continue injections weekly for another month.

## 2022-06-24 NOTE — Assessment & Plan Note (Addendum)
Stable Hgb 10. Likely multifactorial (iron and B12). Continue supplementation.

## 2022-06-24 NOTE — Assessment & Plan Note (Signed)
Poorly controlled secondary to prednisone. I have decreased prednisone to 10mg  daily which should help with her control. Continue metformin 1000mg  BID. NOT to restart Glipizide.

## 2022-06-24 NOTE — Assessment & Plan Note (Addendum)
She has established follow up with GI on 07/28/22. GI biopsy showed evidence of H. Pylori and I have informed her of this and that she has been prescribed quadruple therapy and will need to pick up these medications at pharmacy.

## 2022-06-24 NOTE — Assessment & Plan Note (Signed)
Discuss at follow up regarding her Sertraline dose. PHQ-9 score of 7.

## 2022-06-26 ENCOUNTER — Ambulatory Visit: Payer: Self-pay

## 2022-07-03 ENCOUNTER — Ambulatory Visit (INDEPENDENT_AMBULATORY_CARE_PROVIDER_SITE_OTHER): Payer: Self-pay

## 2022-07-03 DIAGNOSIS — E538 Deficiency of other specified B group vitamins: Secondary | ICD-10-CM

## 2022-07-03 NOTE — Progress Notes (Signed)
Patient presents to nurse clinic with daughter for weekly B12 injection. Patient missed last week's dose. Administered in LD, site unremarkable, tolerated injection well.   Patient has follow up visit scheduled on 07/14/22 with PCP.   Will forward to PCP to determine if patient should keep this appointment or reschedule and have additional nursing visit prior to follow up with provider.   Veronda Prude, RN

## 2022-07-10 ENCOUNTER — Ambulatory Visit (INDEPENDENT_AMBULATORY_CARE_PROVIDER_SITE_OTHER): Payer: Self-pay

## 2022-07-10 DIAGNOSIS — E538 Deficiency of other specified B group vitamins: Secondary | ICD-10-CM

## 2022-07-10 NOTE — Progress Notes (Signed)
Pt is here for a b12 injection today.    Date of last office visit that b12 was discussed 06/23/2022.   Last injection was 07/03/2022  Injection given in RD, pt tolerated well. Per Dr. Delaney Meigs orders, patient is to receive weekly injections x4 and follow up with him. This was patient's third injection. Patient has follow up scheduled on 6/10 with Dr. Royal Piedra for follow up and to receive injection #4.   Veronda Prude, RN

## 2022-07-14 ENCOUNTER — Ambulatory Visit: Payer: Self-pay | Admitting: Student

## 2022-07-14 ENCOUNTER — Telehealth: Payer: Self-pay

## 2022-07-14 NOTE — Telephone Encounter (Signed)
Received VM from [place] at [facility] regarding patient. She states that patient has still been feeling cold, fatigue and having intermittent dizziness with position changes.   She also reports orthostatic changes with BP when going from sitting to standing. She did not leave exact measurements on VM.   Attempted to return call to RN. Unable to reach directly, LVM asking for returned call.   Also attempted to call patient with interpreter. She did not answer, unable to LVM.   Patient has upcoming visit on 07/20/22 with PCP.   Will attempt to call patient at later time.   Veronda Prude, RN

## 2022-07-15 NOTE — Telephone Encounter (Signed)
Received return call from [place]. She states that BP yesterday was 118/58- sitting, 88/50, standing.   She reports that patient has been drinking approx 75 oz of water per day.   Attempted to call patient again with spanish interpreter. I was unable to reach patient.   Will try again at later time.   Veronda Prude, RN

## 2022-07-17 ENCOUNTER — Other Ambulatory Visit: Payer: Self-pay

## 2022-07-17 ENCOUNTER — Inpatient Hospital Stay: Payer: Self-pay | Attending: Hematology

## 2022-07-17 ENCOUNTER — Inpatient Hospital Stay: Payer: Self-pay

## 2022-07-17 ENCOUNTER — Inpatient Hospital Stay (HOSPITAL_BASED_OUTPATIENT_CLINIC_OR_DEPARTMENT_OTHER): Payer: Self-pay | Admitting: Hematology

## 2022-07-17 ENCOUNTER — Encounter: Payer: Self-pay | Admitting: Hematology

## 2022-07-17 VITALS — BP 114/55 | HR 85 | Temp 98.2°F | Resp 14 | Ht 63.0 in | Wt 114.8 lb

## 2022-07-17 DIAGNOSIS — Z811 Family history of alcohol abuse and dependence: Secondary | ICD-10-CM | POA: Insufficient documentation

## 2022-07-17 DIAGNOSIS — Z87891 Personal history of nicotine dependence: Secondary | ICD-10-CM | POA: Insufficient documentation

## 2022-07-17 DIAGNOSIS — D75839 Thrombocytosis, unspecified: Secondary | ICD-10-CM

## 2022-07-17 DIAGNOSIS — D72829 Elevated white blood cell count, unspecified: Secondary | ICD-10-CM | POA: Insufficient documentation

## 2022-07-17 DIAGNOSIS — E039 Hypothyroidism, unspecified: Secondary | ICD-10-CM | POA: Insufficient documentation

## 2022-07-17 DIAGNOSIS — R6883 Chills (without fever): Secondary | ICD-10-CM

## 2022-07-17 DIAGNOSIS — Z79899 Other long term (current) drug therapy: Secondary | ICD-10-CM

## 2022-07-17 DIAGNOSIS — R634 Abnormal weight loss: Secondary | ICD-10-CM

## 2022-07-17 DIAGNOSIS — I10 Essential (primary) hypertension: Secondary | ICD-10-CM | POA: Insufficient documentation

## 2022-07-17 DIAGNOSIS — D649 Anemia, unspecified: Secondary | ICD-10-CM | POA: Insufficient documentation

## 2022-07-17 DIAGNOSIS — E119 Type 2 diabetes mellitus without complications: Secondary | ICD-10-CM

## 2022-07-17 DIAGNOSIS — Z90710 Acquired absence of both cervix and uterus: Secondary | ICD-10-CM

## 2022-07-17 DIAGNOSIS — Z9049 Acquired absence of other specified parts of digestive tract: Secondary | ICD-10-CM

## 2022-07-17 DIAGNOSIS — E785 Hyperlipidemia, unspecified: Secondary | ICD-10-CM

## 2022-07-17 DIAGNOSIS — Z8349 Family history of other endocrine, nutritional and metabolic diseases: Secondary | ICD-10-CM | POA: Insufficient documentation

## 2022-07-17 DIAGNOSIS — E538 Deficiency of other specified B group vitamins: Secondary | ICD-10-CM | POA: Insufficient documentation

## 2022-07-17 DIAGNOSIS — Z818 Family history of other mental and behavioral disorders: Secondary | ICD-10-CM

## 2022-07-17 LAB — CBC WITH DIFFERENTIAL/PLATELET
Abs Immature Granulocytes: 0.04 10*3/uL (ref 0.00–0.07)
Basophils Absolute: 0.2 10*3/uL — ABNORMAL HIGH (ref 0.0–0.1)
Basophils Relative: 1 %
Eosinophils Absolute: 0.4 10*3/uL (ref 0.0–0.5)
Eosinophils Relative: 4 %
HCT: 31.1 % — ABNORMAL LOW (ref 36.0–46.0)
Hemoglobin: 10 g/dL — ABNORMAL LOW (ref 12.0–15.0)
Immature Granulocytes: 0 %
Lymphocytes Relative: 14 %
Lymphs Abs: 1.6 10*3/uL (ref 0.7–4.0)
MCH: 30.1 pg (ref 26.0–34.0)
MCHC: 32.2 g/dL (ref 30.0–36.0)
MCV: 93.7 fL (ref 80.0–100.0)
Monocytes Absolute: 0.7 10*3/uL (ref 0.1–1.0)
Monocytes Relative: 6 %
Neutro Abs: 8 10*3/uL — ABNORMAL HIGH (ref 1.7–7.7)
Neutrophils Relative %: 75 %
Platelets: 709 10*3/uL — ABNORMAL HIGH (ref 150–400)
RBC: 3.32 MIL/uL — ABNORMAL LOW (ref 3.87–5.11)
RDW: 13.4 % (ref 11.5–15.5)
WBC: 10.8 10*3/uL — ABNORMAL HIGH (ref 4.0–10.5)
nRBC: 0 % (ref 0.0–0.2)

## 2022-07-17 LAB — COMPREHENSIVE METABOLIC PANEL
ALT: 9 U/L (ref 0–44)
AST: 15 U/L (ref 15–41)
Albumin: 3.1 g/dL — ABNORMAL LOW (ref 3.5–5.0)
Alkaline Phosphatase: 53 U/L (ref 38–126)
Anion gap: 11 (ref 5–15)
BUN: 13 mg/dL (ref 8–23)
CO2: 26 mmol/L (ref 22–32)
Calcium: 9 mg/dL (ref 8.9–10.3)
Chloride: 96 mmol/L — ABNORMAL LOW (ref 98–111)
Creatinine, Ser: 0.82 mg/dL (ref 0.44–1.00)
GFR, Estimated: >60 mL/min (ref >60)
Glucose, Bld: 120 mg/dL — ABNORMAL HIGH (ref 70–99)
Potassium: 4.4 mmol/L (ref 3.5–5.1)
Sodium: 133 mmol/L — ABNORMAL LOW (ref 135–145)
Total Bilirubin: 0.5 mg/dL (ref 0.3–1.2)
Total Protein: 7.8 g/dL (ref 6.5–8.1)

## 2022-07-17 MED ORDER — CYANOCOBALAMIN 1000 MCG/ML IJ SOLN
1000.0000 ug | Freq: Once | INTRAMUSCULAR | Status: AC
  Administered 2022-07-17: 1000 ug via INTRAMUSCULAR
  Filled 2022-07-17: qty 1

## 2022-07-17 MED ORDER — CYANOCOBALAMIN 1000 MCG/ML IJ SOLN: 1000.0000 ug | Freq: Once | INTRAMUSCULAR | Status: DC

## 2022-07-17 NOTE — Progress Notes (Signed)
[facility]   [phone] [phone]   Clinic New Consult Note   Patient Care Team: Shelby Mattocks, DO as PCP - General (Family Medicine) Linna Darner, RD as Dietitian (Family Medicine) Malachy Mood, MD as Consulting Physician (Hematology and Oncology)  Date of Service:  07/17/2022   CHIEF COMPLAINTS/PURPOSE OF CONSULTATION:  Anemia, thrombocytosis and leukocytosis  REFERRING PHYSICIAN:  Shelby Mattocks, DO  HISTORY OF PRESENTING ILLNESS:  Brittany Pineda 67 y.o. female with past medical history of hypertension, type 2 diabetes, and hypothyroidism is a here because of abnormal blood counts. The patient was referred by Shelby Mattocks, DO. The patient presents to the clinic today accompanied by interpreter.  Patient presented with 30 pound weight loss in the past few months, with significant progressive generalized weakness.  He presented to emergency room on Jun 13, 2022 and was admitted for further workup.  CBC showed significant thrombocytosis with platelet up to 900K, mild leukocytosis with predominant neutrophil, and moderate anemia with hemoglobin 8-9.  CT abdomen pelvis was negative, anemia workup showed low B12, iron study showed low iron and TIBC, he started B12 injection in the hospital.  Due to the proximal nature of muscular weakness and elevated ESR, PMR was considered and she started prednisone in the hospital, however patient does not feel significant improvement.  Due to the hyperglycemia from steroid, she has stopped her prednisone.  Review her previous CBC showed a normal CBC in May 2020, she gradually developed a mild anemia, leukocytosis and thrombocytopenia in April 2024, which got worse in May 2024 when she was hospitalized.  Pt  state that she is feeling overall better now that she doesn't have the pain in her feet. Pt denies having any pain at this time.Pt state she has an appetite but she tries to watch what she eat cause of  her diabetes.    She has a PMHx of.... Diabetes Mellitus Hypertension Thyroid Disease Hyperlipidemia   Socially... Single 4 children   REVIEW OF SYSTEMS:   Constitutional: (-)Denies fevers, (+)chills or(-) abnormal night sweats Eyes: (-)Denies blurriness of vision, double vision or watery eyes Ears, nose, mouth, throat, and face:(-) Denies mucositis or sore throat Respiratory: (-)Denies cough, dyspnea or wheezes Cardiovascular:(-) Denies palpitation, chest discomfort or lower extremity swelling Gastrointestinal: (-) Denies nausea, heartburn or change in bowel habits Skin:(-) Denies abnormal skin rashes Lymphatics: (-)Denies new lymphadenopathy or easy bruising Neurological:(-)Denies numbness, tingling or new weaknesses Behavioral/Psych:(-) Mood is stable, no new changes    All other systems were reviewed with the patient and are negative.   MEDICAL HISTORY:  Past Medical History:  Diagnosis Date   Diabetes mellitus    Hyperlipidemia    Hypertension    PONV (postoperative nausea and vomiting)    Thyroid disease     SURGICAL HISTORY: Past Surgical History:  Procedure Laterality Date   ABDOMINAL HYSTERECTOMY     BIOPSY  06/17/2022   Procedure: BIOPSY;  Surgeon: Beverley Fiedler, MD;  Location: [facility] ENDOSCOPY;  Service: Gastroenterology;;   CHOLECYSTECTOMY     COLONOSCOPY WITH PROPOFOL N/A 06/17/2022   Procedure: COLONOSCOPY WITH PROPOFOL;  Surgeon: Beverley Fiedler, MD;  Location: [facility] ENDOSCOPY;  Service: Gastroenterology;  Laterality: N/A;   ESOPHAGOGASTRODUODENOSCOPY N/A 06/17/2022   Procedure: ESOPHAGOGASTRODUODENOSCOPY (EGD);  Surgeon: Beverley Fiedler, MD;  Location: [facility] ENDOSCOPY;  Service: Gastroenterology;  Laterality: N/A;    SOCIAL HISTORY: Social History   Socioeconomic History   Marital status: Single    Spouse name: Not  on file   Number of children: 4   Years of education: Not on file   Highest education level: Not on file  Occupational History   Not on file   Tobacco Use   Smoking status: Former   Smokeless tobacco: Never   Tobacco comments:    Stopped in 1996  Vaping Use   Vaping Use: Never used  Substance and Sexual Activity   Alcohol use: No    Alcohol/week: 0.0 standard drinks of alcohol   Drug use: No   Sexual activity: Not on file  Other Topics Concern   Not on file  Social History Narrative   Lives with daughter currently and she relies on her daughter for most of her care and interpretation    Social Determinants of Health   Financial Resource Strain: Not on file  Food Insecurity: No Food Insecurity (06/13/2022)   Hunger Vital Sign    Worried About Running Out of Food in the Last Year: Never true    Ran Out of Food in the Last Year: Never true  Transportation Needs: No Transportation Needs (06/13/2022)   PRAPARE - Administrator, Civil Service (Medical): No    Lack of Transportation (Non-Medical): No  Physical Activity: Not on file  Stress: Not on file  Social Connections: Not on file  Intimate Partner Violence: Not At Risk (06/13/2022)   Humiliation, Afraid, Rape, and Kick questionnaire    Fear of Current or Ex-Partner: No    Emotionally Abused: No    Physically Abused: No    Sexually Abused: No    FAMILY HISTORY: Family History  Problem Relation Age of Onset   Alcohol abuse Father    Hyperlipidemia Sister    Depression Brother     ALLERGIES:  has No Known Allergies.  MEDICATIONS:  Current Outpatient Medications  Medication Sig Dispense Refill   amoxicillin (AMOXIL) 500 MG capsule Take 1 capsule (500 mg total) by mouth 4 (four) times daily. 56 capsule 0   Bismuth 262 MG CHEW Chew 2 tablets (524 mg) by mouth in the morning, at noon, in the evening, and at bedtime. 120 tablet 0   Blood Glucose Monitoring Suppl (AGAMATRIX PRESTO) w/Device KIT Use as instructed for PrestoWaveSense meter.  Dispense QS for testing up to thrice daily.  Dx E11.9 1 kit 0   ferrous gluconate (FERGON) 324 MG tablet Take 1 tablet  (324 mg total) by mouth daily with breakfast. 30 tablet 0   glucose blood test strip Use as instructed for PrestoWaveSense meter.  Dispense QS for testing up to thrice daily.  Dx E11.9 100 each 0   Lancets MISC Dispense quantity sufficient for thrice daily testing. 100 each 0   levothyroxine (SYNTHROID) 88 MCG tablet Take 1 tablet (88 mcg total) by mouth daily before breakfast. 30 tablet 0   metFORMIN (GLUCOPHAGE) 1000 MG tablet Take 1 tablet (1,000 mg total) by mouth 2 (two) times daily with a meal. 60 tablet 0   metroNIDAZOLE (FLAGYL) 250 MG tablet Take 1 tablet (250 mg total) by mouth 4 (four) times daily. 56 tablet 0   pantoprazole (PROTONIX) 40 MG tablet Take 1 tablet (40 mg total) by mouth 2 (two) times daily. 28 tablet 0   RELION SHORT PEN NEEDLES 31G X 8 MM MISC USE AS DIRECTED ONCE DAILY 50 each 0   rosuvastatin (CRESTOR) 20 MG tablet Take 1 tablet (20 mg total) by mouth daily. 30 tablet 11   sertraline (ZOLOFT) 50 MG tablet Take  1 tablet (50 mg total) by mouth daily. 30 tablet 0   TYLENOL 325 MG tablet Take 325-650 mg by mouth daily as needed for mild pain or headache.     Current Facility-Administered Medications  Medication Dose Route Frequency Provider Last Rate Last Admin   cyanocobalamin (VITAMIN B12) injection 1,000 mcg  1,000 mcg Intramuscular Once Malachy Mood, MD        PHYSICAL EXAMINATION: ECOG PERFORMANCE STATUS: 1 - Symptomatic but completely ambulatory  Vitals:   07/17/22 1600  BP: (!) 114/55  Pulse: 85  Resp: 14  Temp: 98.2 F (36.8 C)  SpO2: 100%   Filed Weights   07/17/22 1600  Weight: 114 lb 12.8 oz (52.1 kg)     GENERAL:alert, no distress and comfortable SKIN: skin color normal, no rashes or significant lesions EYES: normal, Conjunctiva are pink and non-injected, sclera clear  NEURO: alert & oriented x 3 with fluent speech NECK: (-) supple, thyroid normal size, non-tender, without nodularity LYMPH:  (-)no palpable lymphadenopathy in the cervical,  axillary  LUNGS:(-)  clear to auscultation and percussion with normal breathing effort HEART: (-) regular rate & rhythm and no murmurs and (-) no lower extremity edema ABDOMEN:(-) abdomen soft, (-) non-tender and (-) normal bowel sounds   LABORATORY DATA:  I have reviewed the data as listed    Latest Ref Rng & Units 07/17/2022    4:12 PM 06/23/2022    3:18 PM 06/19/2022    1:10 AM  CBC  WBC 4.0 - 10.5 K/uL 10.8   19.8   Hemoglobin 12.0 - 15.0 g/dL 16.1  09.6  8.9   Hematocrit 36.0 - 46.0 % 31.1   26.0   Platelets 150 - 400 K/uL 709   969        Latest Ref Rng & Units 07/17/2022    4:12 PM 06/18/2022    2:09 AM 06/17/2022    1:03 AM  CMP  Glucose 70 - 99 mg/dL 045  409  811   BUN 8 - 23 mg/dL 13  10  13    Creatinine 0.44 - 1.00 mg/dL 9.14  7.82  9.56   Sodium 135 - 145 mmol/L 133  135  136   Potassium 3.5 - 5.1 mmol/L 4.4  3.5  4.4   Chloride 98 - 111 mmol/L 96  109  105   CO2 22 - 32 mmol/L 26  19  18    Calcium 8.9 - 10.3 mg/dL 9.0  7.5  8.1   Total Protein 6.5 - 8.1 g/dL 7.8     Total Bilirubin 0.3 - 1.2 mg/dL 0.5     Alkaline Phos 38 - 126 U/L 53     AST 15 - 41 U/L 15     ALT 0 - 44 U/L 9        RADIOGRAPHIC STUDIES: I have personally reviewed the radiological images as listed and agreed with the findings in the report. No results found.  ASSESSMENT & PLAN:  Brittany Pineda is a 67 y.o.  female with a history of    Anemia and B12 deficiency -She developed mild anemia in July 2020, but it resolved in December 2021.  Lab showed progressive anemia since April 2024, anemia workup showed low B12 at 166, low serum iron and low TIBC, normal folate, ferritin was elevated at 430, this is consistent with B12 deficiency and anemia of chronic disease -She started B12 injection in the hospital, has been receiving weekly for the past 6 weeks.  Will continue weekly injection for total of 8 weeks, then changed to monthly injection. -Will check intrinsic factor to see if  she has pernicious anemia -If her anemia does not resolve after adequate B12 injection, will obtain a bone marrow biopsy, given the other abnormal CBC.  2.  Thrombocytosis and leukocytosis -Will rule out MPN, check MPN panel and BCR/ABL today -Could be reactive also.  Given the significant low B12, will repeat her B12 adequately -Will decide if she needs a bone marrow biopsy depending on her genetic testing results and her blood counts after adequate B12 replacement  3. Weight loss and generalized fatigue -Not sure if related to her abnormal CBC, certainly possible, patient can have constitutional symptoms including weight loss from MPN -I encouraged her to have high-protein diet and consider nutritional supplement  4.  Financial concerns -Patient has no insurance, is concerned about the cost of her medical care -Will refer her to Child psychotherapist and financial advocate   PLAN:  -lab today-repeat blood counts -reviewing previous lab with pt -suspension for MPN  - I recommend blood test and bone marrow biopsy -  B12 injection today and in a week -lab and f/u in 2 weeks, to decide if she needs BM biopsy   Orders Placed This Encounter  Procedures   CBC with Differential/Platelet    Standing Status:   Standing    Number of Occurrences:   50    Standing Expiration Date:   07/17/2023   Comprehensive metabolic panel    Standing Status:   Standing    Number of Occurrences:   50    Standing Expiration Date:   07/17/2023   JAK2 (INCLUDING V617F AND EXON 12), MPL,& CALR W/RFL MPN PANEL (NGS)    Standing Status:   Future    Number of Occurrences:   1    Standing Expiration Date:   07/17/2023   BCR ABL1 FISH ([facility])    Standing Status:   Future    Number of Occurrences:   1    Standing Expiration Date:   07/17/2023   SCHEDULING COMMUNICATION INJECTION    Schedule 1 hour injection appointment   SCHEDULING COMMUNICATION INJECTION    Schedule 1 hour injection appointment.    All questions  were answered. The patient knows to call the clinic with any problems, questions or concerns. The total time spent in the appointment was 45 minutes.     Malachy Mood, MD 07/17/2022 5:28 PM  I, Monica Martinez, am acting as scribe for Malachy Mood, MD.   I have reviewed the above documentation for accuracy and completeness, and I agree with the above.

## 2022-07-19 NOTE — Progress Notes (Unsigned)
  SUBJECTIVE:   CHIEF COMPLAINT / HPI:   B12 deficiency: Recently seen by oncology, advised weekly B12 injections for total 8 weeks rather than 4. Then followed by monthly. She notes the oncologist is doing blood work and she will get results in 2 weeks. And she will get a biopsy done soon but they will let her know.   H. Pylori: Follow up with GI on 07/28/22.   She feels better because she can walk on her feet and her feet don't hurt anymore. She still feels weak, but "I can feel a lot of things now". She can walk, wash dishes...she wasn't able to do these before. She thinks it may be the iron and vitamins she is getting. She has been able to keep up with her medications. Her mood is better as well.   PERTINENT  PMH / PSH: HTN, T2DM, HLD, hypothyroidism  Patient Care Team: Shelby Mattocks, DO as PCP - General (Family Medicine) Linna Darner, RD as Dietitian (Family Medicine) Malachy Mood, MD as Consulting Physician (Hematology and Oncology) OBJECTIVE:  BP (!) 97/57   Pulse 88   Wt 113 lb 9.6 oz (51.5 kg)   SpO2 100%   BMI 20.12 kg/m  Physical Exam Cardiovascular:     Rate and Rhythm: Normal rate and regular rhythm.     Heart sounds: Normal heart sounds.  Pulmonary:     Effort: Pulmonary effort is normal.     Breath sounds: Normal breath sounds.  Neurological:     General: No focal deficit present.     Mental Status: She is oriented to person, place, and time.  Psychiatric:        Mood and Affect: Mood normal.        Behavior: Behavior normal.     ASSESSMENT/PLAN:  Weakness Assessment & Plan: It seems that overall this problem is improving.  However, her weight loss has continued.  Appreciate oncology and GI assistance with this.  I have encouraged her to continue to eat 3 meals a day and she seems to be doing better now that her daughter-in-law is assisting at home.  Given her extensive workup during hospitalization, this seems more likely to be more factorial in origin.  I do  believe she has received great benefit from starting Zoloft as she is more conversational and with elevated mood than she has been in the past.   B12 deficiency Assessment & Plan: She received B12 3 days ago.  Oncology has adjusted treatment regimen and appears they have taken over for assisting her with this.  I appreciate their continued care.   Hypothyroidism, unspecified type Assessment & Plan: Recheck today.  TSH was elevated in the past.  Orders: -     TSH -     Levothyroxine Sodium; Take 1 tablet (88 mcg total) by mouth daily before breakfast.  Dispense: 30 tablet; Refill: 0  Anemia, unspecified type Assessment & Plan: I have reviewed oncology note, they may consider biopsy however currently BCR-ABL blood testing is in process.  Orders: -     Ferrous Gluconate; Take 1 tablet (324 mg total) by mouth daily with breakfast.  Dispense: 30 tablet; Refill: 0  Mixed hyperlipidemia -     Rosuvastatin Calcium; Take 1 tablet (20 mg total) by mouth daily.  Dispense: 30 tablet; Refill: 11  Return in about 1 month (around 08/19/2022) for follow up health maintenance. Shelby Mattocks, DO 07/20/2022, 4:03 PM PGY-2, [facility]

## 2022-07-20 ENCOUNTER — Ambulatory Visit (INDEPENDENT_AMBULATORY_CARE_PROVIDER_SITE_OTHER): Payer: Self-pay | Admitting: Student

## 2022-07-20 ENCOUNTER — Encounter: Payer: Self-pay | Admitting: Student

## 2022-07-20 VITALS — BP 97/57 | HR 88 | Wt 113.6 lb

## 2022-07-20 DIAGNOSIS — D649 Anemia, unspecified: Secondary | ICD-10-CM

## 2022-07-20 DIAGNOSIS — E538 Deficiency of other specified B group vitamins: Secondary | ICD-10-CM

## 2022-07-20 DIAGNOSIS — E782 Mixed hyperlipidemia: Secondary | ICD-10-CM

## 2022-07-20 DIAGNOSIS — E039 Hypothyroidism, unspecified: Secondary | ICD-10-CM

## 2022-07-20 DIAGNOSIS — R531 Weakness: Secondary | ICD-10-CM

## 2022-07-20 MED ORDER — ROSUVASTATIN CALCIUM 20 MG PO TABS
20.0000 mg | ORAL_TABLET | Freq: Every day | ORAL | 11 refills | Status: DC
Start: 2022-07-20 — End: 2023-06-03

## 2022-07-20 MED ORDER — LEVOTHYROXINE SODIUM 88 MCG PO TABS: 88.0000 ug | ORAL_TABLET | Freq: Every day | ORAL | 0 refills | Status: DC

## 2022-07-20 MED ORDER — FERROUS GLUCONATE 324 (38 FE) MG PO TABS
324.0000 mg | ORAL_TABLET | Freq: Every day | ORAL | 0 refills | Status: DC
Start: 2022-07-20 — End: 2022-08-24

## 2022-07-20 NOTE — Assessment & Plan Note (Signed)
She received B12 3 days ago.  Oncology has adjusted treatment regimen and appears they have taken over for assisting her with this.  I appreciate their continued care.

## 2022-07-20 NOTE — Patient Instructions (Addendum)
  Fue genial verte hoy! Karl Pock por elegir [facility] para su atencin primaria. Brittany Pineda fue atendida para seguimiento.  Hoy abordamos: 1. Me alegro mucho de que se sienta mejor y pueda ser ms [employer].  Contine concentrndose en comer 3 comidas al da.  Hoy estoy revisando su medicacin para la tiroides.  Tambin envi algunos medicamentos a su farmacia para que los [place].  Si an no lo ha hecho, regstrese en My Chart para tener fcil acceso a los resultados de sus laboratorios y Engineer, materials con su mdico de Marine scientist.  Estamos revisando algunos laboratorios hoy. Si son anormales, te Freight forwarder. Si son normales, Transport planner un mensaje de MyChart (si est Musician) o una carta por correo. Si no recibe informacin sobre sus laboratorios en las prximas 2 semanas, llame a la oficina.  Debe regresar a nuestra clnica. No hay seguimientos registrados. Llegue 15 minutos antes de su cita para garantizar un proceso de registro sin problemas.  Apreciamos sus esfuerzos para que esto suceda.   It was great to see you today! Thank you for choosing [facility] for your primary care. Brittany Pineda was seen for follow up.  Today we addressed: I am so glad you are feeling better and able to be more active.  Please continue to focus on eating 3 meals a day.  I am checking your thyroid medication today.  I have also sent in some medications to your pharmacy for refill.  If you haven't already, sign up for My Chart to have easy access to your labs results, and communication with your primary care physician.  We are checking some labs today. If they are abnormal, I will call you. If they are normal, I will send you a MyChart message (if it is active) or a letter in the mail. If you do not hear about your labs in the next 2 weeks, please call the office.  You should return to our clinic Return in about 1 month (around 08/19/2022) for follow up health  maintenance. Please arrive 15 minutes before your appointment to ensure smooth check in process.  We appreciate your efforts in making this happen.  Thank you for allowing me to participate in your care, Shelby Mattocks, DO 07/20/2022, 2:52 PM PGY-2, [facility]

## 2022-07-20 NOTE — Assessment & Plan Note (Signed)
It seems that overall this problem is improving.  However, her weight loss has continued.  Appreciate oncology and GI assistance with this.  I have encouraged her to continue to eat 3 meals a day and she seems to be doing better now that her daughter-in-law is assisting at home.  Given her extensive workup during hospitalization, this seems more likely to be more factorial in origin.  I do believe she has received great benefit from starting Zoloft as she is more conversational and with elevated mood than she has been in the past.

## 2022-07-20 NOTE — Assessment & Plan Note (Signed)
I have reviewed oncology note, they may consider biopsy however currently BCR-ABL blood testing is in process.

## 2022-07-20 NOTE — Assessment & Plan Note (Signed)
Recheck today.  TSH was elevated in the past.

## 2022-07-21 LAB — TSH: TSH: 1.1 u[IU]/mL (ref 0.450–4.500)

## 2022-07-22 ENCOUNTER — Encounter: Payer: Self-pay | Admitting: Student

## 2022-07-24 ENCOUNTER — Inpatient Hospital Stay: Payer: Self-pay

## 2022-07-24 ENCOUNTER — Other Ambulatory Visit: Payer: Self-pay

## 2022-07-24 DIAGNOSIS — E538 Deficiency of other specified B group vitamins: Secondary | ICD-10-CM

## 2022-07-24 MED ORDER — CYANOCOBALAMIN 1000 MCG/ML IJ SOLN
1000.0000 ug | Freq: Once | INTRAMUSCULAR | Status: AC
  Administered 2022-07-24: 1000 ug via INTRAMUSCULAR
  Filled 2022-07-24: qty 1

## 2022-07-28 ENCOUNTER — Encounter: Payer: Self-pay | Admitting: Gastroenterology

## 2022-07-28 ENCOUNTER — Ambulatory Visit: Payer: Self-pay | Admitting: Gastroenterology

## 2022-07-28 VITALS — BP 104/60 | HR 81 | Ht 61.0 in | Wt 115.0 lb

## 2022-07-28 DIAGNOSIS — D75839 Thrombocytosis, unspecified: Secondary | ICD-10-CM

## 2022-07-28 DIAGNOSIS — B9681 Helicobacter pylori [H. pylori] as the cause of diseases classified elsewhere: Secondary | ICD-10-CM

## 2022-07-28 DIAGNOSIS — K2970 Gastritis, unspecified, without bleeding: Secondary | ICD-10-CM

## 2022-07-28 DIAGNOSIS — E538 Deficiency of other specified B group vitamins: Secondary | ICD-10-CM

## 2022-07-28 DIAGNOSIS — D649 Anemia, unspecified: Secondary | ICD-10-CM

## 2022-07-28 DIAGNOSIS — R634 Abnormal weight loss: Secondary | ICD-10-CM

## 2022-07-28 NOTE — Progress Notes (Signed)
Chief Complaint:    Weight loss, hospital follow-up, H. pylori follow-up   HPI:     Patient is a 67 y.o. female with a history of HTN, diabetes, hypothyroidism, presenting to the Gastroenterology Clinic for hospital follow-up.   Hospital admission 5/5-10 with weakness, unintentional weight loss (30#), anemia, melena.  Diagnosed with UTI. - 06/13/2022: CT A/P: Unremarkable.  Previous cholecystectomy and hysterectomy - B12 deficiency (166).  Started on weekly B12 injection x 2 months - Hematology consulted for thrombocytosis and leukocytosis, all suspected 2/2 infection, but could not rule out malignant etiology.   - Was started on prednisone for weakness, elevated ESR, with concern for PMR.  This was stopped as an outpatient due to hyperglycemia - 06/17/2022: EGD: 1 cm HH, nonobstructing Schatzki's ring, moderate H. pylori gastritis.  Started on quadruple therapy - 06/17/2022: Colonoscopy: Normal.  Repeat in 10 years - 06/19/2022 (hospital discharge): H/H 8.9/26, PLT Niner 69, WBC 19.8   Had follow-up in the Hematology Clinic with Dr. Mosetta Putt on 07/17/2022.  Diagnosed with B12 deficiency and anemia of chronic disease, continued weekly B12 injection, with plan for BM biopsy if B12 anemia does not resolve.  Was also evaluating for MPN, again with consideration for BM biopsy depending on results and response to therapy, with plan for follow-up later this week.  Was seen in follow-up by PCM on 6/11.  Was overall feeling better.  Was started on ferrous gluconate, Synthroid, continued B12 injections.  Appetite has improved.  Weight slowly improving, but overall 25# below baseline.  She has completed H. pylori therapy as prescribed (amoxicillin, Flagyl, bismuth, PPI). No longer taking pantoprazole. No prior hx of GERD, HB, regurgitation.   Otherwise, no GI sxs and overall feeling better with improved energy. No abdominal pain, n/v/d/c.   -07/17/2022: H/H 10/31, plt 709, WBC 10.8 (baseline Hgb ~11 prior to  admission)   History obtained through Spanish interpretor in room today.   Review of systems:     No chest pain, no SOB, no fevers, no urinary sx   Past Medical History:  Diagnosis Date   Diabetes mellitus    History of Helicobacter pylori infection    Hyperlipidemia    Hypertension    PONV (postoperative nausea and vomiting)    Thyroid disease     Patient's surgical history, family medical history, social history, medications and allergies were all reviewed in Epic    Current Outpatient Medications  Medication Sig Dispense Refill   Bismuth 262 MG CHEW Chew 2 tablets (524 mg) by mouth in the morning, at noon, in the evening, and at bedtime. 120 tablet 0   Blood Glucose Monitoring Suppl (AGAMATRIX PRESTO) w/Device KIT Use as instructed for PrestoWaveSense meter.  Dispense QS for testing up to thrice daily.  Dx E11.9 1 kit 0   cyanocobalamin (VITAMIN B12) 1000 MCG/ML injection Inject 1,000 mcg into the muscle once a week. Every 8 days     ferrous gluconate (FERGON) 324 MG tablet Take 1 tablet (324 mg total) by mouth daily with breakfast. 30 tablet 0   glucose blood test strip Use as instructed for PrestoWaveSense meter.  Dispense QS for testing up to thrice daily.  Dx E11.9 100 each 0   Lancets MISC Dispense quantity sufficient for thrice daily testing. 100 each 0   levothyroxine (SYNTHROID) 88 MCG tablet Take 1 tablet (88 mcg total) by mouth daily before breakfast. 30 tablet 0   metFORMIN (GLUCOPHAGE) 1000 MG tablet Take 1 tablet (1,000 mg total) by mouth  2 (two) times daily with a meal. (Patient taking differently: Take 850 mg by mouth 3 (three) times daily.) 60 tablet 0   RELION SHORT PEN NEEDLES 31G X 8 MM MISC USE AS DIRECTED ONCE DAILY 50 each 0   TYLENOL 325 MG tablet Take 325-650 mg by mouth daily as needed for mild pain or headache.     pantoprazole (PROTONIX) 40 MG tablet Take 1 tablet (40 mg total) by mouth 2 (two) times daily. (Patient not taking: Reported on 07/28/2022) 28  tablet 0   rosuvastatin (CRESTOR) 20 MG tablet Take 1 tablet (20 mg total) by mouth daily. (Patient not taking: Reported on 07/28/2022) 30 tablet 11   sertraline (ZOLOFT) 50 MG tablet Take 1 tablet (50 mg total) by mouth daily. (Patient not taking: Reported on 07/28/2022) 30 tablet 0   No current facility-administered medications for this visit.    Physical Exam:     BP 104/60   Pulse 81   Ht 5\' 1"  (1.549 m)   Wt 115 lb (52.2 kg)   SpO2 99%   BMI 21.73 kg/m   GENERAL:  Pleasant female in NAD PSYCH: : Cooperative, normal affect NEURO: Alert and oriented x 3, no focal neurologic deficits   IMPRESSION and PLAN:    1) H. pylori gastritis - Completed quadruple therapy - Ok to discontinue pantoprazole (she has already stopped taking) given no pre-existing need for chronic acid suppression therapy - Check for H pylori eradication via [employer] panel  2) Weight loss - Weight slowly improving - Continue monitoring - Continue close follow-up with PCP and Hematology Clinic  3) Anemia 4) Thrombocytosis 5) Leukocytosis (improving) 6) B12 deficiency - Continue close follow-up in the Hematology Clinic later this week - Continue B12      Can follow-up in the GI clinic prn       Shellia Cleverly ,DO, FACG 07/28/2022, 10:32 AM

## 2022-07-28 NOTE — Patient Instructions (Addendum)
Follow up as needed.  Please return Diatherix stool sample to our lab downstairs at the basement. Press "B" on the elevator. The lab is located at the first door on the left as you exit the elevator.  _______________________________________________________  If your blood pressure at your visit was 140/90 or greater, please contact your primary care physician to follow up on this.  _______________________________________________________  If you are age 65 or older, your body mass index should be between 23-30. Your Body mass index is 21.73 kg/m. If this is out of the aforementioned range listed, please consider follow up with your Primary Care Provider.  If you are age 58 or younger, your body mass index should be between 19-25. Your Body mass index is 21.73 kg/m. If this is out of the aformentioned range listed, please consider follow up with your Primary Care Provider.   __________________________________________________________  The [facility] providers would like to encourage you to use [facility] to communicate with providers for non-urgent requests or questions.  Due to long hold times on the telephone, sending your provider a message by [facility] may be a faster and more efficient way to get a response.  Please allow 48 business hours for a response.  Please remember that this is for non-urgent requests.     Thank you for choosing me and [facility].  Vito Cirigliano, D.O.

## 2022-07-29 ENCOUNTER — Encounter: Payer: Self-pay | Admitting: Hematology

## 2022-07-30 ENCOUNTER — Inpatient Hospital Stay: Payer: Self-pay

## 2022-07-30 ENCOUNTER — Inpatient Hospital Stay: Payer: Self-pay | Admitting: Hematology

## 2022-07-30 LAB — JAK2 (INCLUDING V617F AND EXON 12), MPL,& CALR W/RFL MPN PANEL (NGS)

## 2022-07-30 LAB — BCR ABL1 FISH (GENPATH)

## 2022-07-30 NOTE — Assessment & Plan Note (Deleted)
She developed mild anemia in July 2020, but it resolved in December 2021.  Lab showed progressive anemia since April 2024, anemia workup showed low B12 at 166, low serum iron and low TIBC, normal folate, ferritin was elevated at 430, this is consistent with B12 deficiency and anemia of chronic disease -She started B12 injection in the hospital, has been receiving weekly for the past 6 weeks.  Will continue weekly injection for total of 8 weeks, then changed to monthly injection. -Will check intrinsic factor to see if she has pernicious anemia -If her anemia does not resolve after adequate B12 injection, will obtain a bone marrow biopsy, given the other abnormal CBC.

## 2022-07-30 NOTE — Progress Notes (Deleted)
[facility]   [phone] [phone]) [phone]   Clinic Follow up Note   Patient Care Team: Shelby Mattocks, DO as PCP - General (Family Medicine) Linna Darner, RD as Dietitian (Family Medicine) Malachy Mood, MD as Consulting Physician (Hematology and Oncology)  Date of Service:  07/30/2022  CHIEF COMPLAINT: f/u of Anemia, thrombocytosis and leukocytosis   CURRENT THERAPY:     ASSESSMENT: *** Brittany Pineda is a 67 y.o. female with   No problem-specific Assessment & Plan notes found for this encounter.  ***   PLAN: {Everything Dr. Mosetta Putt talks to pt about, including reviewing scans and labs. } -{proceed with ***} -{lab with/without flush and f/u when?}   SUMMARY OF ONCOLOGIC HISTORY: Oncology History   No history exists.     INTERVAL HISTORY: *** Brittany Pineda is here for a follow up of Anemia, thrombocytosis and leukocytosis. She was last seen by me on 07/17/2022. She presents to the clinic     All other systems were reviewed with the patient and are negative.  MEDICAL HISTORY:  Past Medical History:  Diagnosis Date   Diabetes mellitus    History of Helicobacter pylori infection    Hyperlipidemia    Hypertension    PONV (postoperative nausea and vomiting)    Thyroid disease     SURGICAL HISTORY: Past Surgical History:  Procedure Laterality Date   ABDOMINAL HYSTERECTOMY     BIOPSY  06/17/2022   Procedure: BIOPSY;  Surgeon: Beverley Fiedler, MD;  Location: [facility] ENDOSCOPY;  Service: Gastroenterology;;   CHOLECYSTECTOMY     COLONOSCOPY WITH PROPOFOL N/A 06/17/2022   Procedure: COLONOSCOPY WITH PROPOFOL;  Surgeon: Beverley Fiedler, MD;  Location: [facility] ENDOSCOPY;  Service: Gastroenterology;  Laterality: N/A;   ESOPHAGOGASTRODUODENOSCOPY N/A 06/17/2022   Procedure: ESOPHAGOGASTRODUODENOSCOPY (EGD);  Surgeon: Beverley Fiedler, MD;  Location: [facility] ENDOSCOPY;  Service: Gastroenterology;  Laterality: N/A;    I have reviewed the  social history and family history with the patient and they are unchanged from previous note.  ALLERGIES:  has No Known Allergies.  MEDICATIONS:  Current Outpatient Medications  Medication Sig Dispense Refill   Bismuth 262 MG CHEW Chew 2 tablets (524 mg) by mouth in the morning, at noon, in the evening, and at bedtime. 120 tablet 0   Blood Glucose Monitoring Suppl (AGAMATRIX PRESTO) w/Device KIT Use as instructed for PrestoWaveSense meter.  Dispense QS for testing up to thrice daily.  Dx E11.9 1 kit 0   cyanocobalamin (VITAMIN B12) 1000 MCG/ML injection Inject 1,000 mcg into the muscle once a week. Every 8 days     ferrous gluconate (FERGON) 324 MG tablet Take 1 tablet (324 mg total) by mouth daily with breakfast. 30 tablet 0   glucose blood test strip Use as instructed for PrestoWaveSense meter.  Dispense QS for testing up to thrice daily.  Dx E11.9 100 each 0   Lancets MISC Dispense quantity sufficient for thrice daily testing. 100 each 0   levothyroxine (SYNTHROID) 88 MCG tablet Take 1 tablet (88 mcg total) by mouth daily before breakfast. 30 tablet 0   metFORMIN (GLUCOPHAGE) 1000 MG tablet Take 1 tablet (1,000 mg total) by mouth 2 (two) times daily with a meal. (Patient taking differently: Take 850 mg by mouth 3 (three) times daily.) 60 tablet 0   pantoprazole (PROTONIX) 40 MG tablet Take 1 tablet (40 mg total) by mouth 2 (two) times daily. (Patient not taking: Reported on 07/28/2022) 28 tablet 0   RELION SHORT  PEN NEEDLES 31G X 8 MM MISC USE AS DIRECTED ONCE DAILY 50 each 0   rosuvastatin (CRESTOR) 20 MG tablet Take 1 tablet (20 mg total) by mouth daily. (Patient not taking: Reported on 07/28/2022) 30 tablet 11   sertraline (ZOLOFT) 50 MG tablet Take 1 tablet (50 mg total) by mouth daily. (Patient not taking: Reported on 07/28/2022) 30 tablet 0   TYLENOL 325 MG tablet Take 325-650 mg by mouth daily as needed for mild pain or headache.     No current facility-administered medications for this  visit.    PHYSICAL EXAMINATION: ECOG PERFORMANCE STATUS: {CHL ONC ECOG [phone]}  There were no vitals filed for this visit. Wt Readings from Last 3 Encounters:  07/28/22 115 lb (52.2 kg)  07/20/22 113 lb 9.6 oz (51.5 kg)  07/17/22 114 lb 12.8 oz (52.1 kg)    {Only keep what was examined. If exam not performed, can use .CEXAM } GENERAL:alert, no distress and comfortable SKIN: skin color, texture, turgor are normal, no rashes or significant lesions EYES: normal, Conjunctiva are pink and non-injected, sclera clear {OROPHARYNX:no exudate, no erythema and lips, buccal mucosa, and tongue normal}  NECK: supple, thyroid normal size, non-tender, without nodularity LYMPH:  no palpable lymphadenopathy in the cervical, axillary {or inguinal} LUNGS: clear to auscultation and percussion with normal breathing effort HEART: regular rate & rhythm and no murmurs and no lower extremity edema ABDOMEN:abdomen soft, non-tender and normal bowel sounds Musculoskeletal:no cyanosis of digits and no clubbing  NEURO: alert & oriented x 3 with fluent speech, no focal motor/sensory deficits  LABORATORY DATA:  I have reviewed the data as listed    Latest Ref Rng & Units 07/17/2022    4:12 PM 06/23/2022    3:18 PM 06/19/2022    1:10 AM  CBC  WBC 4.0 - 10.5 K/uL 10.8   19.8   Hemoglobin 12.0 - 15.0 g/dL 01.0  93.2  8.9   Hematocrit 36.0 - 46.0 % 31.1   26.0   Platelets 150 - 400 K/uL 709   969         Latest Ref Rng & Units 07/17/2022    4:12 PM 06/18/2022    2:09 AM 06/17/2022    1:03 AM  CMP  Glucose 70 - 99 mg/dL 355  732  202   BUN 8 - 23 mg/dL 13  10  13    Creatinine 0.44 - 1.00 mg/dL 5.42  7.06  2.37   Sodium 135 - 145 mmol/L 133  135  136   Potassium 3.5 - 5.1 mmol/L 4.4  3.5  4.4   Chloride 98 - 111 mmol/L 96  109  105   CO2 22 - 32 mmol/L 26  19  18    Calcium 8.9 - 10.3 mg/dL 9.0  7.5  8.1   Total Protein 6.5 - 8.1 g/dL 7.8     Total Bilirubin 0.3 - 1.2 mg/dL 0.5     Alkaline Phos 38 -  126 U/L 53     AST 15 - 41 U/L 15     ALT 0 - 44 U/L 9         RADIOGRAPHIC STUDIES: I have personally reviewed the radiological images as listed and agreed with the findings in the report. No results found.    No orders of the defined types were placed in this encounter.  All questions were answered. The patient knows to call the clinic with any problems, questions or concerns. No barriers to learning was detected.  The total time spent in the appointment was {CHL ONC TIME VISIT - [id number]}.     Salome Holmes, CMA 07/30/2022   I, Monica Martinez, CMA, am acting as scribe for Malachy Mood, MD.   {Add scribe attestation statement}

## 2022-07-31 ENCOUNTER — Other Ambulatory Visit: Payer: Self-pay

## 2022-07-31 ENCOUNTER — Inpatient Hospital Stay: Payer: Self-pay

## 2022-07-31 DIAGNOSIS — D531 Other megaloblastic anemias, not elsewhere classified: Secondary | ICD-10-CM

## 2022-07-31 MED ORDER — CYANOCOBALAMIN 1000 MCG/ML IJ SOLN
1000.0000 ug | Freq: Once | INTRAMUSCULAR | Status: AC
  Administered 2022-07-31: 1000 ug via INTRAMUSCULAR
  Filled 2022-07-31: qty 1

## 2022-07-31 NOTE — Patient Instructions (Signed)
Vitamin B12 Injection Qu es este medicamento? La Vitamina B12 evita y trata los niveles bajos de vitamina B12 en el cuerpo. Se [place] en personas que no reciben suficiente vitamina B12 de su dieta o cuando el tracto digestivo no absorbe suficiente cantidad. La vitamina B12 cumple una funcin importante para [employer] salud del sistema nervioso y de los glbulos rojos. Este medicamento puede ser utilizado para otros usos; si tiene alguna pregunta consulte con su proveedor de atencin mdica o con su farmacutico. MARCAS COMUNES: B-12 Compliance Kit, B-12 Injection Kit, Cyomin, Dodex, LA-12, Nutri-Twelve, Physicians EZ Use B-12, Primabalt Qu le debo informar a mi profesional de la salud antes de tomar este medicamento? Necesitan saber si usted presenta alguno de los siguientes problemas o situaciones: Enfermedad renal Enfermedad de Leber Anemia megaloblstica Una reaccin alrgica o inusual a la cianocobalamina, al cobalto, a otros medicamentos, alimentos, colorantes o conservantes Si est embarazada o buscando quedar embarazada Si est amamantando a un beb Cmo debo [employer]? Este medicamento se inyecta en un msculo o bien profundo debajo de la piel. Por lo general se administra en una clnica o en el consultorio del equipo de atencin. Sin embargo, su equipo de atencin podra ensearle cmo inyectarse usted mismo. Siga todas las instrucciones. Hable con su equipo de atencin sobre el uso de este medicamento en nios. Puede requerir atencin especial. Sobredosis: Pngase en contacto inmediatamente con un centro toxicolgico o una sala de urgencia si usted cree que haya tomado demasiado medicamento. ATENCIN: [employer] es solo para usted. No comparta este medicamento con nadie. Qu sucede si me olvido de una dosis? Si le administran la dosis en la clnica o consultorio del equipo de atencin, pida que le reprogramen su cita. Si se administra usted mismo las  inyecciones, adminstrela lo antes posible. Si es casi la hora de la prxima dosis, administre solo esa dosis. No se administre dosis adicionales o dobles. Qu puede interactuar con este medicamento? Alcohol Colchicina Puede ser que esta lista no menciona todas las posibles interacciones. Informe a su profesional de [employer] de [employer] productos a base de hierbas, medicamentos de [place] o suplementos nutritivos que est tomando. Si usted fuma, consume bebidas alcohlicas o si utiliza drogas ilegales, indqueselo tambin a su profesional de [employer]. Algunas sustancias pueden interactuar con su medicamento. A qu debo estar atento al usar PPL Corporation? Visite peridicamente a su equipo de atencin. Usted podra necesitar realizarse [employer] de sangre mientras est usando [place]ma. Es posible que deba seguir una dieta especial. Hable con su equipo de atencin. Limite su ingesta de alcohol y evite fumar para obtener el mayor beneficio. Qu efectos secundarios puedo tener al [employer] este medicamento? Efectos secundarios que debe informar a su equipo de atencin tan pronto como sea posible: Reacciones alrgicas: erupcin cutnea, comezn/picazn, urticaria, hinchazn de la cara, los labios, Scientist, product/process development o la garganta Hinchazn de los tobillos, las manos o los pies Problemas para respirar Efectos secundarios que generalmente no requieren atencin mdica (debe informarlos a su equipo de atencin si persisten o si son molestos): Diarrea Puede ser que esta lista no menciona todos los posibles efectos secundarios. Comunquese a su mdico por asesoramiento mdico [employer]. Usted puede informar los efectos secundarios a la FDA por telfono al 1-800-FDA-1088. Dnde debo guardar mi medicina? Mantenga fuera del alcance de los nios. Guarde a [employer], entre 15 y 30 grados Celsius (59 y 30 grados Fahrenheit). Proteja de Statistician. Deseche todo  el medicamento que  no haya utilizado despus de la fecha de vencimiento. ATENCIN: Este folleto es un resumen. Puede ser que no cubra toda la posible informacin. Si usted tiene preguntas acerca de esta medicina, consulte con su mdico, su farmacutico o su profesional de Radiographer, therapeutic.  2024 Elsevier/Gold Standard (2021-02-17 00:00:00)

## 2022-08-12 ENCOUNTER — Inpatient Hospital Stay: Payer: Self-pay | Admitting: Licensed Clinical Social Worker

## 2022-08-12 ENCOUNTER — Inpatient Hospital Stay: Payer: Self-pay | Attending: Hematology | Admitting: Hematology

## 2022-08-12 ENCOUNTER — Ambulatory Visit: Payer: Self-pay | Admitting: Hematology

## 2022-08-12 ENCOUNTER — Inpatient Hospital Stay: Payer: Self-pay

## 2022-08-12 ENCOUNTER — Other Ambulatory Visit: Payer: Self-pay

## 2022-08-12 VITALS — BP 123/61 | HR 89 | Temp 98.6°F | Resp 16 | Ht 61.0 in | Wt 117.2 lb

## 2022-08-12 DIAGNOSIS — E785 Hyperlipidemia, unspecified: Secondary | ICD-10-CM | POA: Insufficient documentation

## 2022-08-12 DIAGNOSIS — D75839 Thrombocytosis, unspecified: Secondary | ICD-10-CM | POA: Insufficient documentation

## 2022-08-12 DIAGNOSIS — D531 Other megaloblastic anemias, not elsewhere classified: Secondary | ICD-10-CM | POA: Insufficient documentation

## 2022-08-12 DIAGNOSIS — E119 Type 2 diabetes mellitus without complications: Secondary | ICD-10-CM | POA: Insufficient documentation

## 2022-08-12 DIAGNOSIS — Z90710 Acquired absence of both cervix and uterus: Secondary | ICD-10-CM | POA: Insufficient documentation

## 2022-08-12 DIAGNOSIS — I10 Essential (primary) hypertension: Secondary | ICD-10-CM | POA: Insufficient documentation

## 2022-08-12 DIAGNOSIS — Z79899 Other long term (current) drug therapy: Secondary | ICD-10-CM | POA: Insufficient documentation

## 2022-08-12 DIAGNOSIS — D72829 Elevated white blood cell count, unspecified: Secondary | ICD-10-CM | POA: Insufficient documentation

## 2022-08-12 DIAGNOSIS — Z9049 Acquired absence of other specified parts of digestive tract: Secondary | ICD-10-CM | POA: Insufficient documentation

## 2022-08-12 LAB — VITAMIN B12: Vitamin B-12: >7500 pg/mL — ABNORMAL HIGH (ref 180–914)

## 2022-08-12 MED ORDER — CYANOCOBALAMIN 1000 MCG/ML IJ SOLN
1000.0000 ug | Freq: Once | INTRAMUSCULAR | Status: AC
  Administered 2022-08-12: 1000 ug via INTRAMUSCULAR
  Filled 2022-08-12: qty 1

## 2022-08-12 MED ORDER — CYANOCOBALAMIN 1000 MCG/ML IJ SOLN: 1000.0000 ug | INTRAMUSCULAR | 2 refills | Status: DC

## 2022-08-12 MED ORDER — "SYRINGE 18G X 1-1/2"" 3 ML MISC": 1.0000 | 1 refills | Status: DC

## 2022-08-12 NOTE — Progress Notes (Signed)
[facility] CSW Progress Note  Visual merchandiser met with patient at request of provider to assist with resources.  Pt seen with an interpreter to facilitate conversation.  Pt states she is originally from [place] and has been in the [place] for a number of years.  Per pt when her 2 daughters were young and living with her she had an [place] card for health care; however, now that they are grown it has expired and she never renewed it.  With new health issues pt needs assistance re-applying.  Pt states one of her daughters moved to [place].  The other is local, but not able to offer much assistance.  Pt does not work and is reliant on her grandson to assist with paying rent and bills.  CSW referred pt to the [facility] to assist with applying for an [place] card as well as connecting to [employer].  Pt provided w/ a bag of food from the pantry as well as a list of local food pantries.      Rachel Moulds, LCSW Clinical Social Worker [facility]

## 2022-08-12 NOTE — Patient Instructions (Signed)
Vitamin B12 Deficiency Vitamin B12 deficiency occurs when the body does not have enough of this important vitamin. The body needs this vitamin: To make red blood cells. To make DNA. This is the genetic material inside cells. To help the nerves work properly so they can carry messages from the brain to the body. Vitamin B12 deficiency can cause health problems, such as not having enough red blood cells in the blood (anemia). This can lead to nerve damage if untreated. What are the causes? This condition may be caused by: Not eating enough foods that contain vitamin B12. Not having enough stomach acid and digestive fluids to properly absorb vitamin B12 from the food that you eat. Having certain diseases that make it hard to absorb vitamin B12. These diseases include Crohn's disease, chronic pancreatitis, and cystic fibrosis. An autoimmune disorder in which the body does not make enough of a protein (intrinsic factor) within the stomach, resulting in not enough absorption of vitamin B12. Having a surgery in which part of the stomach or small intestine is removed. Taking certain medicines that make it hard for the body to absorb vitamin B12. These include: Heartburn medicines, such as antacids and proton pump inhibitors. Some medicines that are used to treat diabetes. What increases the risk? The following factors may make you more likely to develop a vitamin B12 deficiency: Being an older adult. Eating a vegetarian or vegan diet that does not include any foods that come from animals. Eating a poor diet while you are pregnant. Taking certain medicines. Having alcoholism. What are the signs or symptoms? In some cases, there are no symptoms of this condition. If the condition leads to anemia or nerve damage, various symptoms may occur, such as: Weakness. Tiredness (fatigue). Loss of appetite. Numbness or tingling in your hands and feet. Redness and burning of the tongue. Depression,  confusion, or memory problems. Trouble walking. If anemia is severe, symptoms can include: Shortness of breath. Dizziness. Rapid heart rate. How is this diagnosed? This condition may be diagnosed with a blood test to measure the level of vitamin B12 in your blood. You may also have other tests, including: A group of tests that measure certain characteristics of blood cells (complete blood count, CBC). A blood test to measure intrinsic factor. A procedure where a thin tube with a camera on the end is used to look into your stomach or intestines (endoscopy). Other tests may be needed to discover the cause of the deficiency. How is this treated? Treatment for this condition depends on the cause. This condition may be treated by: Changing your eating and drinking habits, such as: Eating more foods that contain vitamin B12. Drinking less alcohol or no alcohol. Getting vitamin B12 injections. Taking vitamin B12 supplements by mouth (orally). Your health care provider will tell you which dose is best for you. Follow these instructions at home: Eating and drinking  Include foods in your diet that come from animals and contain a lot of vitamin B12. These include: Meats and poultry. This includes beef, pork, chicken, turkey, and organ meats, such as liver. Seafood. This includes clams, rainbow trout, salmon, tuna, and haddock. Eggs. Dairy foods such as milk, yogurt, and cheese. Eat foods that have vitamin B12 added to them (are fortified), such as ready-to-eat breakfast cereals. Check the label on the package to see if a food is fortified. The items listed above may not be a complete list of foods and beverages you can eat and drink. Contact a dietitian for   more information. Alcohol use Do not drink alcohol if: Your health care provider tells you not to drink. You are pregnant, may be pregnant, or are planning to become pregnant. If you drink alcohol: Limit how much you have to: 0-1 drink a  day for women. 0-2 drinks a day for men. Know how much alcohol is in your drink. In the [place] one drink equals one 12 oz bottle of beer (355 mL), one 5 oz glass of wine (148 mL), or one 1 oz glass of hard liquor (44 mL). General instructions Get vitamin B12 injections if told to by your health care provider. Take supplements only as told by your health care provider. Follow the directions carefully. Keep all follow-up visits. This is important. Contact a health care provider if: Your symptoms come back. Your symptoms get worse or do not improve with treatment. Get help right away: You develop shortness of breath. You have a rapid heart rate. You have chest pain. You become dizzy or you faint. These symptoms may be an emergency. Get help right away. Call 911. Do not wait to see if the symptoms will go away. Do not drive yourself to the hospital. Summary Vitamin B12 deficiency occurs when the body does not have enough of this important vitamin. Common causes include not eating enough foods that contain vitamin B12, not being able to absorb vitamin B12 from the food that you eat, having a surgery in which part of the stomach or small intestine is removed, or taking certain medicines. Eat foods that have vitamin B12 in them. Treatment may include making a change in the way you eat and drink, getting vitamin B12 injections, or taking vitamin B12 supplements. This information is not intended to replace advice given to you by your health care provider. Make sure you discuss any questions you have with your health care provider. Document Revised: 09/20/2020 Document Reviewed: 09/20/2020 Elsevier Patient Education  2024 Elsevier Inc.  

## 2022-08-12 NOTE — Assessment & Plan Note (Signed)
-  BCR/ABL PCR was negative, CML is ruled out.  Her extensive MPN panel was also negative, does not support MPN. -Could be reactive.  Given the significant low B12, will repeat her B12 adequately -Will decide if she needs a bone marrow biopsy

## 2022-08-12 NOTE — Assessment & Plan Note (Addendum)
-  She developed mild anemia in July 2020, but it resolved in December 2021.  Lab showed progressive anemia since April 2024, anemia workup showed low B12 at 166, low serum iron and low TIBC, normal folate, ferritin was elevated at 430, this is consistent with B12 deficiency and anemia of chronic disease -She started B12 injection in the hospital, and received weekly injection for total of 8 weeks, then changed to monthly injection. -Will check intrinsic factor to see if she has pernicious anemia -If her anemia does not resolve after adequate B12 injection, will obtain a bone marrow biopsy, given the other abnormal CBC.

## 2022-08-12 NOTE — Progress Notes (Unsigned)
[facility]   Telephone:(336) [phone] Fax:([phone]   Clinic Follow up Note   Patient Care Team: Shelby Mattocks, DO as PCP - General (Family Medicine) Linna Darner, RD as Dietitian (Family Medicine) Malachy Mood, MD as Consulting Physician (Hematology and Oncology)  Date of Service:  08/12/2022  CHIEF COMPLAINT: f/u of Anemia, thrombocytosis and leukocytosis   CURRENT THERAPY:  B 12 Load and Maintenance q28d  ASSESSMENT:  Brittany Pineda is a 67 y.o. female with   Vitamin B12 deficient megaloblastic anemia -She developed mild anemia in July 2020, but it resolved in December 2021.  Lab showed progressive anemia since April 2024, anemia workup showed low B12 at 166, low serum iron and low TIBC, normal folate, ferritin was elevated at 430, this is consistent with B12 deficiency and anemia of chronic disease -She started B12 injection in the hospital, and received weekly injection for total of 8 weeks, then changed to monthly injection. -Will check intrinsic factor to see if she has pernicious anemia -If her anemia does not resolve after adequate B12 injection, will obtain a bone marrow biopsy, given the other abnormal CBC.  Thrombocytosis -BCR/ABL PCR was negative, CML is ruled out.  Her extensive MPN panel was also negative, does not support MPN. -Could be reactive.  Given the significant low B12, will repeat her B12 adequately -Will decide if she needs a bone marrow biopsy    PLAN: -lab today  -I prescribe monthly  B12 injection for pt  -reviewed BCR/ABL/ PCR- negative -hold Bone marrow biopsy -lab and f/u in 3 months   SUMMARY OF ONCOLOGIC HISTORY: Oncology History   No history exists.     INTERVAL HISTORY:  Brittany Pineda is here for a follow up of Anemia, thrombocytosis and leukocytosis. She was last seen by me on 07/17/2022. She presents to the clinic accompanied by interpreter. Pt state that she feels a little better far  as her energy level.. She reports she 80% normal. Last visit her energy level was at 30-40%. Pt state that she is able to stand long periods of time no than before.    All other systems were reviewed with the patient and are negative.  MEDICAL HISTORY:  Past Medical History:  Diagnosis Date   Diabetes mellitus    History of Helicobacter pylori infection    Hyperlipidemia    Hypertension    PONV (postoperative nausea and vomiting)    Thyroid disease     SURGICAL HISTORY: Past Surgical History:  Procedure Laterality Date   ABDOMINAL HYSTERECTOMY     BIOPSY  06/17/2022   Procedure: BIOPSY;  Surgeon: Beverley Fiedler, MD;  Location: [facility] ENDOSCOPY;  Service: Gastroenterology;;   CHOLECYSTECTOMY     COLONOSCOPY WITH PROPOFOL N/A 06/17/2022   Procedure: COLONOSCOPY WITH PROPOFOL;  Surgeon: Beverley Fiedler, MD;  Location: [facility] ENDOSCOPY;  Service: Gastroenterology;  Laterality: N/A;   ESOPHAGOGASTRODUODENOSCOPY N/A 06/17/2022   Procedure: ESOPHAGOGASTRODUODENOSCOPY (EGD);  Surgeon: Beverley Fiedler, MD;  Location: [facility] ENDOSCOPY;  Service: Gastroenterology;  Laterality: N/A;    I have reviewed the social history and family history with the patient and they are unchanged from previous note.  ALLERGIES:  has No Known Allergies.  MEDICATIONS:  Current Outpatient Medications  Medication Sig Dispense Refill   Bismuth 262 MG CHEW Chew 2 tablets (524 mg) by mouth in the morning, at noon, in the evening, and at bedtime. 120 tablet 0   Blood Glucose Monitoring Suppl (AGAMATRIX PRESTO) w/Device KIT Use  as instructed for PrestoWaveSense meter.  Dispense QS for testing up to thrice daily.  Dx E11.9 1 kit 0   cyanocobalamin (VITAMIN B12) 1000 MCG/ML injection Inject 1,000 mcg into the muscle once a week. Every 8 days     ferrous gluconate (FERGON) 324 MG tablet Take 1 tablet (324 mg total) by mouth daily with breakfast. 30 tablet 0   glucose blood test strip Use as instructed for PrestoWaveSense meter.  Dispense QS  for testing up to thrice daily.  Dx E11.9 100 each 0   Lancets MISC Dispense quantity sufficient for thrice daily testing. 100 each 0   levothyroxine (SYNTHROID) 88 MCG tablet Take 1 tablet (88 mcg total) by mouth daily before breakfast. 30 tablet 0   metFORMIN (GLUCOPHAGE) 1000 MG tablet Take 1 tablet (1,000 mg total) by mouth 2 (two) times daily with a meal. (Patient taking differently: Take 850 mg by mouth 3 (three) times daily.) 60 tablet 0   pantoprazole (PROTONIX) 40 MG tablet Take 1 tablet (40 mg total) by mouth 2 (two) times daily. (Patient not taking: Reported on 07/28/2022) 28 tablet 0   RELION SHORT PEN NEEDLES 31G X 8 MM MISC USE AS DIRECTED ONCE DAILY 50 each 0   rosuvastatin (CRESTOR) 20 MG tablet Take 1 tablet (20 mg total) by mouth daily. (Patient not taking: Reported on 07/28/2022) 30 tablet 11   sertraline (ZOLOFT) 50 MG tablet Take 1 tablet (50 mg total) by mouth daily. (Patient not taking: Reported on 07/28/2022) 30 tablet 0   TYLENOL 325 MG tablet Take 325-650 mg by mouth daily as needed for mild pain or headache.     No current facility-administered medications for this visit.    PHYSICAL EXAMINATION: ECOG PERFORMANCE STATUS: {CHL ONC ECOG WU:[id number]}  Vitals:   08/12/22 1102  BP: 123/61  Pulse: 89  Resp: 16  Temp: 98.6 F (37 C)  SpO2: 100%   Wt Readings from Last 3 Encounters:  08/12/22 117 lb 3.2 oz (53.2 kg)  07/28/22 115 lb (52.2 kg)  07/20/22 113 lb 9.6 oz (51.5 kg)     GENERAL:alert, no distress and comfortable SKIN: skin color normal, no rashes or significant lesions EYES: normal, Conjunctiva are pink and non-injected, sclera clear  NEURO: alert & oriented x 3 with fluent speech  LABORATORY DATA:  I have reviewed the data as listed    Latest Ref Rng & Units 07/17/2022    4:12 PM 06/23/2022    3:18 PM 06/19/2022    1:10 AM  CBC  WBC 4.0 - 10.5 K/uL 10.8   19.8   Hemoglobin 12.0 - 15.0 g/dL 95.6  21.3  8.9   Hematocrit 36.0 - 46.0 % 31.1   26.0    Platelets 150 - 400 K/uL 709   969         Latest Ref Rng & Units 07/17/2022    4:12 PM 06/18/2022    2:09 AM 06/17/2022    1:03 AM  CMP  Glucose 70 - 99 mg/dL 086  578  469   BUN 8 - 23 mg/dL 13  10  13    Creatinine 0.44 - 1.00 mg/dL 6.29  5.28  4.13   Sodium 135 - 145 mmol/L 133  135  136   Potassium 3.5 - 5.1 mmol/L 4.4  3.5  4.4   Chloride 98 - 111 mmol/L 96  109  105   CO2 22 - 32 mmol/L 26  19  18    Calcium 8.9 - 10.3  mg/dL 9.0  7.5  8.1   Total Protein 6.5 - 8.1 g/dL 7.8     Total Bilirubin 0.3 - 1.2 mg/dL 0.5     Alkaline Phos 38 - 126 U/L 53     AST 15 - 41 U/L 15     ALT 0 - 44 U/L 9         RADIOGRAPHIC STUDIES: I have personally reviewed the radiological images as listed and agreed with the findings in the report. No results found.    Orders Placed This Encounter  Procedures   Intrinsic factor antibodies    Standing Status:   Future    Standing Expiration Date:   08/12/2023   Vitamin B12    Standing Status:   Standing    Number of Occurrences:   5    Standing Expiration Date:   08/12/2023   All questions were answered. The patient knows to call the clinic with any problems, questions or concerns. No barriers to learning was detected. The total time spent in the appointment was {CHL ONC TIME VISIT - ZOXWR:[id number]}.     Salome Holmes, CMA 08/12/2022   I, Monica Martinez, CMA, am acting as scribe for Malachy Mood, MD.   {Add scribe attestation statement}

## 2022-08-13 ENCOUNTER — Encounter: Payer: Self-pay | Admitting: Hematology

## 2022-08-14 ENCOUNTER — Telehealth: Payer: Self-pay

## 2022-08-14 ENCOUNTER — Other Ambulatory Visit: Payer: Self-pay

## 2022-08-14 LAB — INTRINSIC FACTOR ANTIBODIES: Intrinsic Factor: 16.8 AU/mL — ABNORMAL HIGH (ref 0.0–1.1)

## 2022-08-14 NOTE — Telephone Encounter (Signed)
Spoke with pt via telephone with Spanish Interpreter Service regarding coming in for lab appt.  Pt was in clinic on 08/12/2022 and had labs drawn but NO CBC w/Diff was drawn.  Dr. Mosetta Putt wants the pt to have a CBC w/Diff drawn.  Pt stated she cannot come today 08/14/2022 but can come in on 08/17/2022 at 10am.  Scheduled pt for 08/17/2022 at 10am.  Pt confirmed appt and had no further questions or concerns.  Call ended with both pt and [employer]pre[employer] Service.

## 2022-08-17 ENCOUNTER — Inpatient Hospital Stay: Payer: Self-pay

## 2022-08-17 DIAGNOSIS — D531 Other megaloblastic anemias, not elsewhere classified: Secondary | ICD-10-CM

## 2022-08-17 DIAGNOSIS — D75839 Thrombocytosis, unspecified: Secondary | ICD-10-CM

## 2022-08-17 LAB — CBC WITH DIFFERENTIAL/PLATELET
Abs Immature Granulocytes: 0.04 10*3/uL (ref 0.00–0.07)
Basophils Absolute: 0.2 10*3/uL — ABNORMAL HIGH (ref 0.0–0.1)
Basophils Relative: 2 %
Eosinophils Absolute: 0.7 10*3/uL — ABNORMAL HIGH (ref 0.0–0.5)
Eosinophils Relative: 7 %
HCT: 30.4 % — ABNORMAL LOW (ref 36.0–46.0)
Hemoglobin: 9.8 g/dL — ABNORMAL LOW (ref 12.0–15.0)
Immature Granulocytes: 0 %
Lymphocytes Relative: 18 %
Lymphs Abs: 2 10*3/uL (ref 0.7–4.0)
MCH: 29.6 pg (ref 26.0–34.0)
MCHC: 32.2 g/dL (ref 30.0–36.0)
MCV: 91.8 fL (ref 80.0–100.0)
Monocytes Absolute: 0.7 10*3/uL (ref 0.1–1.0)
Monocytes Relative: 6 %
Neutro Abs: 7.3 10*3/uL (ref 1.7–7.7)
Neutrophils Relative %: 67 %
Platelets: 584 10*3/uL — ABNORMAL HIGH (ref 150–400)
RBC: 3.31 MIL/uL — ABNORMAL LOW (ref 3.87–5.11)
RDW: 13.9 % (ref 11.5–15.5)
WBC: 10.9 10*3/uL — ABNORMAL HIGH (ref 4.0–10.5)
nRBC: 0 % (ref 0.0–0.2)

## 2022-08-17 LAB — VITAMIN B12: Vitamin B-12: 760 pg/mL (ref 180–914)

## 2022-08-19 ENCOUNTER — Telehealth: Payer: Self-pay

## 2022-08-19 NOTE — Telephone Encounter (Signed)
Enrique Sack [facility] PT with [facility] [employer] calls nurse line in regards to patients blood pressure.   She reports the patient reported to her ~ 2 weeks ago she had an episode of vision changes and high BP.   She reported to [facility] she was standing in the kitchen and became dizzy with present floaters in her left eye. She reports she checked her BP and systolic was ~190, unsure what diastolic was.   Enrique Sack reports normal BP since then and reports normal vitals at today's home visit.   I attempted to call patient to discuss and schedule an apt with a spanish interpreter. However, no answer or option for VM.  Called x2.  Will forward to PCP.

## 2022-08-21 ENCOUNTER — Other Ambulatory Visit: Payer: Self-pay | Admitting: Student

## 2022-08-21 DIAGNOSIS — E039 Hypothyroidism, unspecified: Secondary | ICD-10-CM

## 2022-08-21 DIAGNOSIS — E119 Type 2 diabetes mellitus without complications: Secondary | ICD-10-CM

## 2022-08-21 DIAGNOSIS — D649 Anemia, unspecified: Secondary | ICD-10-CM

## 2022-08-21 NOTE — Telephone Encounter (Signed)
Called patient with interpreter. There was no answer or option to leave a voicemail  Thanks [employer]

## 2022-08-24 MED ORDER — LEVOTHYROXINE SODIUM 88 MCG PO TABS
88.0000 ug | ORAL_TABLET | Freq: Every day | ORAL | 0 refills | Status: DC
Start: 2022-08-24 — End: 2022-12-03

## 2022-08-25 ENCOUNTER — Ambulatory Visit (INDEPENDENT_AMBULATORY_CARE_PROVIDER_SITE_OTHER): Payer: Self-pay | Admitting: Family Medicine

## 2022-08-25 VITALS — BP 137/64 | HR 83 | Ht 61.0 in | Wt 120.4 lb

## 2022-08-25 DIAGNOSIS — H43392 Other vitreous opacities, left eye: Secondary | ICD-10-CM | POA: Insufficient documentation

## 2022-08-25 NOTE — Assessment & Plan Note (Signed)
Pt with new floaters in L visual field concerning for possible retinal detachment vs vitreous hemorrhage vs vitreous syneresis Referral to ophthalmology for workup (pt also due for diabetic eye exam) Return precautions reviewed

## 2022-08-25 NOTE — Progress Notes (Signed)
    SUBJECTIVE:   CHIEF COMPLAINT / HPI:   Floaters in vision -10 days ago, was cooking and saw black lines with small spots in her L field of vision -notices these spots/lines primarily in middle of her field of vision -felt she had high blood pressure at the time, checked and it was up to 190 systolic -stopped taking blood pressure medications 2 months ago as recommended by PCP -has been having a mild headache for about 2-3 days, says this is new for her. Headaches last just a few minutes, rates as 2-3/10 in quantity -has not taken any medication for her headache -no blurred vision -no chest pain -no dyspnea -checks her blood pressure daily, usually less than 120 at home  PERTINENT  PMH / PSH: reviewed  OBJECTIVE:   BP 137/64   Pulse 83   Ht 5\' 1"  (1.549 m)   Wt 120 lb 6.4 oz (54.6 kg)   SpO2 100%   BMI 22.75 kg/m     Physical Exam Constitutional:      Appearance: Normal appearance.  Eyes:     General: Lids are normal. Vision grossly intact. Gaze aligned appropriately.     Extraocular Movements: Extraocular movements intact.     Conjunctiva/sclera: Conjunctivae normal.     Pupils: Pupils are equal, round, and reactive to light.  Cardiovascular:     Rate and Rhythm: Normal rate and regular rhythm.     Heart sounds: Normal heart sounds, S1 normal and S2 normal.  Pulmonary:     Effort: Pulmonary effort is normal.     Breath sounds: Normal breath sounds and air entry.  Neurological:     Mental Status: She is alert.      ASSESSMENT/PLAN:   Floaters in visual field, left Pt with new floaters in L visual field concerning for possible retinal detachment vs vitreous hemorrhage vs vitreous syneresis Referral to ophthalmology for workup (pt also due for diabetic eye exam) Return precautions reviewed    Patient Instructions  Thank you for coming in today!  Things we discussed today: I have made a referral to the ophthalmologists for you to be seen. You should call  them to schedule an appointment. [facility] [phone]. Please let us know if you need hellp with this 2. If you develop new symptoms or your symptoms get worse, please come back to our office or go to the ED for help.  Have a great day!  Gracias por venir hoy!  Cosas que discutimos hoy: 1. He hecho una derivacin a los oftalmlogos para que te [place]. Debe llamarlos para programar una cita. [employer] [facility] con [place] [phone]. Por favor, hganos saber si necesita ayuda con esto. 2. Si desarrolla nuevos sntomas o sus sntomas empeoran, regrese a Radiation protection practitioner o vaya al servicio de urgencias para obtener ayuda.  Qu tengas un lindo da!  Lorayne Bender, MD [facility]

## 2022-08-25 NOTE — Patient Instructions (Addendum)
Thank you for coming in today!  Things we discussed today: I have made a referral to the ophthalmologists for you to be seen. You should call them to schedule an appointment. [facility] [phone]. Please let us know if you need hellp with this 2. If you develop new symptoms or your symptoms get worse, please come back to our office or go to the ED for help.  Have a great day!  Gracias por venir hoy!  Cosas que discutimos hoy: 1. He hecho una derivacin a los oftalmlogos para que te [place]. Debe llamarlos para programar una cita. [employer] [facility] [facility] [place] [phone]. Por favor, hganos saber si necesita ayuda con esto. 2. Si desarrolla nuevos sntomas o sus sntomas empeoran, regrese a Radiation protection practitioner o vaya al servicio de urgencias para obtener ayuda.  Qu tengas un lindo da!

## 2022-10-21 LAB — HM DIABETES EYE EXAM

## 2022-11-18 ENCOUNTER — Encounter: Payer: Self-pay | Admitting: Hematology

## 2022-11-18 ENCOUNTER — Inpatient Hospital Stay (HOSPITAL_BASED_OUTPATIENT_CLINIC_OR_DEPARTMENT_OTHER): Payer: Self-pay | Admitting: Hematology

## 2022-11-18 ENCOUNTER — Inpatient Hospital Stay: Payer: Self-pay | Attending: Hematology

## 2022-11-18 VITALS — BP 127/65 | HR 80 | Temp 98.1°F | Resp 16 | Ht 61.0 in | Wt 122.4 lb

## 2022-11-18 DIAGNOSIS — D531 Other megaloblastic anemias, not elsewhere classified: Secondary | ICD-10-CM | POA: Insufficient documentation

## 2022-11-18 DIAGNOSIS — E785 Hyperlipidemia, unspecified: Secondary | ICD-10-CM | POA: Insufficient documentation

## 2022-11-18 DIAGNOSIS — Z79890 Hormone replacement therapy (postmenopausal): Secondary | ICD-10-CM | POA: Insufficient documentation

## 2022-11-18 DIAGNOSIS — E538 Deficiency of other specified B group vitamins: Secondary | ICD-10-CM | POA: Insufficient documentation

## 2022-11-18 DIAGNOSIS — D75839 Thrombocytosis, unspecified: Secondary | ICD-10-CM

## 2022-11-18 DIAGNOSIS — Z9049 Acquired absence of other specified parts of digestive tract: Secondary | ICD-10-CM | POA: Insufficient documentation

## 2022-11-18 DIAGNOSIS — Z7984 Long term (current) use of oral hypoglycemic drugs: Secondary | ICD-10-CM | POA: Insufficient documentation

## 2022-11-18 DIAGNOSIS — R634 Abnormal weight loss: Secondary | ICD-10-CM | POA: Insufficient documentation

## 2022-11-18 DIAGNOSIS — E119 Type 2 diabetes mellitus without complications: Secondary | ICD-10-CM | POA: Insufficient documentation

## 2022-11-18 DIAGNOSIS — Z79899 Other long term (current) drug therapy: Secondary | ICD-10-CM | POA: Insufficient documentation

## 2022-11-18 DIAGNOSIS — I10 Essential (primary) hypertension: Secondary | ICD-10-CM | POA: Insufficient documentation

## 2022-11-18 DIAGNOSIS — E039 Hypothyroidism, unspecified: Secondary | ICD-10-CM | POA: Insufficient documentation

## 2022-11-18 DIAGNOSIS — Z90710 Acquired absence of both cervix and uterus: Secondary | ICD-10-CM | POA: Insufficient documentation

## 2022-11-18 LAB — CBC WITH DIFFERENTIAL/PLATELET
Abs Immature Granulocytes: 0.01 10*3/uL (ref 0.00–0.07)
Basophils Absolute: 0.2 10*3/uL — ABNORMAL HIGH (ref 0.0–0.1)
Basophils Relative: 3 %
Eosinophils Absolute: 1.1 10*3/uL — ABNORMAL HIGH (ref 0.0–0.5)
Eosinophils Relative: 15 %
HCT: 38.6 % (ref 36.0–46.0)
Hemoglobin: 13.3 g/dL (ref 12.0–15.0)
Immature Granulocytes: 0 %
Lymphocytes Relative: 27 %
Lymphs Abs: 2 10*3/uL (ref 0.7–4.0)
MCH: 30.9 pg (ref 26.0–34.0)
MCHC: 34.5 g/dL (ref 30.0–36.0)
MCV: 89.6 fL (ref 80.0–100.0)
Monocytes Absolute: 0.4 10*3/uL (ref 0.1–1.0)
Monocytes Relative: 6 %
Neutro Abs: 3.6 10*3/uL (ref 1.7–7.7)
Neutrophils Relative %: 49 %
Platelets: 234 10*3/uL (ref 150–400)
RBC: 4.31 MIL/uL (ref 3.87–5.11)
RDW: 13.6 % (ref 11.5–15.5)
WBC: 7.4 10*3/uL (ref 4.0–10.5)
nRBC: 0 % (ref 0.0–0.2)

## 2022-11-18 LAB — VITAMIN B12: Vitamin B-12: 708 pg/mL (ref 180–914)

## 2022-11-18 NOTE — Assessment & Plan Note (Signed)
-  BCR/ABL PCR was negative, CML is ruled out.  Her extensive MPN panel was also negative, does not support MPN. -Could be reactive.  Given the significant low B12, will repeat her B12 adequately -Will decide if she needs a bone marrow biopsy

## 2022-11-18 NOTE — Progress Notes (Signed)
[facility]   Telephone:(336) [phone] Fax:(336) [phone]   Clinic Follow up Note   Patient Care Team: Shelby Mattocks, DO as PCP - General (Family Medicine) Linna Darner, RD as Dietitian (Family Medicine) Malachy Mood, MD as Consulting Physician (Hematology and Oncology)  Date of Service:  11/18/2022  CHIEF COMPLAINT: f/u of anemia and thrombocytosis  CURRENT THERAPY:  B12 injection monthly at home  Oncology History   Vitamin B12 deficient megaloblastic anemia -She developed mild anemia in July 2020, but it resolved in December 2021.  Lab showed progressive anemia since April 2024, anemia workup showed low B12 at 166, low serum iron and low TIBC, normal folate, ferritin was elevated at 430, this is consistent with B12 deficiency and anemia of chronic disease -She started B12 injection in the hospital, and received weekly injection for total of 8 weeks, then changed to monthly injection. -Will check intrinsic factor to see if she has pernicious anemia -If her anemia does not resolve after adequate B12 injection, will obtain a bone marrow biopsy, given the other abnormal CBC.  Thrombocytosis -BCR/ABL PCR was negative, CML is ruled out.  Her extensive MPN panel was also negative, does not support MPN. -Could be reactive.  Given the significant low B12, will repeat her B12 adequately -Will decide if she needs a bone marrow biopsy     Assessment and Plan    B12 Deficiency Anemia and Thrombocytosis Resolved with B12 injections. Patient has positive antibody indicating poor absorption of B12, necessitating continued injections. -Continue monthly B12 injections. -Discontinue iron supplementation. -Check B12 levels today, will call if abnormal.  Weight Loss Patient concerned about weight loss. Recommended high protein, high calorie diet. -Encourage intake of high protein, high calorie foods.  Cancer screening -Recommend mammogram and cervical cancer screening. Provided  information for free screening services at [facility] program at [facility].  Plan -Lab reviewed, CBC is back to normal -Continue B12 injection monthly at home, okay to stop oral iron.  Will check iron level on next visit -Lab in 3 months for monitoring -Schedule follow-up appointment in 6 months with repeat labs.         Discussed the use of AI scribe software for clinical note transcription with the patient, who gave verbal consent to proceed.  History of Present Illness   The patient, a 67 year old female with a history of anemia and thrombocytosis, presents for a follow-up visit. She was initially diagnosed with these conditions several months ago, which were found to be related to a B12 deficiency. Since starting B12 injections, she reports feeling "pretty much" back to normal. She has been self-administering these injections at home once a month and reports no problems from the injections. She has also been taking iron supplements, but she is unsure if she needs to continue them. The patient has concerns about her weight loss and is seeking recommendations for gaining weight. She denies any other health concerns at this time.         All other systems were reviewed with the patient and are negative.  MEDICAL HISTORY:  Past Medical History:  Diagnosis Date   Diabetes mellitus    History of Helicobacter pylori infection    Hyperlipidemia    Hypertension    PONV (postoperative nausea and vomiting)    Thyroid disease     SURGICAL HISTORY: Past Surgical History:  Procedure Laterality Date   ABDOMINAL HYSTERECTOMY     BIOPSY  06/17/2022   Procedure: BIOPSY;  Surgeon: Beverley Fiedler,  MD;  Location: [facility] ENDOSCOPY;  Service: Gastroenterology;;   CHOLECYSTECTOMY     COLONOSCOPY WITH PROPOFOL N/A 06/17/2022   Procedure: COLONOSCOPY WITH PROPOFOL;  Surgeon: Beverley Fiedler, MD;  Location[facility] ENDOSCOPY;  Service: Gastroenterology;  Laterality: N/A;   ESOPHAGOGASTRODUODENOSCOPY N/A 06/17/2022    Procedure: ESOPHAGOGASTRODUODENOSCOPY (EGD);  Surgeon: Beverley Fiedler, MD;  Location: [facility] ENDOSCOPY;  Service: Gastroenterology;  Laterality: N/A;    I have reviewed the social history and family history with the patient and they are unchanged from previous note.  ALLERGIES:  has No Known Allergies.  MEDICATIONS:  Current Outpatient Medications  Medication Sig Dispense Refill   Bismuth 262 MG CHEW Chew 2 tablets (524 mg) by mouth in the morning, at noon, in the evening, and at bedtime. 120 tablet 0   Blood Glucose Monitoring Suppl (AGAMATRIX PRESTO) w/Device KIT Use as instructed for PrestoWaveSense meter.  Dispense QS for testing up to thrice daily.  Dx E11.9 1 kit 0   cyanocobalamin (VITAMIN B12) 1000 MCG/ML injection Inject 1 mL (1,000 mcg total) into the muscle every 30 (thirty) days. 1 mL 2   ferrous gluconate (FERGON) 324 MG tablet Take 1 tablet (324 mg total) by mouth daily with breakfast. 90 tablet 0   glucose blood test strip Use as instructed for PrestoWaveSense meter.  Dispense QS for testing up to thrice daily.  Dx E11.9 100 each 0   Lancets MISC Dispense quantity sufficient for thrice daily testing. 100 each 0   levothyroxine (SYNTHROID) 88 MCG tablet Take 1 tablet (88 mcg total) by mouth daily before breakfast. 90 tablet 0   metFORMIN (GLUCOPHAGE) 850 MG tablet Take 1 tablet (850 mg total) by mouth 3 (three) times daily. 270 tablet 0   pantoprazole (PROTONIX) 40 MG tablet Take 1 tablet (40 mg total) by mouth 2 (two) times daily. (Patient not taking: Reported on 07/28/2022) 28 tablet 0   RELION SHORT PEN NEEDLES 31G X 8 MM MISC USE AS DIRECTED ONCE DAILY 50 each 0   rosuvastatin (CRESTOR) 20 MG tablet Take 1 tablet (20 mg total) by mouth daily. (Patient not taking: Reported on 07/28/2022) 30 tablet 11   sertraline (ZOLOFT) 50 MG tablet Take 1 tablet (50 mg total) by mouth daily. (Patient not taking: Reported on 07/28/2022) 30 tablet 0   Syringe/Needle, Disp, (SYRINGE 3CC/18GX1-1/2") 18G  X 1-1/2" 3 ML MISC 1 Bag by Does not apply route every 30 (thirty) days. To Be used to administer B12 Injections 10 each 1   TYLENOL 325 MG tablet Take 325-650 mg by mouth daily as needed for mild pain or headache.     No current facility-administered medications for this visit.    PHYSICAL EXAMINATION: ECOG PERFORMANCE STATUS: 0 - Asymptomatic  Vitals:   11/18/22 1049  BP: 127/65  Pulse: 80  Resp: 16  Temp: 98.1 F (36.7 C)  SpO2: 100%   Wt Readings from Last 3 Encounters:  11/18/22 122 lb 6.4 oz (55.5 kg)  08/25/22 120 lb 6.4 oz (54.6 kg)  08/12/22 117 lb 3.2 oz (53.2 kg)     GENERAL:alert, no distress and comfortable SKIN: skin color, texture, turgor are normal, no rashes or significant lesions EYES: normal, Conjunctiva are pink and non-injected, sclera clear Musculoskeletal:no cyanosis of digits and no clubbing  NEURO: alert & oriented x 3 with fluent speech, no focal motor/sensory deficits          LABORATORY DATA:  I have reviewed the data as listed    Latest Ref  Rng & Units 11/18/2022   10:27 AM 08/17/2022    9:59 AM 07/17/2022    4:12 PM  CBC  WBC 4.0 - 10.5 K/uL 7.4  10.9  10.8   Hemoglobin 12.0 - 15.0 g/dL 08.6  9.8  57.8   Hematocrit 36.0 - 46.0 % 38.6  30.4  31.1   Platelets 150 - 400 K/uL 234  584  709         Latest Ref Rng & Units 07/17/2022    4:12 PM 06/18/2022    2:09 AM 06/17/2022    1:03 AM  CMP  Glucose 70 - 99 mg/dL 469  629  528   BUN 8 - 23 mg/dL 13  10  13    Creatinine 0.44 - 1.00 mg/dL 4.13  2.44  0.10   Sodium 135 - 145 mmol/L 133  135  136   Potassium 3.5 - 5.1 mmol/L 4.4  3.5  4.4   Chloride 98 - 111 mmol/L 96  109  105   CO2 22 - 32 mmol/L 26  19  18    Calcium 8.9 - 10.3 mg/dL 9.0  7.5  8.1   Total Protein 6.5 - 8.1 g/dL 7.8     Total Bilirubin 0.3 - 1.2 mg/dL 0.5     Alkaline Phos 38 - 126 U/L 53     AST 15 - 41 U/L 15     ALT 0 - 44 U/L 9         RADIOGRAPHIC STUDIES: I have personally reviewed the radiological images as  listed and agreed with the findings in the report. No results found.    Orders Placed This Encounter  Procedures   Ferritin    Standing Status:   Future    Standing Expiration Date:   11/18/2023   Iron and TIBC    Standing Status:   Future    Standing Expiration Date:   11/18/2023   All questions were answered. The patient knows to call the clinic with any problems, questions or concerns. No barriers to learning was detected. The total time spent in the appointment was 15 minutes.     Malachy Mood, MD 11/18/2022

## 2022-11-18 NOTE — Assessment & Plan Note (Signed)
-  She developed mild anemia in July 2020, but it resolved in December 2021.  Lab showed progressive anemia since April 2024, anemia workup showed low B12 at 166, low serum iron and low TIBC, normal folate, ferritin was elevated at 430, this is consistent with B12 deficiency and anemia of chronic disease -She started B12 injection in the hospital, and received weekly injection for total of 8 weeks, then changed to monthly injection. -Will check intrinsic factor to see if she has pernicious anemia -If her anemia does not resolve after adequate B12 injection, will obtain a bone marrow biopsy, given the other abnormal CBC.

## 2022-12-02 ENCOUNTER — Other Ambulatory Visit: Payer: Self-pay | Admitting: Student

## 2022-12-02 DIAGNOSIS — E039 Hypothyroidism, unspecified: Secondary | ICD-10-CM

## 2022-12-18 ENCOUNTER — Other Ambulatory Visit: Payer: Self-pay | Admitting: Student

## 2022-12-18 DIAGNOSIS — E119 Type 2 diabetes mellitus without complications: Secondary | ICD-10-CM

## 2023-02-17 ENCOUNTER — Inpatient Hospital Stay: Payer: Self-pay | Attending: Hematology

## 2023-02-17 ENCOUNTER — Other Ambulatory Visit: Payer: Self-pay | Admitting: *Deleted

## 2023-02-17 DIAGNOSIS — D531 Other megaloblastic anemias, not elsewhere classified: Secondary | ICD-10-CM | POA: Insufficient documentation

## 2023-02-17 DIAGNOSIS — Z79899 Other long term (current) drug therapy: Secondary | ICD-10-CM | POA: Insufficient documentation

## 2023-02-17 DIAGNOSIS — D75839 Thrombocytosis, unspecified: Secondary | ICD-10-CM

## 2023-02-17 DIAGNOSIS — E538 Deficiency of other specified B group vitamins: Secondary | ICD-10-CM

## 2023-02-17 LAB — CBC WITH DIFFERENTIAL/PLATELET
Abs Immature Granulocytes: 0.02 10*3/uL (ref 0.00–0.07)
Basophils Absolute: 0.1 10*3/uL (ref 0.0–0.1)
Basophils Relative: 2 %
Eosinophils Absolute: 0.4 10*3/uL (ref 0.0–0.5)
Eosinophils Relative: 6 %
HCT: 36.1 % (ref 36.0–46.0)
Hemoglobin: 12.7 g/dL (ref 12.0–15.0)
Immature Granulocytes: 0 %
Lymphocytes Relative: 18 %
Lymphs Abs: 1.3 10*3/uL (ref 0.7–4.0)
MCH: 33.1 pg (ref 26.0–34.0)
MCHC: 35.2 g/dL (ref 30.0–36.0)
MCV: 94 fL (ref 80.0–100.0)
Monocytes Absolute: 0.6 10*3/uL (ref 0.1–1.0)
Monocytes Relative: 8 %
Neutro Abs: 4.7 10*3/uL (ref 1.7–7.7)
Neutrophils Relative %: 66 %
Platelets: 354 10*3/uL (ref 150–400)
RBC: 3.84 MIL/uL — ABNORMAL LOW (ref 3.87–5.11)
RDW: 12.2 % (ref 11.5–15.5)
WBC: 7.1 10*3/uL (ref 4.0–10.5)
nRBC: 0 % (ref 0.0–0.2)

## 2023-02-17 LAB — IRON AND IRON BINDING CAPACITY (CC-WL,HP ONLY)
Iron: 84 ug/dL (ref 28–170)
Saturation Ratios: 31 % (ref 10.4–31.8)
TIBC: 273 ug/dL (ref 250–450)
UIBC: 189 ug/dL (ref 148–442)

## 2023-02-17 LAB — FERRITIN: Ferritin: 32 ng/mL (ref 11–307)

## 2023-02-17 LAB — VITAMIN B12: Vitamin B-12: 470 pg/mL (ref 180–914)

## 2023-03-08 ENCOUNTER — Other Ambulatory Visit: Payer: Self-pay | Admitting: Student

## 2023-03-08 DIAGNOSIS — I10 Essential (primary) hypertension: Secondary | ICD-10-CM

## 2023-03-12 ENCOUNTER — Other Ambulatory Visit: Payer: Self-pay | Admitting: Student

## 2023-03-12 DIAGNOSIS — I10 Essential (primary) hypertension: Secondary | ICD-10-CM

## 2023-03-20 ENCOUNTER — Other Ambulatory Visit: Payer: Self-pay | Admitting: Student

## 2023-03-20 DIAGNOSIS — I10 Essential (primary) hypertension: Secondary | ICD-10-CM

## 2023-03-22 ENCOUNTER — Other Ambulatory Visit: Payer: Self-pay | Admitting: Student

## 2023-03-22 DIAGNOSIS — I10 Essential (primary) hypertension: Secondary | ICD-10-CM

## 2023-03-22 NOTE — Telephone Encounter (Signed)
 This was discontinued.

## 2023-05-19 ENCOUNTER — Other Ambulatory Visit: Payer: Self-pay

## 2023-05-19 ENCOUNTER — Inpatient Hospital Stay: Payer: Self-pay | Attending: Hematology

## 2023-05-19 ENCOUNTER — Inpatient Hospital Stay: Payer: Self-pay | Admitting: Hematology

## 2023-05-19 ENCOUNTER — Encounter: Payer: Self-pay | Admitting: Hematology

## 2023-05-19 VITALS — BP 132/70 | HR 83 | Temp 97.9°F | Resp 18 | Wt 147.9 lb

## 2023-05-19 DIAGNOSIS — D531 Other megaloblastic anemias, not elsewhere classified: Secondary | ICD-10-CM | POA: Insufficient documentation

## 2023-05-19 DIAGNOSIS — D649 Anemia, unspecified: Secondary | ICD-10-CM

## 2023-05-19 DIAGNOSIS — E538 Deficiency of other specified B group vitamins: Secondary | ICD-10-CM

## 2023-05-19 DIAGNOSIS — E119 Type 2 diabetes mellitus without complications: Secondary | ICD-10-CM | POA: Insufficient documentation

## 2023-05-19 DIAGNOSIS — D75839 Thrombocytosis, unspecified: Secondary | ICD-10-CM

## 2023-05-19 DIAGNOSIS — Z9049 Acquired absence of other specified parts of digestive tract: Secondary | ICD-10-CM | POA: Insufficient documentation

## 2023-05-19 DIAGNOSIS — D72829 Elevated white blood cell count, unspecified: Secondary | ICD-10-CM | POA: Insufficient documentation

## 2023-05-19 DIAGNOSIS — Z90710 Acquired absence of both cervix and uterus: Secondary | ICD-10-CM | POA: Insufficient documentation

## 2023-05-19 DIAGNOSIS — I10 Essential (primary) hypertension: Secondary | ICD-10-CM | POA: Insufficient documentation

## 2023-05-19 DIAGNOSIS — Z79899 Other long term (current) drug therapy: Secondary | ICD-10-CM | POA: Insufficient documentation

## 2023-05-19 LAB — CBC WITH DIFFERENTIAL/PLATELET
Abs Immature Granulocytes: 0.02 10*3/uL (ref 0.00–0.07)
Basophils Absolute: 0.2 10*3/uL — ABNORMAL HIGH (ref 0.0–0.1)
Basophils Relative: 2 %
Eosinophils Absolute: 0.9 10*3/uL — ABNORMAL HIGH (ref 0.0–0.5)
Eosinophils Relative: 11 %
HCT: 37.9 % (ref 36.0–46.0)
Hemoglobin: 13 g/dL (ref 12.0–15.0)
Immature Granulocytes: 0 %
Lymphocytes Relative: 23 %
Lymphs Abs: 1.9 10*3/uL (ref 0.7–4.0)
MCH: 31.3 pg (ref 26.0–34.0)
MCHC: 34.3 g/dL (ref 30.0–36.0)
MCV: 91.1 fL (ref 80.0–100.0)
Monocytes Absolute: 0.6 10*3/uL (ref 0.1–1.0)
Monocytes Relative: 8 %
Neutro Abs: 4.5 10*3/uL (ref 1.7–7.7)
Neutrophils Relative %: 56 %
Platelets: 334 10*3/uL (ref 150–400)
RBC: 4.16 MIL/uL (ref 3.87–5.11)
RDW: 12.3 % (ref 11.5–15.5)
WBC: 8.1 10*3/uL (ref 4.0–10.5)
nRBC: 0 % (ref 0.0–0.2)

## 2023-05-19 LAB — VITAMIN B12: Vitamin B-12: 429 pg/mL (ref 180–914)

## 2023-05-19 LAB — FERRITIN: Ferritin: 39 ng/mL (ref 11–307)

## 2023-05-19 MED ORDER — CYANOCOBALAMIN 1000 MCG/ML IJ SOLN
1000.0000 ug | INTRAMUSCULAR | 11 refills | Status: AC
Start: 2023-05-19 — End: ?

## 2023-05-19 NOTE — Assessment & Plan Note (Addendum)
-  BCR/ABL PCR was negative, CML is ruled out.  Her extensive MPN panel was also negative, does not support MPN. -resolved after B12 injections

## 2023-05-19 NOTE — Assessment & Plan Note (Addendum)
-  She developed mild anemia in July 2020, but it resolved in December 2021.  Lab showed progressive anemia since April 2024, anemia workup showed low B12 at 166, low serum iron and low TIBC, normal folate, ferritin was elevated at 430, this is consistent with B12 deficiency and anemia of chronic disease -She started B12 injection in the hospital, and received weekly injection for total of 8 weeks, then changed to monthly injection. 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 She does the injections at home

## 2023-05-19 NOTE — Progress Notes (Signed)
 [facility]   [phone] [phone]   Clinic Follow up Note   Patient Care Team: Shelby Mattocks, DO as PCP - General (Family Medicine) Linna Darner, RD as Dietitian (Family Medicine) Malachy Mood, MD as Consulting Physician (Hematology and Oncology)  Date of Service:  05/19/2023  CHIEF COMPLAINT: f/u of megaloblastic anemia   CURRENT THERAPY:  B12 injection monthly at home  Oncology History   Thrombocytosis -BCR/ABL PCR was negative, CML is ruled out.  Her extensive MPN panel was also negative, does not support MPN. -resolved after B12 injections   Controlled type 2 diabetes mellitus without complication, without long-term current use of insulin (HCC) -started B12 injections in 06/2022 -Leukocytosis and thrombocytosis resolved after injections.  Vitamin B12 deficient megaloblastic anemia -She developed mild anemia in July 2020, but it resolved in December 2021.  Lab showed progressive anemia since April 2024, anemia workup showed low B12 at 166, low serum iron and low TIBC, normal folate, ferritin was elevated at 430, this is consistent with B12 deficiency and anemia of chronic disease -She started B12 injection in the hospital, and received weekly injection for total of 8 weeks, then changed to monthly injection. 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 She does the injections at home   Assessment & Plan Vitamin B12 deficiency Vitamin B12 deficiency due to an antibody inhibiting B12 absorption in the stomach, previously causing abnormal blood counts, including leukocytosis, thrombocytosis, and anemia. These resolved with B12 injections. She reports improved energy levels and is comfortable with self-administering B12 injections. Oral B12 supplementation is ineffective due to absorption issues, necessitating lifelong B12 injections. - Continue monthly B12  injections at home. - Refill B12 prescription for one year at her pharmacy. - Ensure primary care physician checks B12 levels annually.  Follow-up She is self-pay without health insurance. Recommended to follow up with her primary care physician for ongoing management of B12 deficiency and other health concerns. Regular monitoring of B12 levels and cholesterol by her primary care physician is advised to minimize costs associated with specialist visits. - Follow up with primary care physician for B12 level checks and cholesterol monitoring. - Contact the clinic for any future questions or concerns. -I refilled her B12 injections, she will do self injection monthly at home   Discussed the use of AI scribe software for clinical note transcription with the patient, who gave verbal consent to proceed.  History of Present Illness The patient, a 68 year old female with a history of B12 deficiency, presents for a follow-up visit. She reports feeling good overall and has noticed an improvement in her energy levels. She has been receiving B12 injections, which have helped to normalize her previously abnormal blood counts. The patient has not noticed any new symptoms or changes in her health over the past six months. She has been managing her B12 deficiency at home with monthly injections, which she is comfortable administering herself.     All other systems were reviewed with the patient and are negative.  MEDICAL HISTORY:  Past Medical History:  Diagnosis Date   Diabetes mellitus    History of Helicobacter pylori infection    Hyperlipidemia    Hypertension    PONV (postoperative nausea and vomiting)    Thyroid disease     SURGICAL HISTORY: Past Surgical History:  Procedure Laterality Date   ABDOMINAL HYSTERECTOMY     BIOPSY  06/17/2022   Procedure: BIOPSY;  Surgeon:  Pyrtle, Carie Caddy, MD;  Location: [facility] ENDOSCOPY;  Service: Gastroenterology;;   CHOLECYSTECTOMY     COLONOSCOPY WITH PROPOFOL N/A  06/17/2022   Procedure: COLONOSCOPY WITH PROPOFOL;  Surgeon: Beverley Fiedler, MD;  Location: [facility] ENDOSCOPY;  Service: Gastroenterology;  Laterality: N/A;   ESOPHAGOGASTRODUODENOSCOPY N/A 06/17/2022   Procedure: ESOPHAGOGASTRODUODENOSCOPY (EGD);  Surgeon: Beverley Fiedler, MD;  Location: [facility] ENDOSCOPY;  Service: Gastroenterology;  Laterality: N/A;    I have reviewed the social history and family history with the patient and they are unchanged from previous note.  ALLERGIES:  has no known allergies.  MEDICATIONS:  Current Outpatient Medications  Medication Sig Dispense Refill   amlodipine-atorvastatin (CADUET) 5-80 MG tablet Take 1 tablet by mouth daily.     Bismuth 262 MG CHEW Chew 2 tablets (524 mg) by mouth in the morning, at noon, in the evening, and at bedtime. 120 tablet 0   Blood Glucose Monitoring Suppl (AGAMATRIX PRESTO) w/Device KIT Use as instructed for PrestoWaveSense meter.  Dispense QS for testing up to thrice daily.  Dx E11.9 1 kit 0   glucose blood test strip Use as instructed for PrestoWaveSense meter.  Dispense QS for testing up to thrice daily.  Dx E11.9 100 each 0   Lancets MISC Dispense quantity sufficient for thrice daily testing. 100 each 0   levothyroxine (SYNTHROID) 88 MCG tablet TAKE 1 TABLET BY MOUTH ONCE DAILY BEFORE  BREAKFAST 90 tablet 3   metFORMIN (GLUCOPHAGE) 850 MG tablet TAKE 1 TABLET BY MOUTH THREE TIMES DAILY 270 tablet 1   rosuvastatin (CRESTOR) 20 MG tablet Take 1 tablet (20 mg total) by mouth daily. 30 tablet 11   Syringe/Needle, Disp, (SYRINGE 3CC/18GX1-1/2") 18G X 1-1/2" 3 ML MISC 1 Bag by Does not apply route every 30 (thirty) days. To Be used to administer B12 Injections 10 each 1   TYLENOL 325 MG tablet Take 325-650 mg by mouth daily as needed for mild pain or headache.     cyanocobalamin (VITAMIN B12) 1000 MCG/ML injection Inject 1 mL (1,000 mcg total) into the muscle every 30 (thirty) days. 1 mL 11   ferrous gluconate (FERGON) 324 MG tablet Take 1 tablet (324  mg total) by mouth daily with breakfast. 90 tablet 0   pantoprazole (PROTONIX) 40 MG tablet Take 1 tablet (40 mg total) by mouth 2 (two) times daily. (Patient not taking: Reported on 05/19/2023) 28 tablet 0   RELION SHORT PEN NEEDLES 31G X 8 MM MISC USE AS DIRECTED ONCE DAILY (Patient not taking: Reported on 05/19/2023) 50 each 0   sertraline (ZOLOFT) 50 MG tablet Take 1 tablet (50 mg total) by mouth daily. (Patient not taking: Reported on 05/19/2023) 30 tablet 0   No current facility-administered medications for this visit.    PHYSICAL EXAMINATION: ECOG PERFORMANCE STATUS: 0 - Asymptomatic  Vitals:   05/19/23 1058  BP: 132/70  Pulse: 83  Resp: 18  Temp: 97.9 F (36.6 C)  SpO2: 98%   Wt Readings from Last 3 Encounters:  05/19/23 147 lb 14.4 oz (67.1 kg)  11/18/22 122 lb 6.4 oz (55.5 kg)  08/25/22 120 lb 6.4 oz (54.6 kg)     GENERAL:alert, no distress and comfortable SKIN: skin color, texture, turgor are normal, no rashes or significant lesions EYES: normal, Conjunctiva are pink and non-injected, sclera clear NECK: supple, thyroid normal size, non-tender, without nodularity LYMPH:  no palpable lymphadenopathy in the cervical, axillary  LUNGS: clear to auscultation and percussion with normal breathing effort HEART: regular rate &  rhythm and no murmurs and no lower extremity edema ABDOMEN:abdomen soft, non-tender and normal bowel sounds Musculoskeletal:no cyanosis of digits and no clubbing  NEURO: alert & oriented x 3 with fluent speech, no focal motor/sensory deficits  Physical Exam    LABORATORY DATA:  I have reviewed the data as listed    Latest Ref Rng & Units 05/19/2023   10:44 AM 02/17/2023   11:42 AM 11/18/2022   10:27 AM  CBC  WBC 4.0 - 10.5 K/uL 8.1  7.1  7.4   Hemoglobin 12.0 - 15.0 g/dL 16.1  09.6  04.5   Hematocrit 36.0 - 46.0 % 37.9  36.1  38.6   Platelets 150 - 400 K/uL 334  354  234         Latest Ref Rng & Units 07/17/2022    4:12 PM 06/18/2022    2:09 AM  06/17/2022    1:03 AM  CMP  Glucose 70 - 99 mg/dL 409  811  914   BUN 8 - 23 mg/dL 13  10  13    Creatinine 0.44 - 1.00 mg/dL 7.82  9.56  2.13   Sodium 135 - 145 mmol/L 133  135  136   Potassium 3.5 - 5.1 mmol/L 4.4  3.5  4.4   Chloride 98 - 111 mmol/L 96  109  105   CO2 22 - 32 mmol/L 26  19  18    Calcium 8.9 - 10.3 mg/dL 9.0  7.5  8.1   Total Protein 6.5 - 8.1 g/dL 7.8     Total Bilirubin 0.3 - 1.2 mg/dL 0.5     Alkaline Phos 38 - 126 U/L 53     AST 15 - 41 U/L 15     ALT 0 - 44 U/L 9         RADIOGRAPHIC STUDIES: I have personally reviewed the radiological images as listed and agreed with the findings in the report. No results found.    No orders of the defined types were placed in this encounter.  All questions were answered. The patient knows to call the clinic with any problems, questions or concerns. No barriers to learning was detected. The total time spent in the appointment was 15 minutes.     Malachy Mood, MD 05/19/2023

## 2023-05-19 NOTE — Assessment & Plan Note (Signed)
-  started B12 injections in 06/2022 -Leukocytosis and thrombocytosis resolved after injections.

## 2023-05-20 ENCOUNTER — Other Ambulatory Visit: Payer: Self-pay | Admitting: Hematology

## 2023-05-21 ENCOUNTER — Encounter: Payer: Self-pay | Admitting: Hematology

## 2023-05-21 ENCOUNTER — Other Ambulatory Visit: Payer: Self-pay

## 2023-06-03 ENCOUNTER — Ambulatory Visit (INDEPENDENT_AMBULATORY_CARE_PROVIDER_SITE_OTHER): Payer: Self-pay | Admitting: Student

## 2023-06-03 ENCOUNTER — Ambulatory Visit: Payer: Self-pay | Admitting: Student

## 2023-06-03 ENCOUNTER — Encounter: Payer: Self-pay | Admitting: Student

## 2023-06-03 VITALS — BP 130/65 | HR 80 | Ht 61.0 in | Wt 148.2 lb

## 2023-06-03 DIAGNOSIS — R300 Dysuria: Secondary | ICD-10-CM | POA: Insufficient documentation

## 2023-06-03 DIAGNOSIS — I10 Essential (primary) hypertension: Secondary | ICD-10-CM

## 2023-06-03 DIAGNOSIS — E039 Hypothyroidism, unspecified: Secondary | ICD-10-CM

## 2023-06-03 DIAGNOSIS — E119 Type 2 diabetes mellitus without complications: Secondary | ICD-10-CM

## 2023-06-03 DIAGNOSIS — E1142 Type 2 diabetes mellitus with diabetic polyneuropathy: Secondary | ICD-10-CM

## 2023-06-03 LAB — POCT URINALYSIS DIP (MANUAL ENTRY)
Bilirubin, UA: NEGATIVE
Glucose, UA: 100 mg/dL — AB
Ketones, POC UA: NEGATIVE mg/dL
Nitrite, UA: NEGATIVE
Protein Ur, POC: NEGATIVE mg/dL
Spec Grav, UA: <=1.005 — AB (ref 1.010–1.025)
Urobilinogen, UA: 0.2 U/dL
pH, UA: 5.5 (ref 5.0–8.0)

## 2023-06-03 LAB — POCT UA - MICROSCOPIC ONLY

## 2023-06-03 LAB — POCT GLYCOSYLATED HEMOGLOBIN (HGB A1C): HbA1c, POC (controlled diabetic range): 8.6 % — AB (ref 0.0–7.0)

## 2023-06-03 MED ORDER — LOSARTAN POTASSIUM 25 MG PO TABS: 25.0000 mg | ORAL_TABLET | Freq: Every day | ORAL | 3 refills | Status: AC

## 2023-06-03 NOTE — Assessment & Plan Note (Addendum)
 Within goal, 130/65 Given T2DM and hx proteinuria with borderline elevation, used shared decision making to start Losartan  25 mg daily. Offered observation, but patient adamant to start antihypertensive. Advised to discontinue medications she is taking from outside sources Return in 2 weeks for BMP and BP follow-up

## 2023-06-03 NOTE — Assessment & Plan Note (Signed)
 POC urine dipstick with trace blood, trace leuks Ordered urine culture and microscopy Pt afebrile, well appearing, asymptomatic at this time

## 2023-06-03 NOTE — Assessment & Plan Note (Signed)
 TSH today Adjust synthroid  as necessary

## 2023-06-03 NOTE — Progress Notes (Signed)
    SUBJECTIVE:   CHIEF COMPLAINT / HPI:   Brittany Pineda is a 68 year old female here for hypertension follow-up.  She has been taking antihypertensives she got from [place] Granddaughter says when she went to [place] , her blood pressure rose to 200s Denies any chest pain, shortness of breath, edema Does not check her blood pressure at home On chart review, she is generally well-controlled on 130s/60s to 70s  T2DM Also due for A1c. Has had proteinuria in the past, was taking an ARB.  Hyothyroidism Taking 88 mcg levothyroxine  daily Due for TSH  Hypercholesterolemia No longer taking her statin Wants to check lipid panel before resuming On review, generally at goal < 100 for LDL in the past  Dysuria Had a bout of dysuria 1 month ago Wants urine checked today because of hx UTI with development of sepsis No fever, hematuria, back pain  PERTINENT  PMH / PSH: T2DM Hyperlipidemia Iron  deficiency anemia  OBJECTIVE:   BP 130/65   Pulse 80   Ht 5\' 1"  (1.549 m)   Wt 148 lb 3.2 oz (67.2 kg)   SpO2 98%   BMI 28.00 kg/m   General: NAD, well appearing Cardiac: RRR Neuro: A&O Respiratory: normal WOB on RA. No wheezing or crackles on auscultation, good lung sounds throughout Extremities: Moving all 4 extremities equally  ASSESSMENT/PLAN:   Essential hypertension Within goal, 130/65 Given T2DM and hx proteinuria with borderline elevation, used shared decision making to start Losartan  25 mg daily. Offered observation, but patient adamant to start antihypertensive. Advised to discontinue medications she is taking from outside sources Return in 2 weeks for BMP and BP follow-up   Hypothyroidism TSH today Adjust synthroid  as necessary  Controlled type 2 diabetes mellitus without complication, without long-term current use of insulin  (HCC) A1c today  Dysuria POC urine dipstick with trace blood, trace leuks Ordered urine culture and microscopy Pt afebrile,  well appearing, asymptomatic at this time      Marielys Trinidad, DO [facility]

## 2023-06-03 NOTE — Assessment & Plan Note (Signed)
-

## 2023-06-03 NOTE — Patient Instructions (Signed)
 Checking blood work today Start Losartan  25 mg daily  Return in 2 weeks for blood pressure check with lab work- schedule this before you leave  If you have any questions or concerns, please feel free to call the clinic.   Have a wonderful day,  Dr. Vallorie Gayer [facility] [phone]

## 2023-06-04 ENCOUNTER — Encounter: Payer: Self-pay | Admitting: Student

## 2023-06-04 LAB — LIPID PANEL
Chol/HDL Ratio: 4.7 ratio — ABNORMAL HIGH (ref 0.0–4.4)
Cholesterol, Total: 236 mg/dL — ABNORMAL HIGH (ref 100–199)
HDL: 50 mg/dL (ref >39)
LDL Chol Calc (NIH): 151 mg/dL — ABNORMAL HIGH (ref 0–99)
Triglycerides: 191 mg/dL — ABNORMAL HIGH (ref 0–149)
VLDL Cholesterol Cal: 35 mg/dL (ref 5–40)

## 2023-06-04 LAB — BASIC METABOLIC PANEL WITH GFR
BUN/Creatinine Ratio: 23 (ref 12–28)
BUN: 21 mg/dL (ref 8–27)
CO2: 22 mmol/L (ref 20–29)
Calcium: 9.6 mg/dL (ref 8.7–10.3)
Chloride: 99 mmol/L (ref 96–106)
Creatinine, Ser: 0.9 mg/dL (ref 0.57–1.00)
Glucose: 213 mg/dL — ABNORMAL HIGH (ref 70–99)
Potassium: 4.8 mmol/L (ref 3.5–5.2)
Sodium: 137 mmol/L (ref 134–144)
eGFR: 71 mL/min/{1.73_m2} (ref >59)

## 2023-06-17 NOTE — Progress Notes (Signed)
  SUBJECTIVE:   CHIEF COMPLAINT / HPI:   Patient presents today to discuss her blood work from 2 weeks prior.  Hypertension: Home medications include: Losartan  25 mg daily initiated 2 weeks ago. Patient has had a BMP in the past 1 year.  Hyperlipidemia: Elevated LDL 151.  Not adherent to statin therapy.  T2DM: she has only been taking Metformin  BID rather than TID as reported. A1c was 8.6% at previous visit.   Spanish interpreter utilized throughout encounter.  PERTINENT  PMH / PSH: HTN, T2DM, HLD, hypothyroidism, vitamin B12 deficiency  OBJECTIVE:  BP 122/74   Pulse 74   Ht 5\' 1"  (1.549 m)   Wt 145 lb 12.8 oz (66.1 kg)   SpO2 97%   BMI 27.55 kg/m  Gen: well-appearing, NAD CV: RRR, no murmurs appreciable Pulm: CTAB, normal WOB  ASSESSMENT/PLAN:   Assessment & Plan Essential hypertension BP: 122/74 today. Well controlled. Goal of <140/90. Medication regimen: Losartan  25 mg daily.  Reviewed labs with patient, obtain BMP given reinitiation of losartan . Hypothyroidism, unspecified type TSH not checked at previous visit, continue Synthroid  88 mcg for now.  Titrate according to TSH this visit. Mixed hyperlipidemia Reviewed labs with patient, elevated LDL likely due to nonadherence for the past year on her statin therapy.  Restart rosuvastatin  20 mg daily.  Recheck lipid panel in 3 months. Controlled type 2 diabetes mellitus without complication, without long-term current use of insulin  (HCC) Reviewed with patient, A1c 8.6.  Due to taking metformin  only twice a day rather than 3 times daily as prescribed.  Recommend metformin  be taken accordingly.  Consider initiation of SGLT2 inhibitor in the future especially should this not improve with appropriate metformin  use. Obtain urine microalbumin/creatinine ratio. Recheck A1c in 3 months.   Asymptomatic microscopic hematuria Urine revealed small blood with 0-3 RBC.  Thought to be UTI at previous visit although now asymptomatic.  Recheck  today as previous results she is all had microscopic hematuria.  Should this continue, will need urology referral for further investigation. Health care maintenance Recommend mammogram and DEXA scan, patient deferred at this time due to lack of insurance.  Referral to [employer] for assistance with signing up for Medicare.  Return in about 3 months (around 09/18/2023) for Cholesterol and diabetes follow-up. Veronia Goon, DO 06/18/2023, 1:47 PM PGY-3, [facility]

## 2023-06-18 ENCOUNTER — Ambulatory Visit (INDEPENDENT_AMBULATORY_CARE_PROVIDER_SITE_OTHER): Payer: Self-pay | Admitting: Student

## 2023-06-18 VITALS — BP 122/74 | HR 74 | Ht 61.0 in | Wt 145.8 lb

## 2023-06-18 DIAGNOSIS — R3121 Asymptomatic microscopic hematuria: Secondary | ICD-10-CM

## 2023-06-18 DIAGNOSIS — Z0000 Encounter for general adult medical examination without abnormal findings: Secondary | ICD-10-CM

## 2023-06-18 DIAGNOSIS — I10 Essential (primary) hypertension: Secondary | ICD-10-CM

## 2023-06-18 DIAGNOSIS — E119 Type 2 diabetes mellitus without complications: Secondary | ICD-10-CM

## 2023-06-18 DIAGNOSIS — E782 Mixed hyperlipidemia: Secondary | ICD-10-CM

## 2023-06-18 DIAGNOSIS — E039 Hypothyroidism, unspecified: Secondary | ICD-10-CM

## 2023-06-18 LAB — POCT URINALYSIS DIP (MANUAL ENTRY)
Glucose, UA: NEGATIVE mg/dL
Nitrite, UA: NEGATIVE
Protein Ur, POC: 30 mg/dL — AB
Spec Grav, UA: 1.02 (ref 1.010–1.025)
Urobilinogen, UA: 0.2 U/dL
pH, UA: 5.5 (ref 5.0–8.0)

## 2023-06-18 LAB — POCT UA - MICROSCOPIC ONLY

## 2023-06-18 MED ORDER — ROSUVASTATIN CALCIUM 20 MG PO TABS: 20.0000 mg | ORAL_TABLET | Freq: Every day | ORAL | 3 refills | Status: AC

## 2023-06-18 NOTE — Assessment & Plan Note (Signed)
 Recommend mammogram and DEXA scan, patient deferred at this time due to lack of insurance.  Referral to [employer] for assistance with signing up for Medicare.

## 2023-06-18 NOTE — Assessment & Plan Note (Signed)
 Reviewed with patient, A1c 8.6.  Due to taking metformin  only twice a day rather than 3 times daily as prescribed.  Recommend metformin  be taken accordingly.  Consider initiation of SGLT2 inhibitor in the future especially should this not improve with appropriate metformin  use. Obtain urine microalbumin/creatinine ratio. Recheck A1c in 3 months.

## 2023-06-18 NOTE — Patient Instructions (Addendum)
 It was great to see you today! Thank you for choosing [facility] for your primary care.  Hoy abordamos: 1. Hipertensin: Su presin arterial se ve bien. Contine con losartn 25 mg al da. 2. Su colesterol es alto; reinicie rosuvastatina 20 mg al da. Deberamos volver a controlar su colesterol en 3 meses. 3. Diabetes: Su A1c es de 8.6, lo cual es [address]. Necesita tomar metformina 3 veces al da. Deberamos volver a controlarlo en 3 meses. 4. Le toca una mamografa para la deteccin del cncer de mama y [employer] [employer] DEXA para detectar la osteoporosis. Cuando contrate su seguro mdico, por favor, avseme para que podamos organizarlo.  Si an no lo ha hecho, Museum/gallery curator para acceder fcilmente a sus resultados de anlisis y comunicarse con su mdico de Information systems manager. Hoy estamos revisando algunos anlisis. Si son anormales, Engineer, structural. Si son normales, Transport planner un mensaje de MyChart (si est Musician) o una carta por correo. Si no recibe noticias de sus laboratorios en las prximas 2 semanas, llame a la oficina.  Today we addressed: Hypertension: Blood pressure looks great.  Continue losartan  25 mg daily. Your cholesterol is high, restart rosuvastatin  20 mg daily.  We should recheck your cholesterol in 3 months. Diabetes: Your A1c is 8.6 which is high.  You need to be taking metformin  3 times a day.  We should recheck this in 3 months. You are due for mammogram for breast cancer screening and a DEXA which screens for osteoporosis.  When you get insurance, please let me know and we can get this set up.  If you haven't already, sign up for My Chart to have easy access to your labs results, and communication with your primary care physician. We are checking some labs today. If they are abnormal, I will call you. If they are normal, I will send you a MyChart message (if it is active) or a letter in the mail. If you do not hear about your labs in the next 2 weeks, please call the office. Return in  about 3 months (around 09/18/2023) for Cholesterol and diabetes follow-up. Please arrive 15 minutes before your appointment to ensure smooth check in process.  We appreciate your efforts in making this happen.  Thank you for allowing me to participate in your care, Veronia Goon, DO 06/18/2023, 1:46 PM PGY-3, [facility]

## 2023-06-18 NOTE — Assessment & Plan Note (Signed)
 TSH not checked at previous visit, continue Synthroid  88 mcg for now.  Titrate according to TSH this visit.

## 2023-06-18 NOTE — Assessment & Plan Note (Signed)
 BP: 122/74 today. Well controlled. Goal of <140/90. Medication regimen: Losartan  25 mg daily.  Reviewed labs with patient, obtain BMP given reinitiation of losartan .

## 2023-06-18 NOTE — Assessment & Plan Note (Signed)
 Reviewed labs with patient, elevated LDL likely due to nonadherence for the past year on her statin therapy.  Restart rosuvastatin  20 mg daily.  Recheck lipid panel in 3 months.

## 2023-06-19 LAB — BASIC METABOLIC PANEL WITH GFR
BUN/Creatinine Ratio: 20 (ref 12–28)
BUN: 17 mg/dL (ref 8–27)
CO2: 23 mmol/L (ref 20–29)
Calcium: 9.6 mg/dL (ref 8.7–10.3)
Chloride: 100 mmol/L (ref 96–106)
Creatinine, Ser: 0.83 mg/dL (ref 0.57–1.00)
Glucose: 161 mg/dL — ABNORMAL HIGH (ref 70–99)
Potassium: 5.4 mmol/L — ABNORMAL HIGH (ref 3.5–5.2)
Sodium: 138 mmol/L (ref 134–144)
eGFR: 78 mL/min/{1.73_m2} (ref >59)

## 2023-06-19 LAB — TSH: TSH: 2.13 u[IU]/mL (ref 0.450–4.500)

## 2023-06-20 LAB — MICROALBUMIN / CREATININE URINE RATIO
Creatinine, Urine: 132.4 mg/dL
Microalb/Creat Ratio: 23 mg/g{creat} (ref 0–29)
Microalbumin, Urine: 29.8 ug/mL

## 2023-06-25 ENCOUNTER — Other Ambulatory Visit: Payer: Self-pay | Admitting: Student

## 2023-06-25 ENCOUNTER — Encounter: Payer: Self-pay | Admitting: Student

## 2023-06-25 ENCOUNTER — Ambulatory Visit: Payer: Self-pay | Admitting: Student

## 2023-06-25 DIAGNOSIS — E875 Hyperkalemia: Secondary | ICD-10-CM

## 2023-06-25 DIAGNOSIS — R3129 Other microscopic hematuria: Secondary | ICD-10-CM | POA: Insufficient documentation

## 2023-06-25 NOTE — Telephone Encounter (Signed)
 Attempted to call patient regarding lab work with [place] interpreter.  First call, patient did not pick up and no ability to leave voice message.  Second call patient did pick up but the call was ended while interpreter tried to introduce themselves.  Third call patient did not pick up and there is no ability to leave voice message.  Patient has microscopic hematuria and I recommend evaluation by urology.  She also has hyperkalemia, I suspect this is related to her losartan .  Recommend halving this dose to 12.5 mg daily and rechecking.  Given inability to reach patient, I will send letter with this information.  Future BMP has already been ordered.  Additionally, I will place urology referral.

## 2023-07-02 ENCOUNTER — Telehealth: Payer: Self-pay

## 2023-07-02 NOTE — Progress Notes (Unsigned)
 Complex Care Management Note Care Guide Note  07/02/2023 Name: Brittany Pineda MRN: [id number] DOB: [birth year]   Complex Care Management Outreach Attempts: An unsuccessful telephone outreach was attempted today to offer the patient information about available complex care management services. Called with [employer] ID# [phone] named [address].  Follow Up Plan:  Additional outreach attempts will be made to offer the patient complex care management information and services.   Encounter Outcome:  No Answer   Creola Doheny [facility], [facility] Guide  Direct Dial: [phone]  Fax [phone]

## 2023-07-06 NOTE — Progress Notes (Signed)
 Complex Care Management Note  Care Guide Note 07/06/2023 Name: Brittany Pineda MRN: [id number] DOB: 19-Apr-1955  Brittany Pineda is a 68 y.o. year old female who sees Veronia Goon, DO for primary care. I reached out to Ferman Houston by phone today to offer complex care management services.  Ms. July was given information about Complex Care Management services today including:   The Complex Care Management services include support from the care team which includes your Nurse Care Manager, Clinical Social Worker, or Pharmacist.  The Complex Care Management team is here to help remove barriers to the health concerns and goals most important to you. Complex Care Management services are voluntary, and the patient may decline or stop services at any time by request to their care team member.   Complex Care Management Consent Status: Patient agreed to services and verbal consent obtained.   Follow up plan:  Telephone appointment with complex care management team member scheduled for:  07-12-23  Encounter Outcome:  Patient Scheduled  Creola Doheny [facility], [facility] Guide  Direct Dial: [phone]  Fax [phone]

## 2023-07-12 ENCOUNTER — Other Ambulatory Visit: Payer: Self-pay

## 2023-07-12 NOTE — Patient Outreach (Signed)
 Complex Care Management   Visit Note  07/12/2023  Name:  Brittany Pineda MRN: [id number] DOB: 1955/11/23  Situation: Referral received for Complex Care Management related to Insurance I obtained verbal consent from Patient.  Visit completed with Patient  on the phone  Background:   Past Medical History:  Diagnosis Date   Diabetes mellitus    History of Helicobacter pylori infection    Hyperlipidemia    Hypertension    PONV (postoperative nausea and vomiting)    Thyroid  disease     Assessment: SW completed a telephone outreach with patient using a spanish interpreter for assistance with applying for Medicare. Patient states she wanorts to apply for Medicaid but does not know the difference. No income. Patient also needs resources for rent and utilities. SW and patient agreed for resources for applying for medicare and medicaid and resources for rent and utilities to be mailed to address on file.  SDOH Interventions    Flowsheet Row Patient Outreach Telephone from 07/12/2023 in [place] POPULATION HEALTH DEPARTMENT  SDOH Interventions   Housing Interventions Community Resources Provided  Financial Strain Interventions Community Resources Provided        Recommendation:   No recommendations at this time.  Follow Up Plan:   Telephone follow-up 07/28/23 at 9am  Valora Gear, BSW, [facility]  Value Based [facility] Social Worker, Population Health [phone]

## 2023-07-12 NOTE — Patient Instructions (Signed)
 Visit Information  Thank you for taking time to visit with me today. Please don't hesitate to contact me if I can be of assistance to you before our next scheduled appointment.  Our next appointment is by telephone on 07/28/23 at 9am Please call the care guide team at [phone] if you need to cancel or reschedule your appointment.   Following is a copy of your care plan:   Goals Addressed   None     Please call the Suicide and Crisis Lifeline: 988 call the [place]  National Suicide Prevention Lifeline: (779) 773-8658 or TTY: 7188012962 TTY 450-621-2990) to talk to a trained counselor call 1-800-273-TALK (toll free, 24 hour hotline) go to [facility] [address] [place] [phone]) call 911 if you are experiencing a Mental Health or Behavioral Health Crisis or need someone to talk to.  The patient verbalized understanding of instructions, educational materials, and care plan provided today and DECLINED offer to receive copy of patient instructions, educational materials, and care plan.   Valora Gear, Florestine Hurl, MHA [place]  [facility] Social Worker, Population Health [phone]

## 2023-07-20 ENCOUNTER — Encounter: Payer: Self-pay | Admitting: *Deleted

## 2023-07-28 ENCOUNTER — Other Ambulatory Visit: Payer: Self-pay

## 2023-07-28 NOTE — Patient Outreach (Signed)
 Complex Care Management   Visit Note  07/28/2023  Name:  Brittany Pineda MRN: [id number] DOB: 12-03-1955  Situation: Referral received for Complex Care Management related to Insurance I obtained verbal consent from Patient.  Visit completed with patient  on the phone  Background:   Past Medical History:  Diagnosis Date   Diabetes mellitus    History of Helicobacter pylori infection    Hyperlipidemia    Hypertension    PONV (postoperative nausea and vomiting)    Thyroid  disease     Assessment: SW completed a telephone outreach with patient using pacific interpreter, patient states she received the resources SW sent. SW and patient discussed going to DSS and asking for an interpreter. Patient understood. No other resources are needed at this time.   SDOH Interventions    Flowsheet Row Patient Outreach Telephone from 07/12/2023 in [place] POP[facility]ATION HE[facility] DEPARTMENT  SDOH Interventions   Housing Interventions Community Resources Provided  Financial Strain Interventions Community Resources Provided    Recommendation:   No recommendations at this time  Follow Up Plan:   Patient has met all care management goals. Care Management case will be closed. Patient has been provided contact information should new needs arise.   Valora Gear, Florestine Hurl, MHA [place]  [facility] Social Worker, Population Health [phone]

## 2023-07-28 NOTE — Patient Instructions (Signed)
 Visit Information  Thank you for taking time to visit with me today. Please don't hesitate to contact me if I can be of assistance to you before our next scheduled appointment.  Your next care management appointment is no further scheduled appointments.      Please call the care guide team at [phone] if you need to cancel, schedule, or reschedule an appointment.   Please call the Suicide and Crisis Lifeline: 988 call the [place]  National Suicide Prevention Lifeline: (716)377-3922 or TTY: 2052722180 TTY 4352964442) to talk to a trained counselor call 1-800-273-TALK (toll free, 24 hour hotline) go to [facility] [address] [place] [phone]) call 911 if you are experiencing a Mental Health or Behavioral Health Crisis or need someone to talk to.  Valora Gear, Florestine Hurl, MHA [place]  [facility] Social Worker, Population Health [phone]

## 2023-07-29 ENCOUNTER — Ambulatory Visit: Payer: Self-pay | Admitting: Family Medicine

## 2023-07-29 VITALS — BP 141/84 | HR 90 | Ht 61.0 in | Wt 145.2 lb

## 2023-07-29 DIAGNOSIS — R81 Glycosuria: Secondary | ICD-10-CM

## 2023-07-29 DIAGNOSIS — R300 Dysuria: Secondary | ICD-10-CM

## 2023-07-29 DIAGNOSIS — R350 Frequency of micturition: Secondary | ICD-10-CM | POA: Insufficient documentation

## 2023-07-29 DIAGNOSIS — I10 Essential (primary) hypertension: Secondary | ICD-10-CM

## 2023-07-29 LAB — POCT URINALYSIS DIP (MANUAL ENTRY)
Bilirubin, UA: NEGATIVE
Glucose, UA: >=1000 mg/dL — AB
Ketones, POC UA: NEGATIVE mg/dL
Leukocytes, UA: NEGATIVE
Nitrite, UA: NEGATIVE
Protein Ur, POC: NEGATIVE mg/dL
Spec Grav, UA: 1.01 (ref 1.010–1.025)
Urobilinogen, UA: 0.2 U/dL
pH, UA: 6 (ref 5.0–8.0)

## 2023-07-29 LAB — POCT UA - MICROSCOPIC ONLY

## 2023-07-29 MED ORDER — CEPHALEXIN 500 MG PO CAPS: 500.0000 mg | ORAL_CAPSULE | Freq: Two times a day (BID) | ORAL | 0 refills | Status: AC

## 2023-07-29 NOTE — Patient Instructions (Addendum)
 I have sent in antibiotics to take twice a day for 5 days. Let me know if you do not improve with this. We collected labs today, and I will be in touch with results. Your blood pressure was still elevated, but this could be due to pain and discomfort today. Given your history of lower blood pressures, we will hold off on further medications for now.  Le he recetado antibiticos para que los [employer] veces al da durante 5 [place]. Avseme si no mejora. 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 Su presin arterial segua elevada, pero podra deberse al dolor y las molestias de [place]. Dado su historial de presin arterial baja, pospondremos la administracin de ms medicamentos por ahora.

## 2023-07-29 NOTE — Progress Notes (Signed)
    SUBJECTIVE:   CHIEF COMPLAINT / HPI:   Possible UTI Has been going on for 2 weeks. Having problems with urinary frequency that does not go away with urinating. No dysuria. Also with lower abdominal pain. She has had a little constipation. No fevers or back pain. The urine has a strange smell. She has had a UTI before with culture that grew E. coli. She is not sexually active.  HTN Has been taking half of her losartan  pill due to K 5.4 on recent check with PCP.  PERTINENT  PMH / PSH: Hypertension, H. pylori gastritis, hypothyroidism, T2DM with polyneuropathy, microscopic hematuria status post referral to urology, HLD, depression, vitamin B-12 deficiency megaloblastic anemia  OBJECTIVE:   BP (!) 141/84   Pulse 90   Ht 5' 1 (1.549 m)   Wt 145 lb 3.2 oz (65.9 kg)   SpO2 98%   BMI 27.44 kg/m   General: Alert and oriented, in NAD Skin: Warm, dry, and intact without lesions HEENT: NCAT, EOM grossly normal, midline nasal septum Cardiac: RRR, no m/r/g appreciated Respiratory: CTAB, breathing and speaking comfortably on RA Abdominal: Soft, mild suprapubic tenderness, nondistended, normoactive bowel sounds Extremities: Moves all extremities grossly equally Neurological: No gross focal deficit Psychiatric: Appropriate mood and affect  ASSESSMENT/PLAN:   Assessment & Plan Urinary frequency Symptoms of urinary frequency with suprapubic tenderness likely UTI. Previously grown E. Coli in the past.  UA collected with small blood though no RBCs on microscopic exam.  She also has glucosuria, as well.  Urine culture sent.  Will send in keflex  500 mg BID for 5 days. Follow up as needed; if not improved, consider other infectious causes of lower abdominal tenderness/urinary frequency given patient's mild eosinophilia on recent CBC. Essential hypertension Uncontrolled on recheck.  Continue losartan  12.5 mg daily given hyperkalemia.  We are rechecking BMP today.  Appears she was also on  amlodipine  in the past; however, she did have softer blood pressures and was discontinued.  Therefore, advised follow-up with PCP for further titration of hypertension medications as needed. Glucosuria Glucose on UA greater than 1000.  UA 1 month ago was negative.  A1c 2 month ago was 8.6.  She is currently on metformin  850 3 times daily.  Will send letter to patient advising close follow-up with PCP for diabetic control, as well.   Genetta Kenning, MD [facility]

## 2023-07-29 NOTE — Assessment & Plan Note (Addendum)
 Symptoms of urinary frequency with suprapubic tenderness likely UTI. Previously grown E. Coli in the past.  UA collected with small blood though no RBCs on microscopic exam.  She also has glucosuria, as well.  Urine culture sent.  Will send in keflex  500 mg BID for 5 days. Follow up as needed; if not improved, consider other infectious causes of lower abdominal tenderness/urinary frequency given patient's mild eosinophilia on recent CBC.

## 2023-07-29 NOTE — Assessment & Plan Note (Signed)
 Uncontrolled on recheck.  Continue losartan  12.5 mg daily given hyperkalemia.  We are rechecking BMP today.  Appears she was also on amlodipine  in the past; however, she did have softer blood pressures and was discontinued.  Therefore, advised follow-up with PCP for further titration of hypertension medications as needed.

## 2023-07-30 LAB — BASIC METABOLIC PANEL WITH GFR
BUN/Creatinine Ratio: 16 (ref 12–28)
BUN: 17 mg/dL (ref 8–27)
CO2: 19 mmol/L — ABNORMAL LOW (ref 20–29)
Calcium: 9.6 mg/dL (ref 8.7–10.3)
Chloride: 98 mmol/L (ref 96–106)
Creatinine, Ser: 1.08 mg/dL — ABNORMAL HIGH (ref 0.57–1.00)
Glucose: 265 mg/dL — ABNORMAL HIGH (ref 70–99)
Potassium: 4.9 mmol/L (ref 3.5–5.2)
Sodium: 135 mmol/L (ref 134–144)
eGFR: 57 mL/min/{1.73_m2} — ABNORMAL LOW (ref >59)

## 2023-07-30 NOTE — Progress Notes (Signed)
 Spoke with patient concerning test results. Glucose elevated. K high-normal. Cr mildly bumped but not to range of AKI. Advised to drink fluids and be sure to take metformin  and decrease sugary food intake. Also advised to drink water to target urine color of light yellow. Discussed following up with PCP next for further diabetes and HTN management. Patient understanding and agrees with plan.

## 2023-08-02 LAB — URINE CULTURE

## 2023-08-03 NOTE — Progress Notes (Signed)
 Noted urine culture results with E. Coli susceptible to all antibiotics other than tetracyclines. Patient's course of keflex  for her UTI would adequately cover the bacteria, and no further treatment is needed unless patient is not improving.  Green team: please relay above message. Thank you!

## 2023-08-03 NOTE — Telephone Encounter (Signed)
 Spoke with [employer]. Patient picked up the keflex  prescription I sent in on 6/19. As long as patient is improving with this course, no further follow up needed from this perspective.

## 2023-08-03 NOTE — Telephone Encounter (Signed)
 Called patient and she states that she went to UC or the ER somewhere else and that they gave her Keflex .  Today is her last day on it.  She will not go to [facility] to pick up the RX that you prescribed of Keflex .  Patient would like to know what she needs to do now?  [name], CMA

## 2023-08-16 ENCOUNTER — Ambulatory Visit: Payer: Self-pay

## 2023-08-16 VITALS — BP 133/72 | HR 72 | Ht 61.0 in | Wt 143.2 lb

## 2023-08-16 DIAGNOSIS — N898 Other specified noninflammatory disorders of vagina: Secondary | ICD-10-CM

## 2023-08-16 DIAGNOSIS — R300 Dysuria: Secondary | ICD-10-CM

## 2023-08-16 DIAGNOSIS — E119 Type 2 diabetes mellitus without complications: Secondary | ICD-10-CM

## 2023-08-16 DIAGNOSIS — I10 Essential (primary) hypertension: Secondary | ICD-10-CM

## 2023-08-16 MED ORDER — METFORMIN HCL ER 500 MG PO TB24: 1000.0000 mg | ORAL_TABLET | Freq: Two times a day (BID) | ORAL | 2 refills | Status: DC

## 2023-08-16 MED ORDER — METFORMIN HCL 1000 MG PO TABS: 1000.0000 mg | ORAL_TABLET | Freq: Two times a day (BID) | ORAL | 3 refills | Status: DC

## 2023-08-16 MED ORDER — MICONAZOLE NITRATE 2 % VA CREA: 1.0000 | TOPICAL_CREAM | Freq: Every day | VAGINAL | 0 refills | Status: AC

## 2023-08-16 NOTE — Progress Notes (Signed)
    SUBJECTIVE:   CHIEF COMPLAINT / HPI:   Brittany Pineda is a 68 year old female who presents to the office today for follow-up of UTI.Pt reports all urinary issues have resolved and she has completed the entire course of Keflex  that was prescribed to her.  She does report vaginal itching since completing the antibiotics.  She denies any vaginal discharge, dysuria, hematuria, suprapubic pain, or any other complaints at this time.  This has been her third UTI in the last year.  BP: Patient has been doing well on half tablet of 25 mg losartan  since change was made in May 2025.  She reports systolic blood pressures at home in the range of 90s to 130s.  She denies any lightheadedness, dizziness, presyncope, or any other complaints at this time  DM: Patient reports difficulty taking 850 mg metformin  3 times a day.  She only takes it twice a day.  PERTINENT  PMH / PSH: Recurrent UTIs, HTN, DM  OBJECTIVE:   BP 133/72   Pulse 72   Ht 5' 1 (1.549 m)   Wt 143 lb 3.2 oz (65 kg)   SpO2 100%   BMI 27.06 kg/m    General: A&O, NAD, pleasant HEENT: No sign of trauma, EOM grossly intact Cardiac: RRR, no m/r/g Respiratory: CTAB, normal WOB, no w/c/r GI: Soft, NTTP, non-distended, no suprapubic tenderness Extremities: NTTP, no peripheral edema. Neuro: Normal gait, moves all four extremities appropriately. Psych: Appropriate mood and affect   ASSESSMENT/PLAN:   Assessment & Plan Dysuria Patient reports completion of antibiotic course and resolution of symptoms. Vaginal itching Vaginal itching without discharge since completion of antibiotics.  Likely secondary to antibiotic use. -Begin miconazole  2% vaginal cream for 7 days Essential hypertension Originally started on 25 mg tablet of losartan  which has been titrated down to 12.5 mg due to hyperkalemia in May 2025.  Patient has been doing well on this dose with systolic blood pressures ranging from 90s to 130s.   -Continue 12.5 mg of losartan  at this  time Controlled type 2 diabetes mellitus without complication, without long-term current use of insulin  (HCC) Patient reports difficulty taking 850 mg 3 times daily metformin  as prescribed and is only taking 850 mg twice a day..  Discussed decreasing pill burden, patient is agreeable. -Discontinue 850 mg metformin  3 times daily -Begin 1000 mg metformin  1 tablet twice daily     Camie Dixons, DO [facility]

## 2023-08-16 NOTE — Assessment & Plan Note (Signed)
 Originally started on 25 mg tablet of losartan  which has been titrated down to 12.5 mg due to hyperkalemia in May 2025.  Patient has been doing well on this dose with systolic blood pressures ranging from 90s to 130s.   -Continue 12.5 mg of losartan  at this time

## 2023-08-16 NOTE — Assessment & Plan Note (Signed)
 Patient reports difficulty taking 850 mg 3 times daily metformin  as prescribed and is only taking 850 mg twice a day..  Discussed decreasing pill burden, patient is agreeable. -Discontinue 850 mg metformin  3 times daily -Begin 1000 mg metformin  1 tablet twice daily

## 2023-08-16 NOTE — Assessment & Plan Note (Signed)
 Patient reports completion of antibiotic course and resolution of symptoms.

## 2023-08-16 NOTE — Patient Instructions (Addendum)
 It was wonderful to see you today.  Please bring ALL of your medications with you to every visit.   Today we talked about:  - You have a vaginal yeast infection, most likely due to your recent antibiotic use.  I have sent in a miconazole  cream to the pharmacy.  Use for 7 days. - Continue to take your half tablet of losartan . - We changed your diabetes medication metformin  to 1000 mg twice a day.  Please pick up the new prescription at the pharmacy.  Hoy hablamos sobre: -Tienes una infeccin vaginal por hongos. Compra la crema vaginal de miconazol (Monistat ) en la farmacia. sala durante 7 das. Llama si los sntomas persisten o empeoran. -Contine tomando 1/2 tableta de su medicamento para la presin arterial, losartn (Cozaar ). -Su medicamento para la diabetes se cambi a una tableta de 1000 mg [employer]. Por favor, recjala en la farmacia. -Por favor, haga un seguimiento con nuestra clnica en 3 meses o antes si es necesario.  Thank you for choosing [facility].   Please call [phone] with any questions about today's appointment.  Please arrive at least 15 minutes prior to your scheduled appointments.   If you had blood work today, I will send you a MyChart message or a letter if results are normal. Otherwise, I will give you a call.   If you had a referral placed, they will call you to set up an appointment. Please give us  a call if you don't hear back in the next 2 weeks.   If you need additional refills before your next appointment, please call your pharmacy first.   You should follow up in our clinic in 19-months or earlier if needed.  Camie Dixons, DO Family Medicine   It was great to see you again today.

## 2023-09-13 ENCOUNTER — Other Ambulatory Visit: Payer: Self-pay

## 2023-09-13 DIAGNOSIS — E039 Hypothyroidism, unspecified: Secondary | ICD-10-CM

## 2023-09-13 MED ORDER — LEVOTHYROXINE SODIUM 88 MCG PO TABS: 88.0000 ug | ORAL_TABLET | Freq: Every day | ORAL | 3 refills | Status: AC

## 2023-09-13 NOTE — Telephone Encounter (Signed)
 Patient calls nurse line requesting a refill on Levothyroxine .   She reports she picked up her last refill, however she lost the bottle over the weekend.   I called the pharmacy and they report they will be able to dispense her a 90 day supply for ten dollars.   Will forward to PCP to send in.

## 2024-01-12 ENCOUNTER — Other Ambulatory Visit: Payer: Self-pay

## 2024-01-13 NOTE — Telephone Encounter (Signed)
 Will refill metformin  for now. Admin team: can we get her scheduled for T2DM check up? Thanks!
